# Patient Record
Sex: Male | Born: 1945
Health system: Southern US, Community
[De-identification: ages and names within clinical notes are randomized; demographics above are authoritative.]

## PROBLEM LIST (undated history)

## (undated) DIAGNOSIS — Z973 Presence of spectacles and contact lenses: Secondary | ICD-10-CM

## (undated) DIAGNOSIS — Z77098 Contact with and (suspected) exposure to other hazardous, chiefly nonmedicinal, chemicals: Secondary | ICD-10-CM

## (undated) DIAGNOSIS — F419 Anxiety disorder, unspecified: Secondary | ICD-10-CM

## (undated) DIAGNOSIS — K649 Unspecified hemorrhoids: Secondary | ICD-10-CM

## (undated) DIAGNOSIS — F32A Depression, unspecified: Secondary | ICD-10-CM

## (undated) DIAGNOSIS — H3530 Unspecified macular degeneration: Secondary | ICD-10-CM

## (undated) DIAGNOSIS — K219 Gastro-esophageal reflux disease without esophagitis: Secondary | ICD-10-CM

## (undated) DIAGNOSIS — K861 Other chronic pancreatitis: Secondary | ICD-10-CM

## (undated) DIAGNOSIS — D179 Benign lipomatous neoplasm, unspecified: Secondary | ICD-10-CM

## (undated) DIAGNOSIS — E785 Hyperlipidemia, unspecified: Secondary | ICD-10-CM

## (undated) DIAGNOSIS — E119 Type 2 diabetes mellitus without complications: Secondary | ICD-10-CM

## (undated) DIAGNOSIS — H35039 Hypertensive retinopathy, unspecified eye: Secondary | ICD-10-CM

## (undated) DIAGNOSIS — Z9189 Other specified personal risk factors, not elsewhere classified: Secondary | ICD-10-CM

## (undated) DIAGNOSIS — Z860101 Personal history of adenomatous and serrated colon polyps: Secondary | ICD-10-CM

## (undated) DIAGNOSIS — I10 Essential (primary) hypertension: Secondary | ICD-10-CM

## (undated) DIAGNOSIS — N401 Enlarged prostate with lower urinary tract symptoms: Secondary | ICD-10-CM

## (undated) DIAGNOSIS — C61 Malignant neoplasm of prostate: Secondary | ICD-10-CM

## (undated) DIAGNOSIS — F329 Major depressive disorder, single episode, unspecified: Secondary | ICD-10-CM

## (undated) DIAGNOSIS — Z86010 Personal history of colonic polyps: Secondary | ICD-10-CM

## (undated) DIAGNOSIS — C189 Malignant neoplasm of colon, unspecified: Secondary | ICD-10-CM

## (undated) DIAGNOSIS — Z794 Long term (current) use of insulin: Secondary | ICD-10-CM

## (undated) DIAGNOSIS — G629 Polyneuropathy, unspecified: Secondary | ICD-10-CM

## (undated) HISTORY — PX: COLONOSCOPY: SHX174

## (undated) HISTORY — DX: Contact with and (suspected) exposure to other hazardous, chiefly nonmedicinal, chemicals: Z77.098

## (undated) HISTORY — DX: Hypertensive retinopathy, unspecified eye: H35.039

## (undated) HISTORY — DX: Benign lipomatous neoplasm, unspecified: D17.9

## (undated) HISTORY — DX: Anxiety disorder, unspecified: F41.9

## (undated) HISTORY — DX: Hyperlipidemia, unspecified: E78.5

## (undated) HISTORY — PX: CATARACT EXTRACTION: SUR2

## (undated) HISTORY — PX: EYE SURGERY: SHX253

## (undated) HISTORY — DX: Gastro-esophageal reflux disease without esophagitis: K21.9

## (undated) HISTORY — DX: Polyneuropathy, unspecified: G62.9

## (undated) HISTORY — DX: Essential (primary) hypertension: I10

## (undated) HISTORY — DX: Unspecified macular degeneration: H35.30

## (undated) HISTORY — PX: PROSTATE BIOPSY: SHX241

---

## 1998-07-27 DIAGNOSIS — K861 Other chronic pancreatitis: Secondary | ICD-10-CM

## 1998-07-27 HISTORY — DX: Other chronic pancreatitis: K86.1

## 2004-07-27 HISTORY — PX: NASAL SEPTUM SURGERY: SHX37

## 2011-10-15 ENCOUNTER — Encounter: Payer: Self-pay | Admitting: Dietician

## 2011-10-15 ENCOUNTER — Encounter: Payer: BC Managed Care – PPO | Attending: Endocrinology | Admitting: Dietician

## 2011-10-15 DIAGNOSIS — Z713 Dietary counseling and surveillance: Secondary | ICD-10-CM | POA: Insufficient documentation

## 2011-10-15 DIAGNOSIS — E119 Type 2 diabetes mellitus without complications: Secondary | ICD-10-CM | POA: Insufficient documentation

## 2011-10-15 NOTE — Patient Instructions (Addendum)
   Use your food label when available.  Plan to practice measuring more of the fat servings and the carb servings.  Try to keep a record for 2 week days and one weekend day of your carb intake, insulin use, glucose levels and exercise.  Keep in a notebook or a spread sheet set-up.  This will help you get back to see what is really going on with your food and your insulin needs.  You can mail or e-mail me the counts and I will be happy to give you feedback.  Use the calorieking web site for counting carbs.  Find out if your pump needs to be re-programed, and what parameters that Dr. Talmage Nap feels you need installed.  Ms. Jenita Seashore CDE is our pump trainer and is familiar with your pump and could assist with helping you if needed.  [phone])  Get a dilated eye examination for a new baseline.  When getting the new program put into the pump, explore if the entries in your food data base can be up dated to better fit your current needs.  Look to keep your weight at 190-200 lb.  You need some cushion for illness should you have a problem.  I enjoyed working with you this morning.  Let me know if I can be of further assistance.  I apologize for not getting you this carb counting guide as I promise.  I'm including it in the envelope.  Take care, Anthony Icis Budreau, RN, RD, CDE

## 2011-10-15 NOTE — Progress Notes (Signed)
Medical Nutrition Therapy:  Appt start time: 0800 end time:  0900   Assessment:  Primary concerns today: Wanting to review carb counting and do some refresher work to help with better insulin use and glucose control. History of probable type 1.5 DM since 2000.   Has been on an insulin pump for a number of years.  Using an Elmo pump.  He is not sure of his current pump settings.  He is unaware of his insulin to carb ratio, he depends on the pump to provide the needed amount of insulin at basal rates and for bolus. Notes he currently is using about 40 units of Novolog per day.  He had an incident in the recent past where when he was filling the reservoir, during the priming phase, the pump rated accelerated at a "run away rate".  Animas replace his pump with a new pump, but he notes that his pump was never programed as was the first pump.  He reports that Dr. Talmage Nap related he was not getting enough insulin.  He feels he needs to have his pump programed to fit his current needs.  At [facility], Bev Paddock CDE can assist with getting his settings programed in if need be. It appears that he could benefit with having different programs to meet his changing level of activity. 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  He removes the pump while working or playing golf to prevent this from happening.   He comes to day for a review of carbohydrate counting with his wife. Reports that in the past, his endocrinologist in [place], had helped him to maintain his A1C at 6.5%.   His endocrinologist left the [place] and his new endocrinologist appeared to not care as much and was not as supportive, and he has let his diabetes slide. He is now ready and willing to work with Dr Talmage Nap to get back on track.  His wife is  is new in his life and has not been educated regarding diabetes and how it affects the patient.  Some time was spent catching her up on the diabetes process. Has increased  level of GERD.  Makes one wonder if he has some gastroparesis taking place. He reports peripheral neuropathy.  Has not had a dilated eye exam in the last 2 years.  Encouraged him to obtain a new baseline.  MEDICATIONS: Diabetes med is the Novolog insulin per his Animas Pump  DIETARY INTAKE:  24-hr recall:  B (8-9:00 AM): toast 1-2 slices, (c/o nausea in the morning and will eat 1 to 2 slices just so he can walk) variety of breads, usually plain, sometimes margarine.  Tea or coffee, plain with the sweet and low and non-dairy creamer.  Snk (mid AM) :fiber bar or sandwich of lettuce, tomato, balogona, or leftover meat and mayo.  L (late PM): Usually not unless working with friend.  Restaurant/fast food, [employer] roast beef with chedder cheese, mushrooms, [employer] sauce, curley fries, (medium) and a diet drink  " Uses more salt on his food than he probably should".  Snk (mid PM): 1/2 sandwich of leftover meat(meatloaf, fish, baked or fried) D (5:30-7:00 PM): chicken soup (healthey choice with mushrooms) salad with lettuce tomato,carrots.  Usually fried chicken (dark meat, leg and thigh)  Green beans1.5 cup, corn 1/2 cup, mash potatoes 1/2 cup, bread high fiber 1 slice. Snk (HS PM): diet soda or water Beverages: water, coffee, diet soda, tea.  No sugared beverages.  Recent physical activity: walks daily usually about 90 minutes when the weather is good.  Will cut it short on colder days.  Also active doing projects around the house and in the yard.  Likes to play golf as often as possible.  In the warmer months will play for 3-4 hours straight on 2-3 days per week.  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   Nutritional Diagnosis:  [place]-2.1 Inpaired nutrition utilization  As related to glucose.  As evidenced by diagnosis of type 1 diabetes, use of insulin pump and A1C of 7.7%.    Intervention:  Nutrition Completed a review of carbohydrate counting.  Emphasis placed on regularly going back to measure portions for accuracy.  Use the food label when available.  Use the web site calorieking.com for referencing.  Look to see if the carb counts stored in his pump from the fast food sites can be up dated.    Handouts given during visit include:  Living Well with Diabetes  Yellow card with his carb prescription based on his eating pattern and activity.  Booklet for Carbohydrate Counting.  Monitoring/Evaluation:  Dietary intake, exercise, blood glucose levels, and body weight as needed.Plan to e-mail me his carb counting records.  If he needs help with the programming his pump, we can arrange for an appointment with Ms Jenita Seashore CDE

## 2012-04-21 ENCOUNTER — Other Ambulatory Visit: Payer: Self-pay | Admitting: Internal Medicine

## 2012-04-21 DIAGNOSIS — R945 Abnormal results of liver function studies: Secondary | ICD-10-CM

## 2012-04-29 ENCOUNTER — Ambulatory Visit
Admission: RE | Admit: 2012-04-29 | Discharge: 2012-04-29 | Disposition: A | Discharge status: Home / Self Care | Payer: BC Managed Care – PPO | Source: Ambulatory Visit | Attending: Internal Medicine | Admitting: Internal Medicine

## 2012-04-29 DIAGNOSIS — R945 Abnormal results of liver function studies: Secondary | ICD-10-CM

## 2012-04-29 IMAGING — US US ABDOMEN LIMITED
1 series · 14 of 25 positions shown · non-contrast
Comparison: CT scan [DATE]

CLINICAL DATA: Abnormal LFTs

ABDOMEN ULTRASOUND LIMITED
TECHNIQUE: Right upper quadrant ultrasound

[Series 1: us abdomen limited · 0.20mm/px · 14 of 51 slices shown]
[im 1/51]
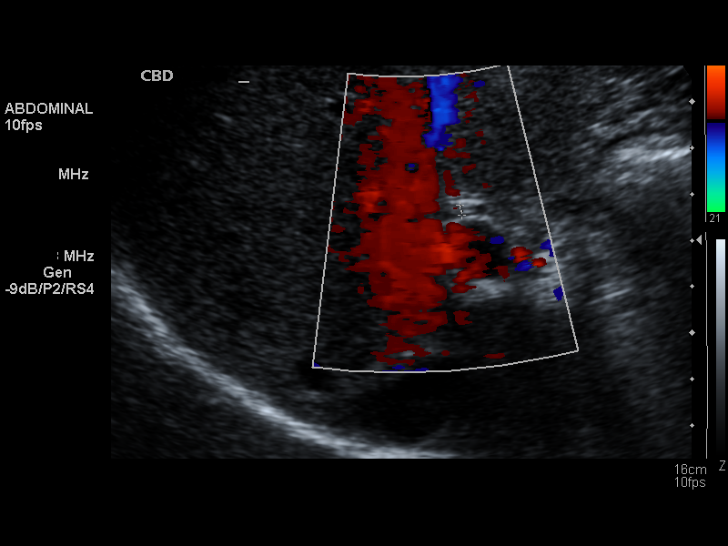
[im 5/51]
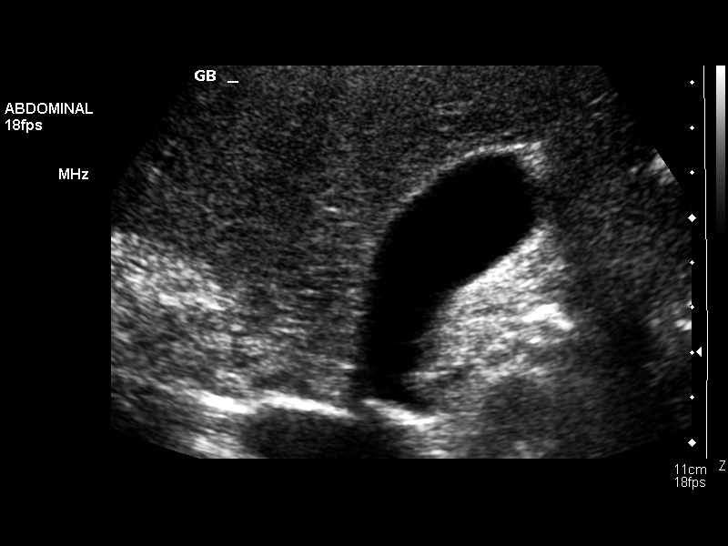
[im 9/51]
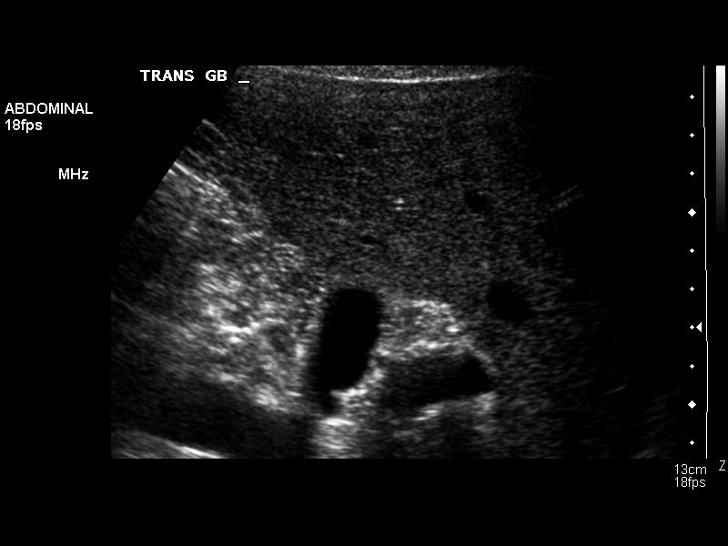
[im 13/51]
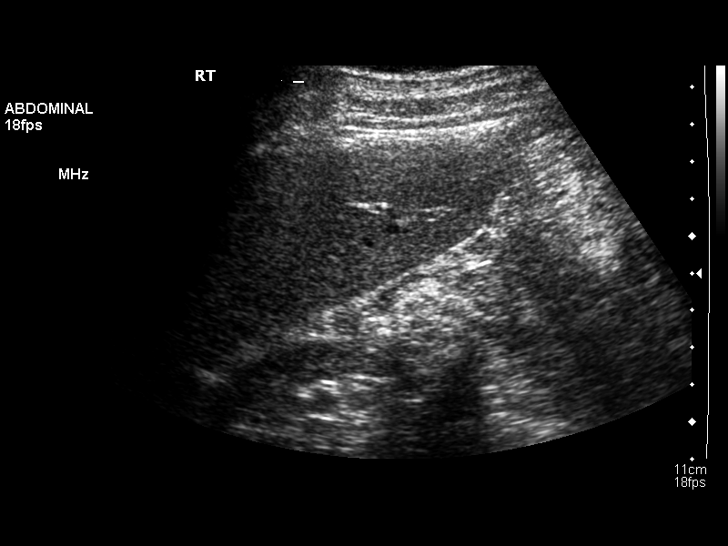
[im 17/51]
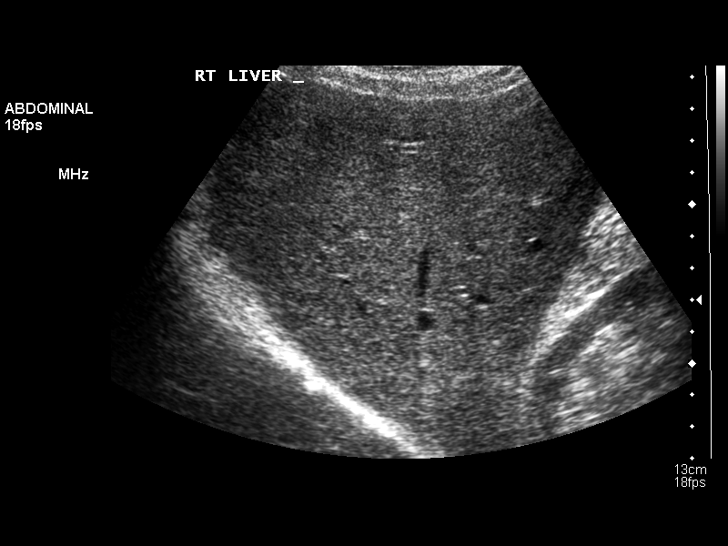
[im 19/51]
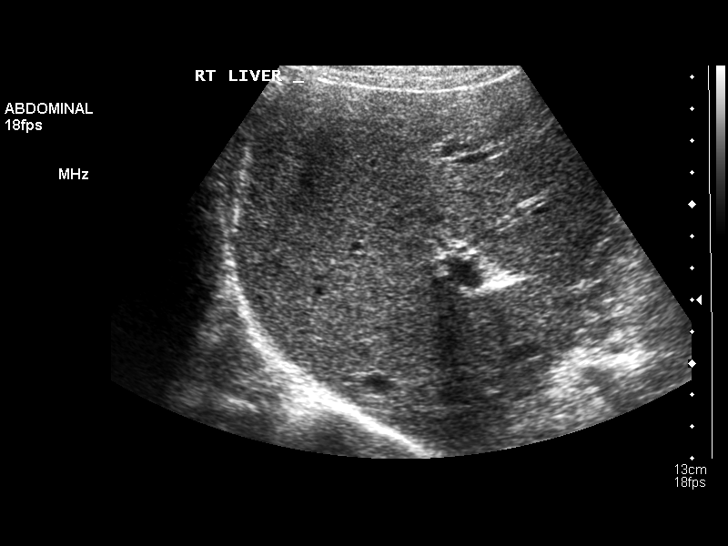
[im 23/51]
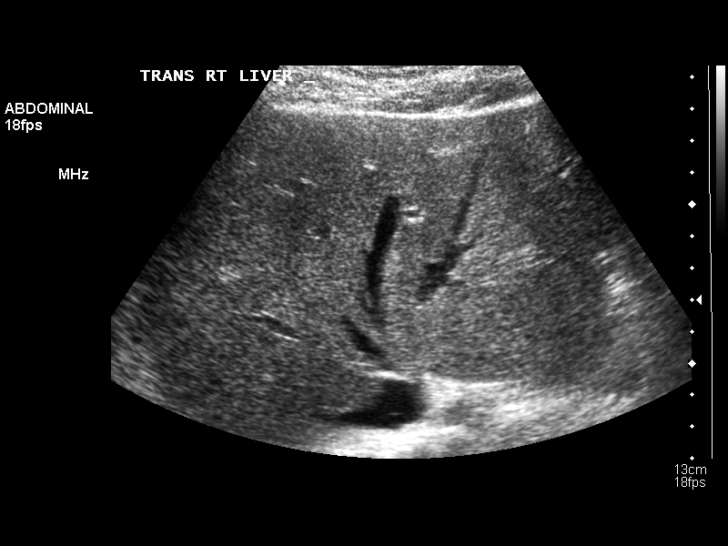
[im 28/51]
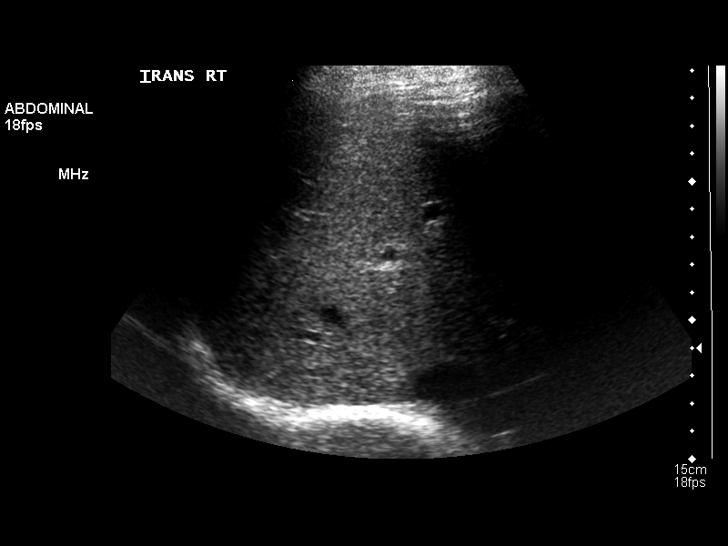
[im 32/51]
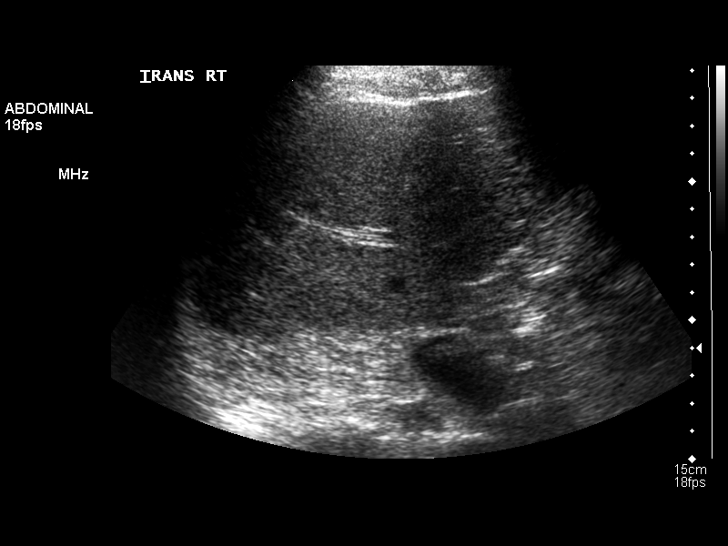
[im 34/51]
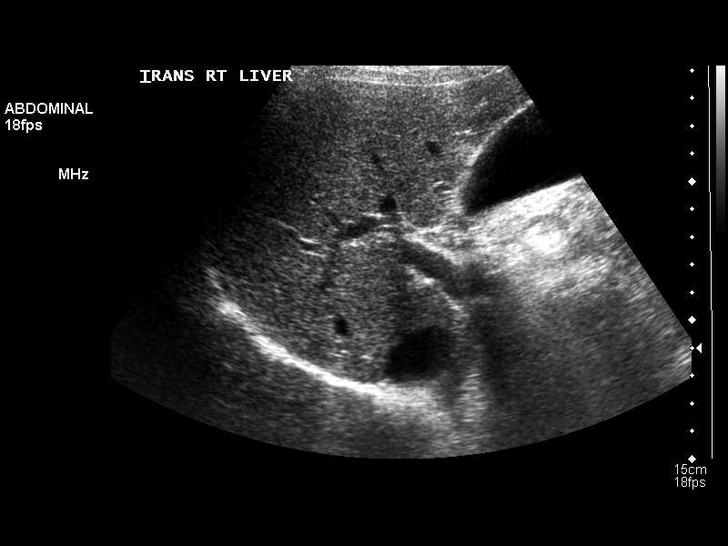
[im 38/51]
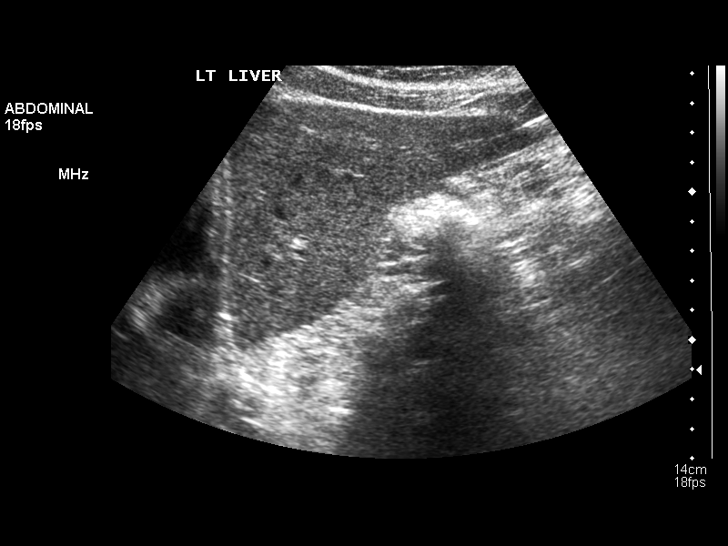
[im 42/51]
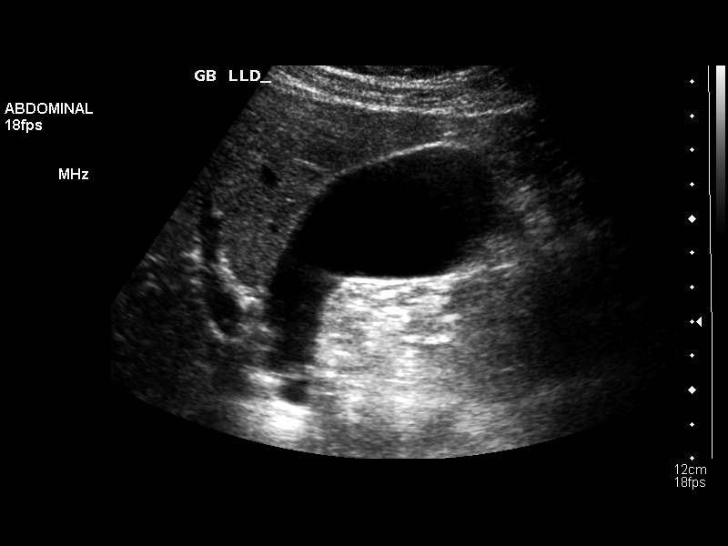
[im 46/51]
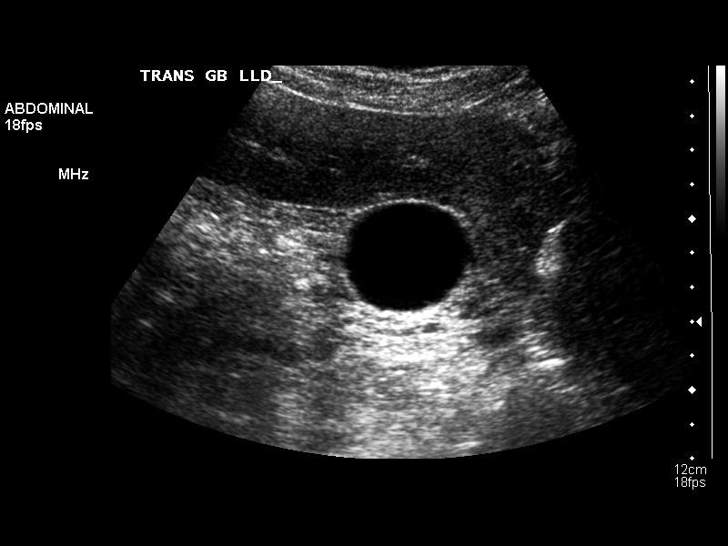
[im 51/51]
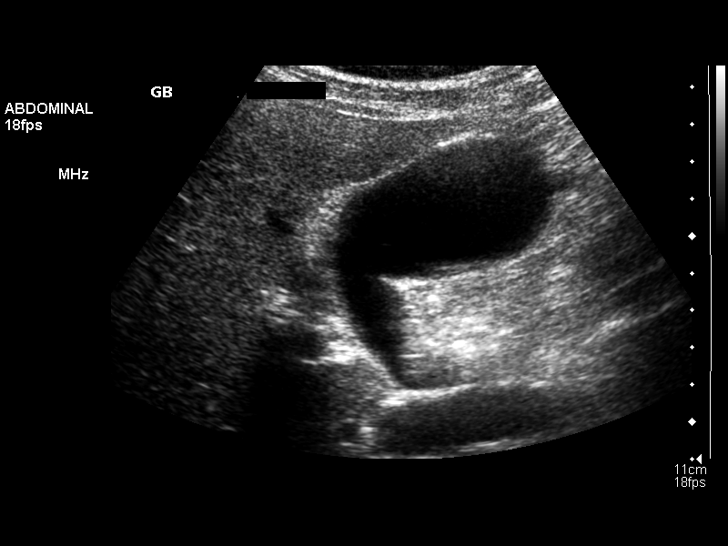

[14 of 25 positions shown; findings below may reference images not displayed]

FINDINGS: No gallstones are noted within gallbladder.  No
thickening of the gallbladder wall.  No sonographic Murphy's sign.

CBD is within normal limits measures 2.2 mm in diameter.

Mild increased echogenicity of the liver suspicious for mild
hepatic fatty infiltration.  No focal hepatic mass.  No
intrahepatic biliary ductal dilatation.
IMPRESSION: 1.  No gallstones are noted within gallbladder.  Normal CBD.  Mild
increased echogenicity of the liver suspicious for mild hepatic
fatty infiltration.

## 2013-06-13 DIAGNOSIS — E782 Mixed hyperlipidemia: Secondary | ICD-10-CM | POA: Diagnosis not present

## 2013-06-13 DIAGNOSIS — I10 Essential (primary) hypertension: Secondary | ICD-10-CM | POA: Diagnosis not present

## 2013-06-13 DIAGNOSIS — IMO0001 Reserved for inherently not codable concepts without codable children: Secondary | ICD-10-CM | POA: Diagnosis not present

## 2013-06-13 DIAGNOSIS — G609 Hereditary and idiopathic neuropathy, unspecified: Secondary | ICD-10-CM | POA: Diagnosis not present

## 2013-06-20 DIAGNOSIS — IMO0001 Reserved for inherently not codable concepts without codable children: Secondary | ICD-10-CM | POA: Diagnosis not present

## 2013-06-20 DIAGNOSIS — E782 Mixed hyperlipidemia: Secondary | ICD-10-CM | POA: Diagnosis not present

## 2013-06-20 DIAGNOSIS — I10 Essential (primary) hypertension: Secondary | ICD-10-CM | POA: Diagnosis not present

## 2013-06-20 DIAGNOSIS — K7689 Other specified diseases of liver: Secondary | ICD-10-CM | POA: Diagnosis not present

## 2013-07-31 DIAGNOSIS — E782 Mixed hyperlipidemia: Secondary | ICD-10-CM | POA: Diagnosis not present

## 2013-07-31 DIAGNOSIS — R252 Cramp and spasm: Secondary | ICD-10-CM | POA: Diagnosis not present

## 2013-07-31 DIAGNOSIS — I10 Essential (primary) hypertension: Secondary | ICD-10-CM | POA: Diagnosis not present

## 2013-07-31 DIAGNOSIS — IMO0001 Reserved for inherently not codable concepts without codable children: Secondary | ICD-10-CM | POA: Diagnosis not present

## 2013-08-29 DIAGNOSIS — E782 Mixed hyperlipidemia: Secondary | ICD-10-CM | POA: Diagnosis not present

## 2013-08-29 DIAGNOSIS — R252 Cramp and spasm: Secondary | ICD-10-CM | POA: Diagnosis not present

## 2013-09-05 ENCOUNTER — Other Ambulatory Visit: Payer: Self-pay | Admitting: Internal Medicine

## 2013-09-05 DIAGNOSIS — R7989 Other specified abnormal findings of blood chemistry: Secondary | ICD-10-CM

## 2013-09-05 DIAGNOSIS — E782 Mixed hyperlipidemia: Secondary | ICD-10-CM | POA: Diagnosis not present

## 2013-09-05 DIAGNOSIS — R945 Abnormal results of liver function studies: Secondary | ICD-10-CM

## 2013-09-05 DIAGNOSIS — K7689 Other specified diseases of liver: Secondary | ICD-10-CM | POA: Diagnosis not present

## 2013-09-05 DIAGNOSIS — R7401 Elevation of levels of liver transaminase levels: Secondary | ICD-10-CM | POA: Diagnosis not present

## 2013-09-05 DIAGNOSIS — R7402 Elevation of levels of lactic acid dehydrogenase (LDH): Secondary | ICD-10-CM | POA: Diagnosis not present

## 2013-09-12 ENCOUNTER — Inpatient Hospital Stay: Admission: RE | Admit: 2013-09-12 | Payer: BC Managed Care – PPO | Source: Ambulatory Visit

## 2013-09-13 DIAGNOSIS — R748 Abnormal levels of other serum enzymes: Secondary | ICD-10-CM | POA: Diagnosis not present

## 2013-09-13 DIAGNOSIS — K7689 Other specified diseases of liver: Secondary | ICD-10-CM | POA: Diagnosis not present

## 2013-09-13 DIAGNOSIS — IMO0001 Reserved for inherently not codable concepts without codable children: Secondary | ICD-10-CM | POA: Diagnosis not present

## 2013-09-14 ENCOUNTER — Ambulatory Visit
Admission: RE | Admit: 2013-09-14 | Discharge: 2013-09-14 | Disposition: A | Discharge status: Home / Self Care | Payer: PRIVATE HEALTH INSURANCE | Source: Ambulatory Visit | Attending: Internal Medicine | Admitting: Internal Medicine

## 2013-09-14 DIAGNOSIS — R748 Abnormal levels of other serum enzymes: Secondary | ICD-10-CM | POA: Diagnosis not present

## 2013-09-14 DIAGNOSIS — R7989 Other specified abnormal findings of blood chemistry: Secondary | ICD-10-CM

## 2013-09-14 DIAGNOSIS — R945 Abnormal results of liver function studies: Secondary | ICD-10-CM

## 2013-09-14 IMAGING — US US ABDOMEN COMPLETE
1 series · 14 of 25 positions shown · non-contrast
Comparison: [DATE]

CLINICAL DATA: Abnormal LFTs

EXAM:
ULTRASOUND ABDOMEN COMPLETE

[Series 1: us abdomen complete · 0.32mm/px · 14 of 72 slices shown]
[im 1/72]
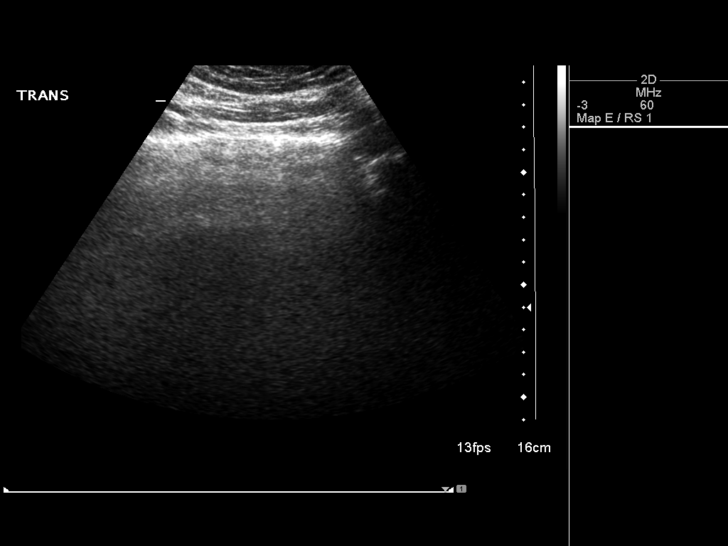
[im 6/72]
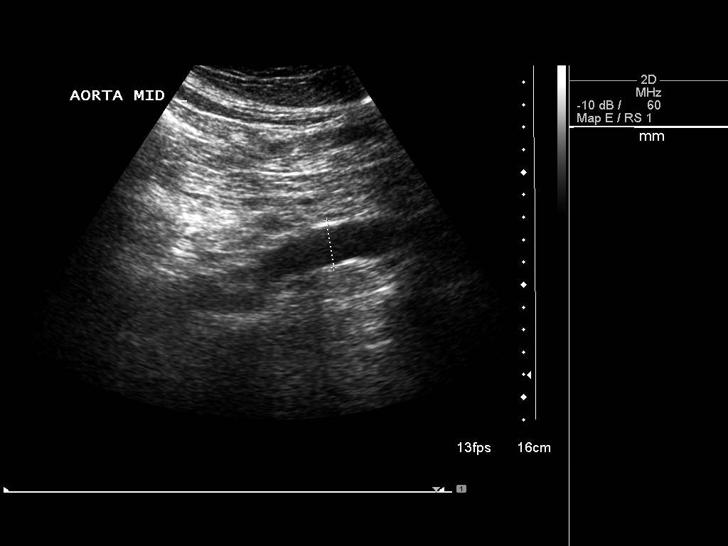
[im 12/72]
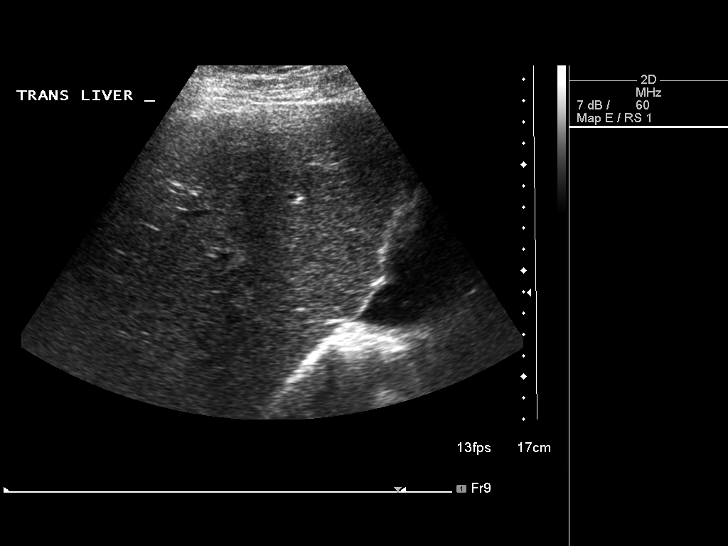
[im 18/72]
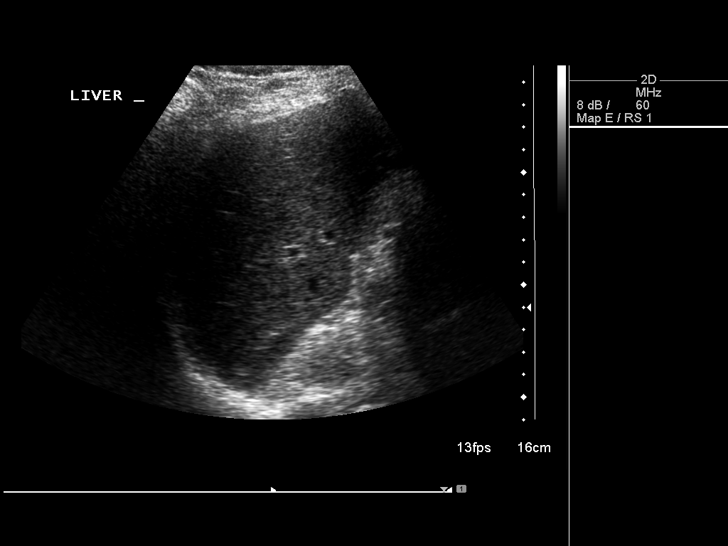
[im 24/72]
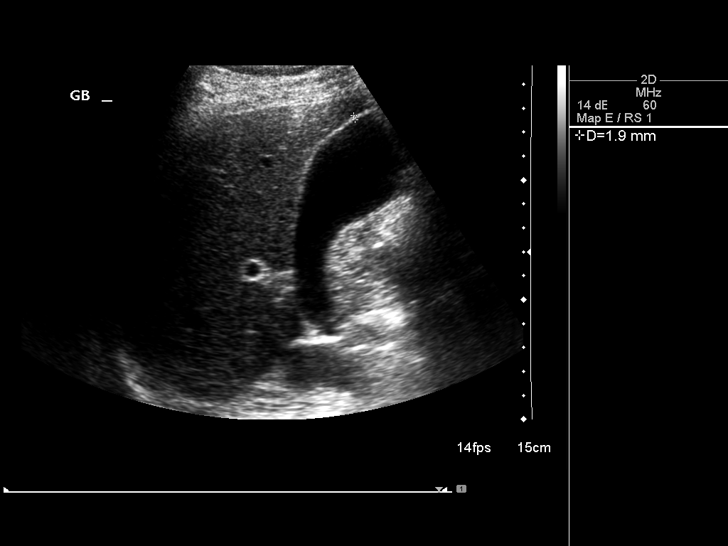
[im 27/72]
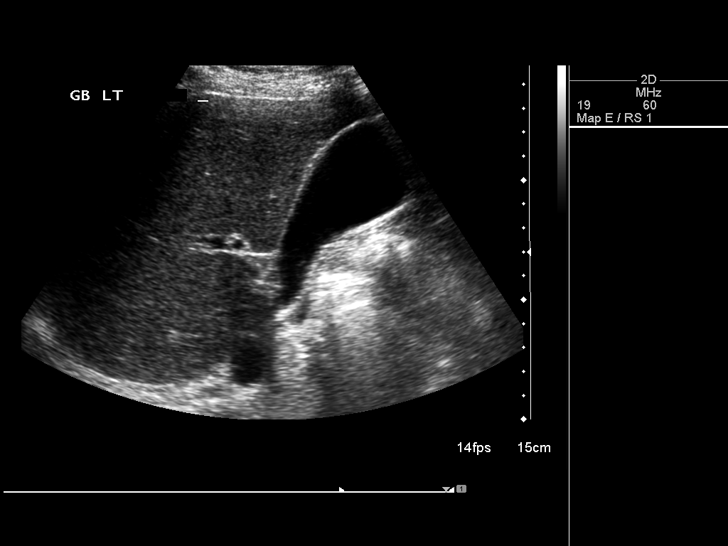
[im 33/72]
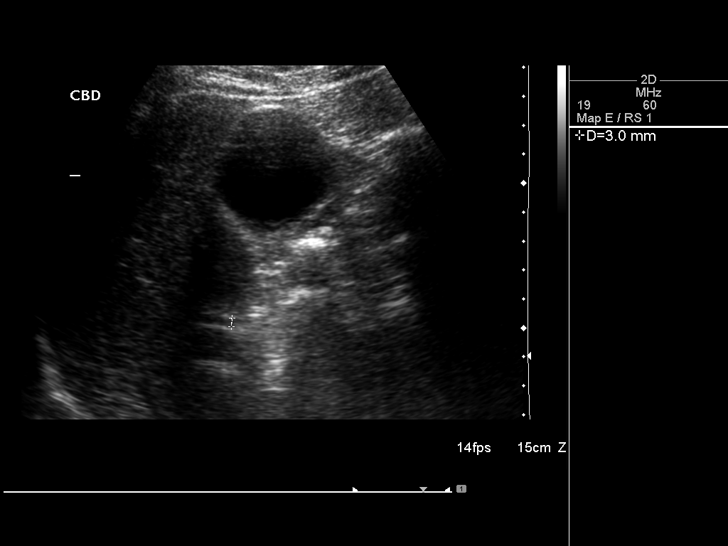
[im 39/72]
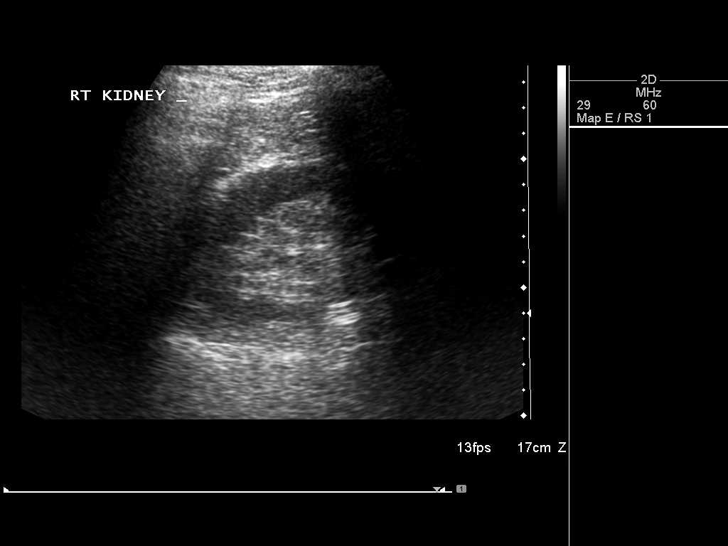
[im 45/72]
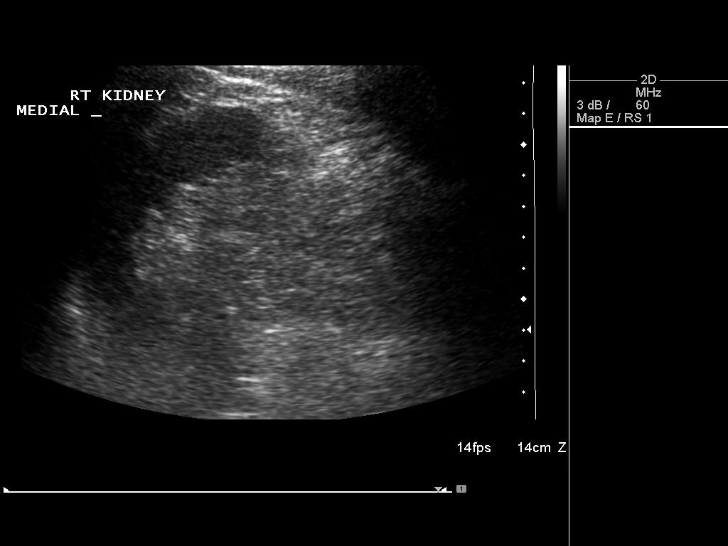
[im 48/72]
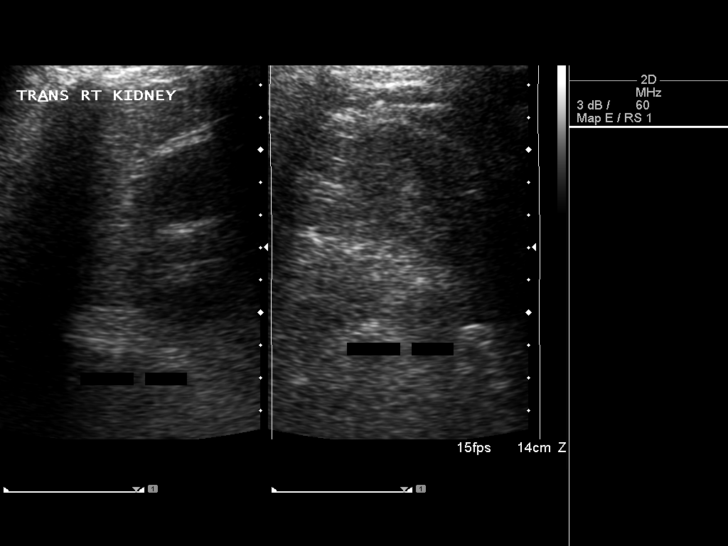
[im 54/72]
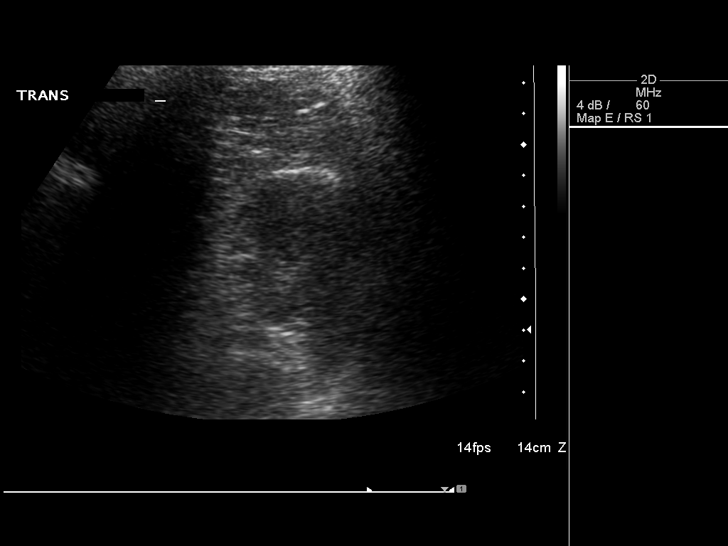
[im 60/72]
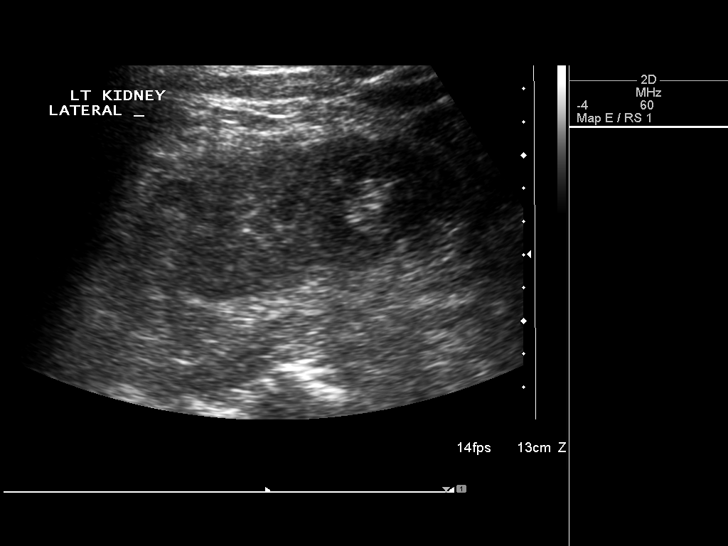
[im 66/72]
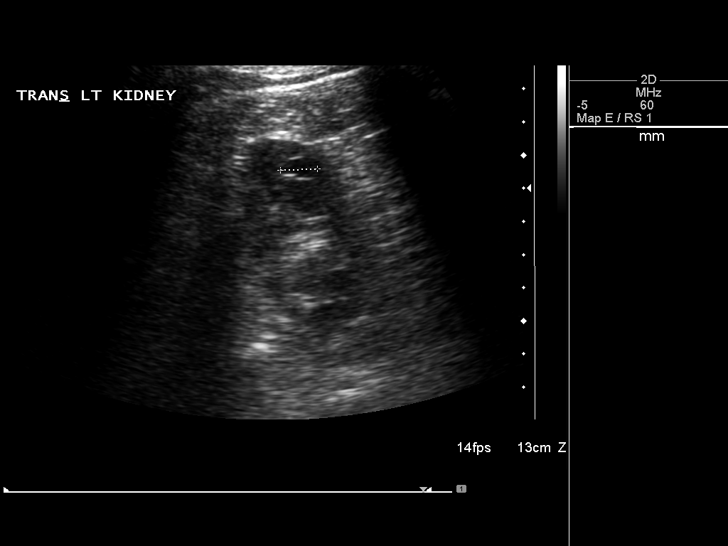
[im 72/72]
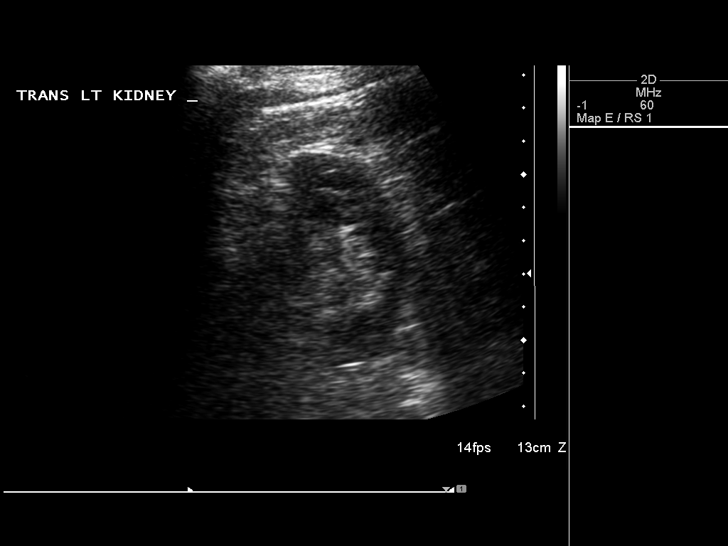

[14 of 25 positions shown; findings below may reference images not displayed]

FINDINGS: Gallbladder:

No gallstones or wall thickening visualized. No sonographic Murphy
sign noted.

Common bile duct:

Diameter: 3 mm

Liver:

Mildly echogenic consistent with fatty infiltration. Similar to that
seen on the prior exam.

IVC:

No acute abnormality is noted. Portions are obscured by overlying
bowel gas per

Pancreas:

Obscured by overlying bowel gas.

Spleen:

Size and appearance within normal limits.

Right Kidney:

Length: 10.4 cm.. Echogenicity within normal limits. No mass or
hydronephrosis visualized.

Left Kidney:

Length: 11.1 cm.. A small 1 cm cyst is again identified in the left
kidney. It is stable from the prior CT examination.

Abdominal aorta:

No aneurysm visualized.

Other findings:

None.
IMPRESSION: No acute abnormality noted.

## 2013-09-15 DIAGNOSIS — M129 Arthropathy, unspecified: Secondary | ICD-10-CM | POA: Diagnosis not present

## 2013-09-15 DIAGNOSIS — R748 Abnormal levels of other serum enzymes: Secondary | ICD-10-CM | POA: Diagnosis not present

## 2013-09-25 DIAGNOSIS — R7989 Other specified abnormal findings of blood chemistry: Secondary | ICD-10-CM | POA: Diagnosis not present

## 2013-09-25 DIAGNOSIS — R748 Abnormal levels of other serum enzymes: Secondary | ICD-10-CM | POA: Diagnosis not present

## 2013-09-25 DIAGNOSIS — K7689 Other specified diseases of liver: Secondary | ICD-10-CM | POA: Diagnosis not present

## 2013-10-30 DIAGNOSIS — K7689 Other specified diseases of liver: Secondary | ICD-10-CM | POA: Diagnosis not present

## 2013-11-06 DIAGNOSIS — R748 Abnormal levels of other serum enzymes: Secondary | ICD-10-CM | POA: Diagnosis not present

## 2013-11-06 DIAGNOSIS — E782 Mixed hyperlipidemia: Secondary | ICD-10-CM | POA: Diagnosis not present

## 2013-11-06 DIAGNOSIS — I10 Essential (primary) hypertension: Secondary | ICD-10-CM | POA: Diagnosis not present

## 2013-11-06 DIAGNOSIS — K7689 Other specified diseases of liver: Secondary | ICD-10-CM | POA: Diagnosis not present

## 2013-11-06 DIAGNOSIS — Z23 Encounter for immunization: Secondary | ICD-10-CM | POA: Diagnosis not present

## 2013-12-15 DIAGNOSIS — H5200 Hypermetropia, unspecified eye: Secondary | ICD-10-CM | POA: Diagnosis not present

## 2013-12-15 DIAGNOSIS — H04129 Dry eye syndrome of unspecified lacrimal gland: Secondary | ICD-10-CM | POA: Diagnosis not present

## 2013-12-15 DIAGNOSIS — H40039 Anatomical narrow angle, unspecified eye: Secondary | ICD-10-CM | POA: Diagnosis not present

## 2014-01-02 DIAGNOSIS — Z23 Encounter for immunization: Secondary | ICD-10-CM | POA: Diagnosis not present

## 2014-02-05 DIAGNOSIS — G609 Hereditary and idiopathic neuropathy, unspecified: Secondary | ICD-10-CM | POA: Diagnosis not present

## 2014-02-05 DIAGNOSIS — IMO0001 Reserved for inherently not codable concepts without codable children: Secondary | ICD-10-CM | POA: Diagnosis not present

## 2014-02-05 DIAGNOSIS — I10 Essential (primary) hypertension: Secondary | ICD-10-CM | POA: Diagnosis not present

## 2014-03-20 DIAGNOSIS — K7689 Other specified diseases of liver: Secondary | ICD-10-CM | POA: Diagnosis not present

## 2014-03-20 DIAGNOSIS — IMO0001 Reserved for inherently not codable concepts without codable children: Secondary | ICD-10-CM | POA: Diagnosis not present

## 2014-03-20 DIAGNOSIS — M25579 Pain in unspecified ankle and joints of unspecified foot: Secondary | ICD-10-CM | POA: Diagnosis not present

## 2014-03-20 DIAGNOSIS — M79609 Pain in unspecified limb: Secondary | ICD-10-CM | POA: Diagnosis not present

## 2014-04-09 DIAGNOSIS — Z0000 Encounter for general adult medical examination without abnormal findings: Secondary | ICD-10-CM | POA: Diagnosis not present

## 2014-04-09 DIAGNOSIS — Z125 Encounter for screening for malignant neoplasm of prostate: Secondary | ICD-10-CM | POA: Diagnosis not present

## 2014-04-09 DIAGNOSIS — IMO0001 Reserved for inherently not codable concepts without codable children: Secondary | ICD-10-CM | POA: Diagnosis not present

## 2014-04-09 DIAGNOSIS — E782 Mixed hyperlipidemia: Secondary | ICD-10-CM | POA: Diagnosis not present

## 2014-04-09 DIAGNOSIS — I10 Essential (primary) hypertension: Secondary | ICD-10-CM | POA: Diagnosis not present

## 2014-04-16 DIAGNOSIS — E782 Mixed hyperlipidemia: Secondary | ICD-10-CM | POA: Diagnosis not present

## 2014-04-16 DIAGNOSIS — IMO0001 Reserved for inherently not codable concepts without codable children: Secondary | ICD-10-CM | POA: Diagnosis not present

## 2014-04-16 DIAGNOSIS — I10 Essential (primary) hypertension: Secondary | ICD-10-CM | POA: Diagnosis not present

## 2014-04-16 DIAGNOSIS — K7689 Other specified diseases of liver: Secondary | ICD-10-CM | POA: Diagnosis not present

## 2014-06-18 DIAGNOSIS — E1165 Type 2 diabetes mellitus with hyperglycemia: Secondary | ICD-10-CM | POA: Diagnosis not present

## 2014-06-18 DIAGNOSIS — R749 Abnormal serum enzyme level, unspecified: Secondary | ICD-10-CM | POA: Diagnosis not present

## 2014-06-18 DIAGNOSIS — I10 Essential (primary) hypertension: Secondary | ICD-10-CM | POA: Diagnosis not present

## 2014-07-02 DIAGNOSIS — K7689 Other specified diseases of liver: Secondary | ICD-10-CM | POA: Diagnosis not present

## 2014-07-09 DIAGNOSIS — I10 Essential (primary) hypertension: Secondary | ICD-10-CM | POA: Diagnosis not present

## 2014-07-09 DIAGNOSIS — E782 Mixed hyperlipidemia: Secondary | ICD-10-CM | POA: Diagnosis not present

## 2014-07-09 DIAGNOSIS — K760 Fatty (change of) liver, not elsewhere classified: Secondary | ICD-10-CM | POA: Diagnosis not present

## 2014-07-09 DIAGNOSIS — Z23 Encounter for immunization: Secondary | ICD-10-CM | POA: Diagnosis not present

## 2014-07-09 DIAGNOSIS — R749 Abnormal serum enzyme level, unspecified: Secondary | ICD-10-CM | POA: Diagnosis not present

## 2014-10-01 DIAGNOSIS — G609 Hereditary and idiopathic neuropathy, unspecified: Secondary | ICD-10-CM | POA: Diagnosis not present

## 2014-10-01 DIAGNOSIS — I10 Essential (primary) hypertension: Secondary | ICD-10-CM | POA: Diagnosis not present

## 2014-10-01 DIAGNOSIS — E780 Pure hypercholesterolemia: Secondary | ICD-10-CM | POA: Diagnosis not present

## 2014-10-01 DIAGNOSIS — E1165 Type 2 diabetes mellitus with hyperglycemia: Secondary | ICD-10-CM | POA: Diagnosis not present

## 2014-10-11 DIAGNOSIS — H3531 Nonexudative age-related macular degeneration: Secondary | ICD-10-CM | POA: Diagnosis not present

## 2014-10-11 DIAGNOSIS — H25019 Cortical age-related cataract, unspecified eye: Secondary | ICD-10-CM | POA: Diagnosis not present

## 2014-10-11 DIAGNOSIS — H04123 Dry eye syndrome of bilateral lacrimal glands: Secondary | ICD-10-CM | POA: Diagnosis not present

## 2014-10-11 DIAGNOSIS — H40033 Anatomical narrow angle, bilateral: Secondary | ICD-10-CM | POA: Diagnosis not present

## 2014-10-16 ENCOUNTER — Encounter (INDEPENDENT_AMBULATORY_CARE_PROVIDER_SITE_OTHER): Payer: Self-pay | Admitting: Ophthalmology

## 2014-10-16 ENCOUNTER — Encounter (INDEPENDENT_AMBULATORY_CARE_PROVIDER_SITE_OTHER): Payer: Medicare Other | Admitting: Ophthalmology

## 2014-10-16 DIAGNOSIS — H31002 Unspecified chorioretinal scars, left eye: Secondary | ICD-10-CM | POA: Diagnosis not present

## 2014-10-16 DIAGNOSIS — H2513 Age-related nuclear cataract, bilateral: Secondary | ICD-10-CM

## 2014-10-16 DIAGNOSIS — H35033 Hypertensive retinopathy, bilateral: Secondary | ICD-10-CM | POA: Diagnosis not present

## 2014-10-16 DIAGNOSIS — H3531 Nonexudative age-related macular degeneration: Secondary | ICD-10-CM

## 2014-10-16 DIAGNOSIS — H43813 Vitreous degeneration, bilateral: Secondary | ICD-10-CM | POA: Diagnosis not present

## 2014-10-16 DIAGNOSIS — I10 Essential (primary) hypertension: Secondary | ICD-10-CM | POA: Diagnosis not present

## 2014-10-29 DIAGNOSIS — Z86010 Personal history of colonic polyps: Secondary | ICD-10-CM | POA: Diagnosis not present

## 2014-10-29 DIAGNOSIS — R740 Nonspecific elevation of levels of transaminase and lactic acid dehydrogenase [LDH]: Secondary | ICD-10-CM | POA: Diagnosis not present

## 2014-12-03 DIAGNOSIS — R740 Nonspecific elevation of levels of transaminase and lactic acid dehydrogenase [LDH]: Secondary | ICD-10-CM | POA: Diagnosis not present

## 2014-12-18 DIAGNOSIS — E782 Mixed hyperlipidemia: Secondary | ICD-10-CM | POA: Diagnosis not present

## 2014-12-25 DIAGNOSIS — E780 Pure hypercholesterolemia: Secondary | ICD-10-CM | POA: Diagnosis not present

## 2014-12-25 DIAGNOSIS — R749 Abnormal serum enzyme level, unspecified: Secondary | ICD-10-CM | POA: Diagnosis not present

## 2014-12-25 DIAGNOSIS — E1165 Type 2 diabetes mellitus with hyperglycemia: Secondary | ICD-10-CM | POA: Diagnosis not present

## 2014-12-25 DIAGNOSIS — I10 Essential (primary) hypertension: Secondary | ICD-10-CM | POA: Diagnosis not present

## 2015-01-10 DIAGNOSIS — G609 Hereditary and idiopathic neuropathy, unspecified: Secondary | ICD-10-CM | POA: Diagnosis not present

## 2015-01-10 DIAGNOSIS — E780 Pure hypercholesterolemia: Secondary | ICD-10-CM | POA: Diagnosis not present

## 2015-01-10 DIAGNOSIS — I10 Essential (primary) hypertension: Secondary | ICD-10-CM | POA: Diagnosis not present

## 2015-01-10 DIAGNOSIS — E1165 Type 2 diabetes mellitus with hyperglycemia: Secondary | ICD-10-CM | POA: Diagnosis not present

## 2015-01-21 DIAGNOSIS — R740 Nonspecific elevation of levels of transaminase and lactic acid dehydrogenase [LDH]: Secondary | ICD-10-CM | POA: Diagnosis not present

## 2015-01-21 DIAGNOSIS — Z86010 Personal history of colonic polyps: Secondary | ICD-10-CM | POA: Diagnosis not present

## 2015-02-28 DIAGNOSIS — C187 Malignant neoplasm of sigmoid colon: Secondary | ICD-10-CM | POA: Diagnosis not present

## 2015-02-28 DIAGNOSIS — K921 Melena: Secondary | ICD-10-CM | POA: Diagnosis not present

## 2015-03-05 DIAGNOSIS — H40033 Anatomical narrow angle, bilateral: Secondary | ICD-10-CM | POA: Diagnosis not present

## 2015-03-07 ENCOUNTER — Other Ambulatory Visit: Payer: Self-pay | Admitting: General Surgery

## 2015-03-07 DIAGNOSIS — C189 Malignant neoplasm of colon, unspecified: Secondary | ICD-10-CM

## 2015-03-07 DIAGNOSIS — C799 Secondary malignant neoplasm of unspecified site: Secondary | ICD-10-CM

## 2015-03-07 DIAGNOSIS — C187 Malignant neoplasm of sigmoid colon: Secondary | ICD-10-CM | POA: Diagnosis not present

## 2015-03-07 NOTE — H&P (Signed)
Anthony [name] 03/07/2015 1:52 PM Location: [facility] Patient #: [id number] DOB: 17-Jul-1946 Married / Language: English / Race: Black or African American Male History of Present Illness Anthony Ruff MD; 02/03/6268 2:16 PM) Patient words: colon ca.  The patient is a 69 year old male who presents with colorectal cancer. 69 year old male who presents to [place] as a referral from Dr. Amedeo Plenty. He underwent a colonoscopy due to rectal bleeding. On exam a 30 mm polyp was found in the proximal sigmoid colon. This was semi-sessile and removed using piecemeal technique with snare cautery. The polyp resection was incomplete. The area was tattooed with [place] ink. Biopsies showed adenocarcinoma. The patient denies any weight loss. He is currently not having any rectal bleeding. He denies any abdominal pain. He has no family history of colon cancer. Patient is also being treated but Dr. Amedeo Plenty for elevated liver enzymes. These are thought to be due to his history of alcohol use. The patient is also a type II diabetic and uses insulin to manage this at home. Other Problems Mammie Lorenzo, LPN; 4/85/4627 0:35 PM) Diabetes Mellitus Gastroesophageal Reflux Disease High blood pressure Hypercholesterolemia Pancreatitis  Past Surgical History Mammie Lorenzo, LPN; 0/03/3817 2:99 PM) Oral Surgery  Allergies Mammie Lorenzo, LPN; 3/71/6967 8:93 PM) Toradol *ANALGESICS - ANTI-INFLAMMATORY*  Medication History Mammie Lorenzo, LPN; 03/05/1750 0:25 PM) Neurontin (400MG  Capsule, Oral) Active. Lotrel (10-20MG  Capsule, Oral) Active. NovoLIN N (100UNIT/ML Suspension, Subcutaneous) Active. Protonix (40MG  Packet, Oral) Active. Medications Reconciled  Social History Mammie Lorenzo, LPN; 8/52/7782 4:23 PM) Alcohol use Moderate alcohol use. Caffeine use Carbonated beverages. No drug use Tobacco use Former smoker.  Family History Mammie Lorenzo, LPN; 5/36/1443 1:54 PM) Arthritis  Mother. Hypertension Father. Prostate Cancer Father.     Review of Systems Claiborne Billings [facility] LPN; 0/02/6760 9:50 PM) General Not Present- Appetite Loss, Chills, Fatigue, Fever, Night Sweats, Weight Gain and Weight Loss. Skin Not Present- Change in Wart/Mole, Dryness, Hives, Jaundice, New Lesions, Non-Healing Wounds, Rash and Ulcer. HEENT Present- Ringing in the Ears and Seasonal Allergies. Not Present- Earache, Hearing Loss, Hoarseness, Nose Bleed, Oral Ulcers, Sinus Pain, Sore Throat, Visual Disturbances, Wears glasses/contact lenses and Yellow Eyes. Respiratory Not Present- Bloody sputum, Chronic Cough, Difficulty Breathing, Snoring and Wheezing. Cardiovascular Not Present- Chest Pain, Difficulty Breathing Lying Down, Leg Cramps, Palpitations, Rapid Heart Rate, Shortness of Breath and Swelling of Extremities. Gastrointestinal Not Present- Abdominal Pain, Bloating, Bloody Stool, Change in Bowel Habits, Chronic diarrhea, Constipation, Difficulty Swallowing, Excessive gas, Gets full quickly at meals, Hemorrhoids, Indigestion, Nausea, Rectal Pain and Vomiting. Male Genitourinary Present- Nocturia. Not Present- Blood in Urine, Change in Urinary Stream, Frequency, Impotence, Painful Urination, Urgency and Urine Leakage.  Vitals Claiborne Billings [facility]ery LPN; 9/32/6712 4:58 PM) 03/07/2015 1:53 PM Weight: 200.2 lb Height: 74in Body Surface Area: 2.18 m Body Mass Index: 25.7 kg/m Temp.: 98.97F(Oral)  Pulse: 85 (Regular)  BP: 130/80 (Sitting, Left Arm, Standard)     Physical Exam Anthony Ruff MD; 0/99/8338 2:16 PM)  General Mental Status-Alert. General Appearance-Consistent with stated age. Hydration-Well hydrated. Voice-Normal.  Head and Neck Head-normocephalic, atraumatic with no lesions or palpable masses. Trachea-midline. Thyroid Gland Characteristics - normal size and consistency.  Eye Eyeball - Bilateral-Extraocular movements intact. Sclera/Conjunctiva -  Bilateral-No scleral icterus.  Chest and Lung Exam Chest and lung exam reveals -quiet, even and easy respiratory effort with no use of accessory muscles and on auscultation, normal breath sounds, no adventitious sounds and normal vocal resonance. Inspection Chest Wall - Normal. Back - normal.  Cardiovascular Cardiovascular  examination reveals -normal heart sounds, regular rate and rhythm with no murmurs and normal pedal pulses bilaterally.  Abdomen Inspection Inspection of the abdomen reveals - No Hernias. Palpation/Percussion Palpation and Percussion of the abdomen reveal - Soft, Non Tender, No Rebound tenderness, No Rigidity (guarding) and No hepatosplenomegaly. Auscultation Auscultation of the abdomen reveals - Bowel sounds normal.  Neurologic Neurologic evaluation reveals -alert and oriented x 3 with no impairment of recent or remote memory. Mental Status-Normal.  Musculoskeletal Global Assessment -Note:no gross deformities.  Normal Exam - Left-Upper Extremity Strength Normal and Lower Extremity Strength Normal. Normal Exam - Right-Upper Extremity Strength Normal and Lower Extremity Strength Normal.    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 Amedeo Plenty for rectal bleeding. This showed a sigmoid mass that was resected piecemeal. The base of the mass was biopsied as well. Both of these show adenocarcinoma. The area was tattooed. I have recommended colonic resection. This can be done in a minimally invasive fashion. We discussed this in detail today. All questions were answered. We will get CT scans of his chest abdomen and pelvis to evaluate for any metastatic disease. I will also have the preoperative nursing unit draw a CEA with his preoperative labs for baseline level. The surgery and anatomy were described to the patient as well as  the risks of surgery and the possible complications. These include: Bleeding, deep abdominal infections and possible wound complications such as hernia and infection, damage to adjacent structures, leak of surgical connections, which can lead to other surgeries and possibly an ostomy, possible need for other procedures, such as abscess drains in radiology, possible prolonged hospital stay, possible diarrhea from removal of part of the colon, possible constipation from narcotics, prolonged fatigue/weakness or appetite loss, possible early recurrence of of disease, possible complications of their medical problems such as heart disease or arrhythmias or lung problems, death (less than 1%). I believe the patient understands and wishes to proceed with the surgery.

## 2015-03-14 ENCOUNTER — Ambulatory Visit
Admission: RE | Admit: 2015-03-14 | Discharge: 2015-03-14 | Disposition: A | Discharge status: Home / Self Care | Payer: PRIVATE HEALTH INSURANCE | Source: Ambulatory Visit | Attending: General Surgery | Admitting: General Surgery

## 2015-03-14 ENCOUNTER — Other Ambulatory Visit: Payer: PRIVATE HEALTH INSURANCE

## 2015-03-14 ENCOUNTER — Other Ambulatory Visit: Payer: Self-pay | Admitting: Pediatrics

## 2015-03-14 DIAGNOSIS — C187 Malignant neoplasm of sigmoid colon: Secondary | ICD-10-CM | POA: Diagnosis not present

## 2015-03-14 DIAGNOSIS — I517 Cardiomegaly: Secondary | ICD-10-CM | POA: Diagnosis not present

## 2015-03-14 DIAGNOSIS — K861 Other chronic pancreatitis: Secondary | ICD-10-CM | POA: Diagnosis not present

## 2015-03-14 DIAGNOSIS — I2510 Atherosclerotic heart disease of native coronary artery without angina pectoris: Secondary | ICD-10-CM | POA: Diagnosis not present

## 2015-03-14 IMAGING — CT CT ABD-PELV W/ CM
3 of 4 series · 9 of 46 positions shown, 14 images · IV contrast (APPLIED)
Comparison: Abdominal ultrasound [DATE]. Abdominal pelvic CT of
[DATE].

CLINICAL DATA: Adenocarcinoma of the sigmoid colon. Blood in stool
for 1 month. Ex-smoker.

EXAM:
CT CHEST, ABDOMEN, AND PELVIS WITH CONTRAST
TECHNIQUE: Multidetector CT imaging of the chest, abdomen and pelvis was
performed following the standard protocol during bolus
administration of intravenous contrast.
CONTRAST:  125mL [QT] IOPAMIDOL ([QT]) INJECTION 61%

[Series 3: cor · coronal · 0.82mm/px · 3 of 86 slices shown]
[im 29/86  soft-tissue]
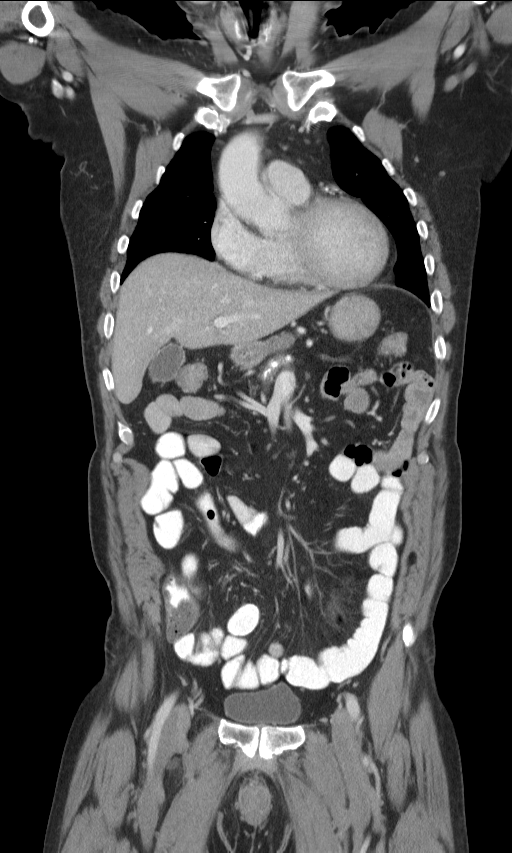
[im 38/86  soft-tissue]
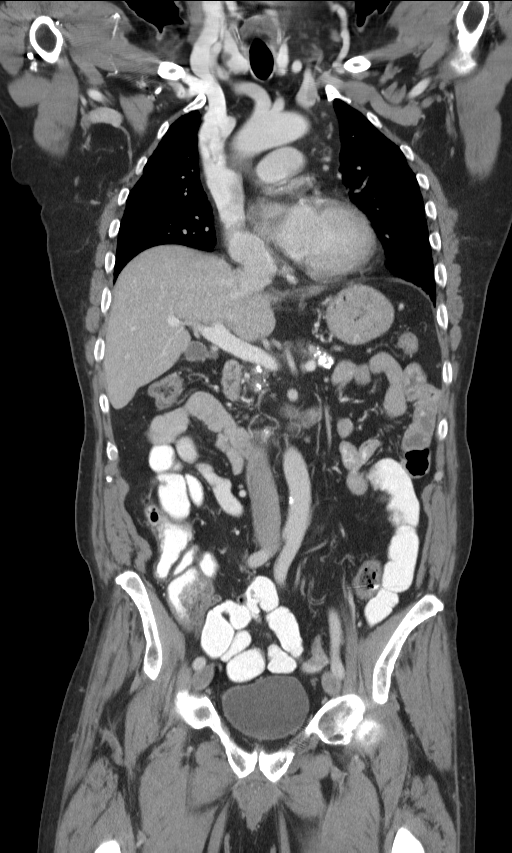
[im 48/86  soft-tissue]
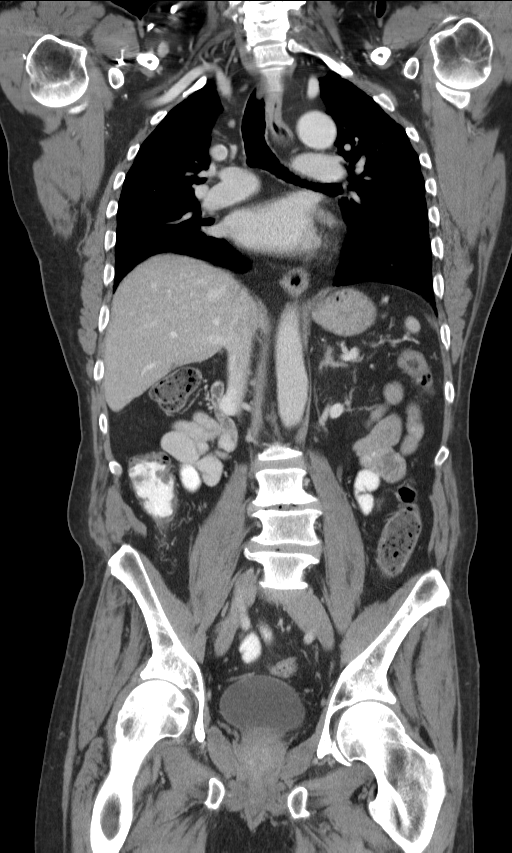

[Series 4: sag · sagittal · 0.74mm/px · 1 of 118 slices shown]
[im 40/118  soft-tissue]
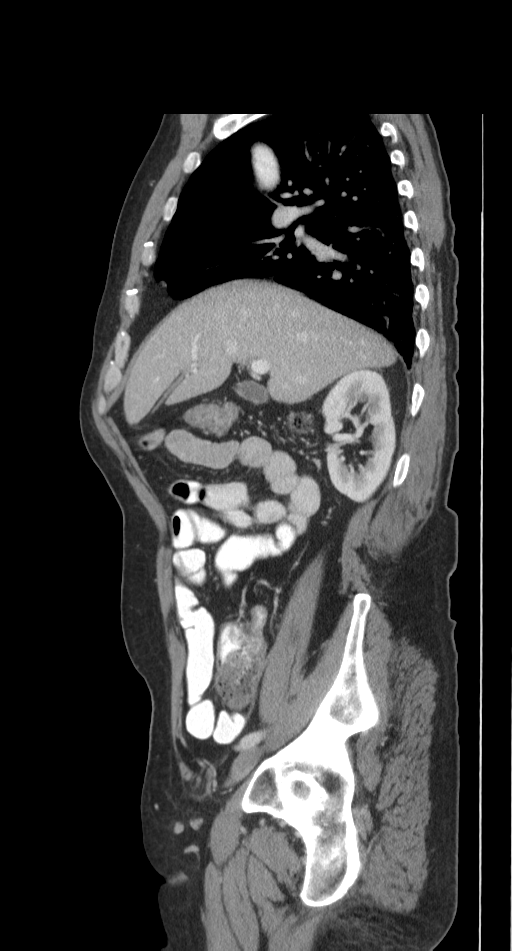

[Series 7: renal delay · axial · delayed · 0.75mm/px · z∈[-440,-314]mm · 5 of 40 slices shown, 10 images]
[im 7/40  soft-tissue]
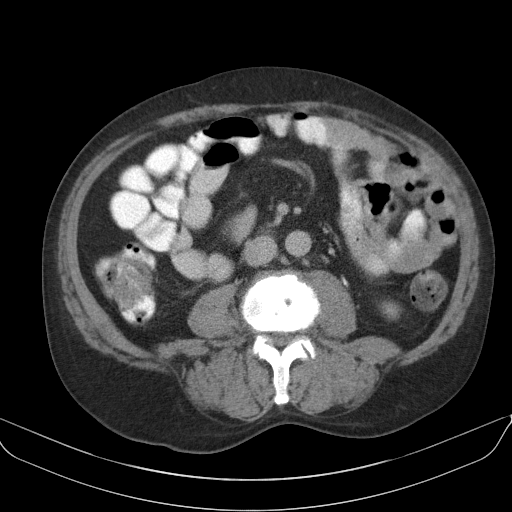
[im 7/40  bone]
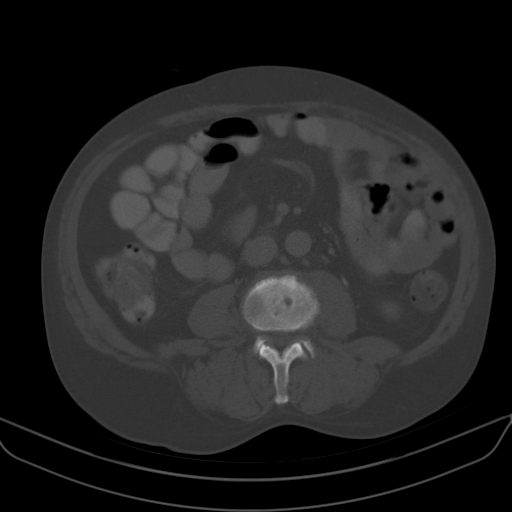
[im 14/40  soft-tissue]
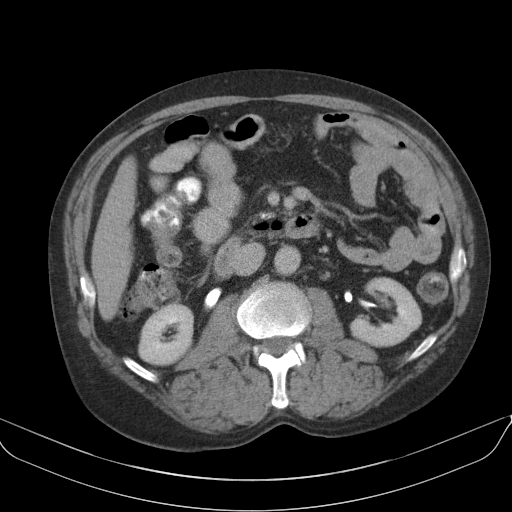
[im 14/40  lung]
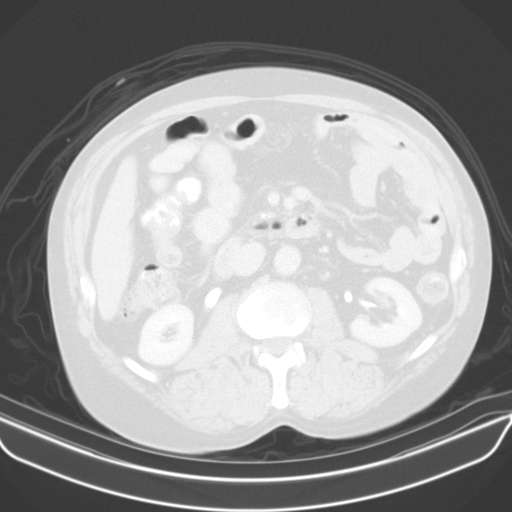
[im 20/40  soft-tissue]
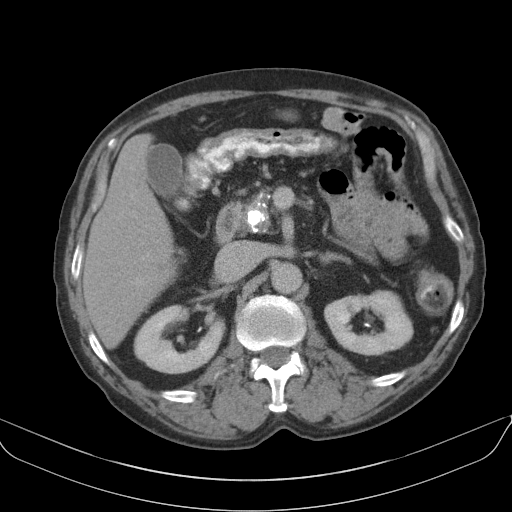
[im 20/40  lung]
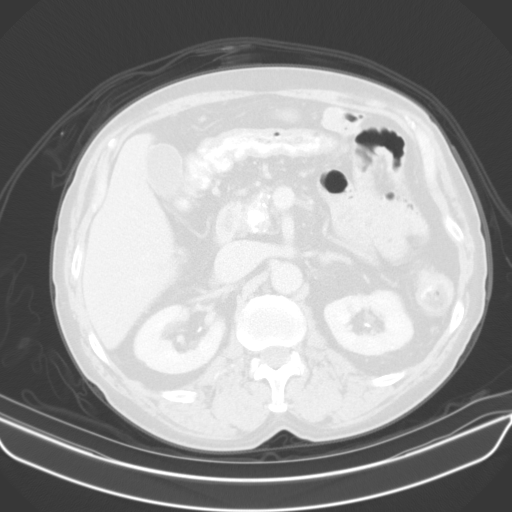
[im 27/40  soft-tissue]
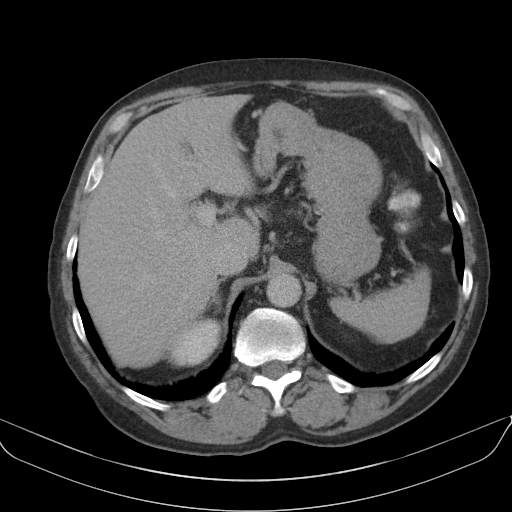
[im 27/40  lung]
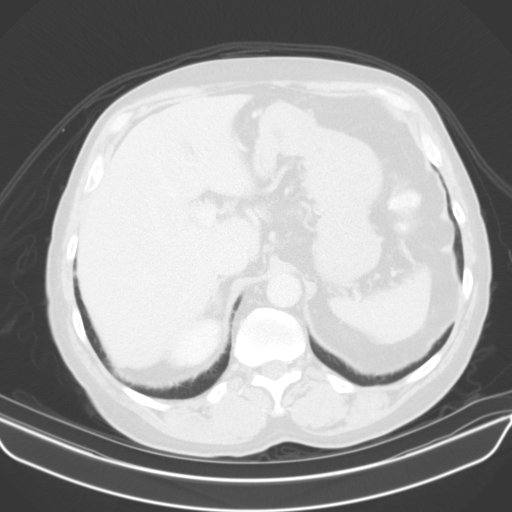
[im 33/40  soft-tissue]
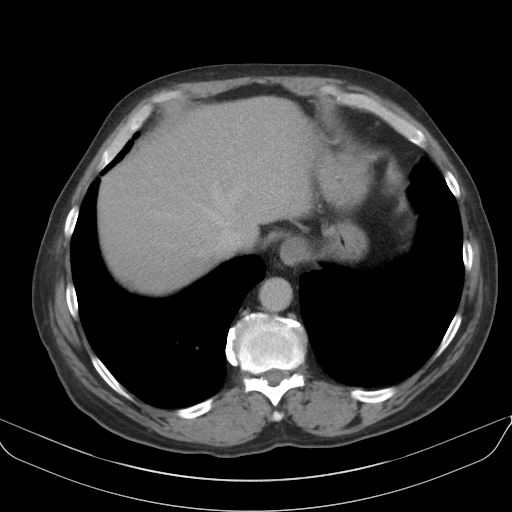
[im 33/40  lung]
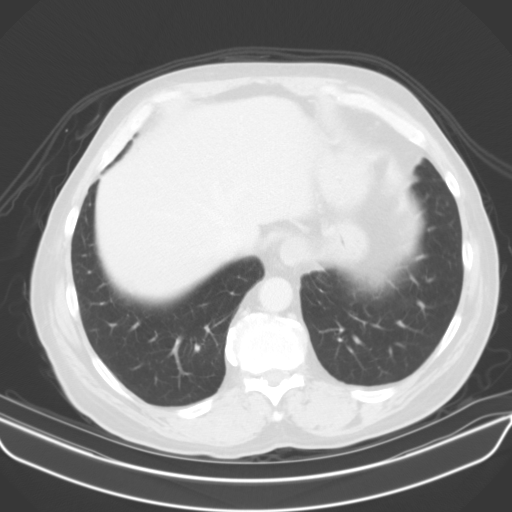

[9 of 46 positions shown; findings below may reference images not displayed]

FINDINGS: CT CHEST FINDINGS

Mediastinum/Nodes: No supraclavicular adenopathy. 8 mm right thyroid
nodule is nonspecific. Tortuous thoracic aorta. Mild cardiomegaly,
without pericardial effusion. Multivessel coronary artery
atherosclerosis. No central pulmonary embolism, on this
non-dedicated study. No mediastinal or hilar adenopathy. Multiple
small prevascular nodes are not pathologic by size criteria and
likely reactive.

Lungs/Pleura: No pleural fluid. Minimal motion degradation. Mild
centrilobular emphysema.

No nodules or airspace opacities.

Musculoskeletal: No acute osseous abnormality.

CT ABDOMEN AND PELVIS FINDINGS

Hepatobiliary: Normal liver. Normal gallbladder, without biliary
ductal dilatation.

Pancreas: Findings of chronic calcific pancreatitis. Atrophy and
parenchymal calcification with main and side branch duct prominence.
No evidence of acute inflammation or dominant pancreatic mass. This
is unchanged.

Spleen: Subcapsular posterior splenic lesion measures along 11 mm
and is nonspecific but not significantly changed.

Adrenals/Urinary Tract: Normal adrenal glands. Too small to
characterize interpolar left renal lesion is likely a cyst. Normal
right kidney, without hydronephrosis or hydroureter. Normal urinary
bladder.

Stomach/Bowel: Underdistended proximal stomach. Apparent greater
curvature wall thickening at 2.4 cm on image 55 is likely at least
partially secondary.

No primary dominant mass identified within the sigmoid. Metallic
object at the proximal sigmoid on image 97 is likely the site of
polyp removal. No pericolonic soft tissue infiltration or
adenopathy. No obstruction. Normal terminal ileum and appendix.
Normal small bowel.

Vascular/Lymphatic: Normal caliber of the aorta and branch vessels.
No abdominopelvic adenopathy.

Reproductive: Normal prostate.

Other: No significant free fluid. No evidence of omental or
peritoneal disease. Fluid density "Lesion" in the right groin
measures 3.0 x 1.9 cm on image 127. No abnormal enhancement. This is
positioned just medial to the right common femoral vasculature.
Enlarged from 1.3 cm in [QT].

Musculoskeletal: Degenerative partial fusion of the left sacroiliac
joint. Degenerative disc disease is advanced at L3-4. S1
transitional anatomy.
IMPRESSION: CT CHEST IMPRESSION

1.  No acute process or evidence of metastatic disease in the chest.
2. Mild cardiomegaly with coronary artery atherosclerosis.

CT ABDOMEN AND PELVIS IMPRESSION

1. The site of polyp removal is likely identified at the proximal
sigmoid. There is no evidence residual primary, pericolonic or
distant metastasis.
2. Chronic calcific pancreatitis, as before.
3. Probable lymphangioma or seroma in the right groin, enlarged
since [DATE]. Underdistended proximal stomach. Apparent gastric wall thickening
could be partially secondary. Cannot exclude gastritis.

## 2015-03-14 MED ORDER — IOPAMIDOL (ISOVUE-300) INJECTION 61%
125.0000 mL | Freq: Once | INTRAVENOUS | Status: AC | PRN
  Administered 2015-03-14: 125 mL via INTRAVENOUS

## 2015-03-15 DIAGNOSIS — C187 Malignant neoplasm of sigmoid colon: Secondary | ICD-10-CM | POA: Diagnosis not present

## 2015-03-15 DIAGNOSIS — R740 Nonspecific elevation of levels of transaminase and lactic acid dehydrogenase [LDH]: Secondary | ICD-10-CM | POA: Diagnosis not present

## 2015-03-19 DIAGNOSIS — H40033 Anatomical narrow angle, bilateral: Secondary | ICD-10-CM | POA: Diagnosis not present

## 2015-03-21 NOTE — Patient Instructions (Addendum)
YOUR PROCEDURE IS SCHEDULED ON :  03/29/15  REPORT TO [facility] MAIN ENTRANCE FOLLOW SIGNS TO EAST ELEVATOR - GO TO 3rd FLOOR CHECK IN AT 3 EAST NURSES STATION (SHORT STAY) AT: 6:30 AM  CALL THIS NUMBER IF YOU HAVE PROBLEMS THE MORNING OF SURGERY [phone]  REMEMBER:ONLY 1 PER PERSON MAY GO TO SHORT STAY WITH YOU TO GET READY THE MORNING OF YOUR SURGERY  DO NOT EAT FOOD OR DRINK LIQUIDS AFTER MIDNIGHT  TAKE THESE MEDICINES THE MORNING OF SURGERY: AMLODIPINE / PANTOPRAZOLE  YOU MAY NOT HAVE ANY METAL ON YOUR BODY INCLUDING HAIR PINS AND PIERCING'S. DO NOT WEAR JEWELRY, MAKEUP, LOTIONS, POWDERS OR PERFUMES. DO NOT WEAR NAIL POLISH. DO NOT SHAVE 48 HRS PRIOR TO SURGERY. MEN MAY SHAVE FACE AND NECK.  DO NOT [place]. [facility] IS NOT RESPONSIBLE FOR VALUABLES.  CONTACTS, DENTURES OR PARTIALS MAY NOT BE WORN TO SURGERY. LEAVE SUITCASE IN CAR. CAN BE BROUGHT TO ROOM AFTER SURGERY.  PATIENTS DISCHARGED THE DAY OF SURGERY WILL NOT BE ALLOWED TO DRIVE HOME.  PLEASE READ OVER THE FOLLOWING INSTRUCTION SHEETS _________________________________________________________________________________                                          [facility] [facility] - PREPARING FOR SURGERY  Before surgery, you can play an important role.  Because skin is not sterile, your skin needs to be as free of germs as possible.  You can reduce the number of germs on your skin by washing with CHG (chlorahexidine gluconate) soap before surgery.  CHG is an antiseptic cleaner which kills germs and bonds with the skin to continue killing germs even after washing. Please DO NOT use if you have an allergy to CHG or antibacterial soaps.  If your skin becomes reddened/irritated stop using the CHG and inform your nurse when you arrive at Short Stay. Do not shave (including legs and underarms) for at least 48 hours prior to the first CHG shower.  You may shave your face. Please follow these  instructions carefully:   1.  Shower with CHG Soap the night before surgery and the  morning of Surgery.   2.  If you choose to wash your hair, wash your hair first as usual with your  normal  Shampoo.   3.  After you shampoo, rinse your hair and body thoroughly to remove the  shampoo.                                         4.  Use CHG as you would any other liquid soap.  You can apply chg directly  to the skin and wash . Gently wash with scrungie or clean wascloth    5.  Apply the CHG Soap to your body ONLY FROM THE NECK DOWN.   Do not use on open                           Wound or open sores. Avoid contact with eyes, ears mouth and genitals (private parts).                        Genitals (private parts) with your normal soap.  6.  Wash thoroughly, paying special attention to the area where your surgery  will be performed.   7.  Thoroughly rinse your body with warm water from the neck down.   8.  DO NOT shower/wash with your normal soap after using and rinsing off  the CHG Soap .                9.  Pat yourself dry with a clean towel.             10.  Wear clean night clothes to bed after shower             11.  Place clean sheets on your bed the night of your first shower and do not  sleep with pets.  Day of Surgery : Do not apply any lotions/deodorants the morning of surgery.  Please wear clean clothes to the hospital/surgery center.  FAILURE TO FOLLOW THESE INSTRUCTIONS MAY RESULT IN THE CANCELLATION OF YOUR SURGERY    PATIENT SIGNATURE_________________________________  ______________________________________________________________________

## 2015-03-22 ENCOUNTER — Ambulatory Visit (HOSPITAL_COMMUNITY)
Admission: RE | Admit: 2015-03-22 | Discharge: 2015-03-22 | Disposition: A | Discharge status: Home / Self Care | Payer: Medicare Other | Source: Ambulatory Visit | Attending: Anesthesiology | Admitting: Anesthesiology

## 2015-03-22 ENCOUNTER — Encounter (HOSPITAL_COMMUNITY)
Admission: RE | Admit: 2015-03-22 | Discharge: 2015-03-22 | Disposition: A | Discharge status: Home / Self Care | Payer: Medicare Other | Source: Ambulatory Visit | Attending: General Surgery | Admitting: General Surgery

## 2015-03-22 ENCOUNTER — Encounter (HOSPITAL_COMMUNITY): Payer: Self-pay

## 2015-03-22 DIAGNOSIS — C189 Malignant neoplasm of colon, unspecified: Secondary | ICD-10-CM | POA: Insufficient documentation

## 2015-03-22 DIAGNOSIS — Z01812 Encounter for preprocedural laboratory examination: Secondary | ICD-10-CM | POA: Insufficient documentation

## 2015-03-22 DIAGNOSIS — R05 Cough: Secondary | ICD-10-CM | POA: Diagnosis not present

## 2015-03-22 DIAGNOSIS — I10 Essential (primary) hypertension: Secondary | ICD-10-CM | POA: Diagnosis not present

## 2015-03-22 DIAGNOSIS — Z0183 Encounter for blood typing: Secondary | ICD-10-CM | POA: Diagnosis not present

## 2015-03-22 DIAGNOSIS — R058 Other specified cough: Secondary | ICD-10-CM

## 2015-03-22 DIAGNOSIS — Z01818 Encounter for other preprocedural examination: Secondary | ICD-10-CM | POA: Insufficient documentation

## 2015-03-22 HISTORY — DX: Malignant neoplasm of colon, unspecified: C18.9

## 2015-03-22 HISTORY — DX: Major depressive disorder, single episode, unspecified: F32.9

## 2015-03-22 HISTORY — DX: Depression, unspecified: F32.A

## 2015-03-22 LAB — BASIC METABOLIC PANEL
Anion gap: 9 (ref 5–15)
BUN: 7 mg/dL (ref 6–20)
CO2: 25 mmol/L (ref 22–32)
Calcium: 9.5 mg/dL (ref 8.9–10.3)
Chloride: 106 mmol/L (ref 101–111)
Creatinine, Ser: 1.08 mg/dL (ref 0.61–1.24)
GFR calc Af Amer: >60 mL/min (ref >60)
GFR calc non Af Amer: >60 mL/min (ref >60)
Glucose, Bld: 145 mg/dL — ABNORMAL HIGH (ref 65–99)
Potassium: 4.2 mmol/L (ref 3.5–5.1)
Sodium: 140 mmol/L (ref 135–145)

## 2015-03-22 LAB — CBC
HCT: 41.2 % (ref 39.0–52.0)
Hemoglobin: 13.6 g/dL (ref 13.0–17.0)
MCH: 30.6 pg (ref 26.0–34.0)
MCHC: 33 g/dL (ref 30.0–36.0)
MCV: 92.6 fL (ref 78.0–100.0)
Platelets: 302 10*3/uL (ref 150–400)
RBC: 4.45 MIL/uL (ref 4.22–5.81)
RDW: 13.3 % (ref 11.5–15.5)
WBC: 9.2 10*3/uL (ref 4.0–10.5)

## 2015-03-22 LAB — ABO/RH: ABO/RH(D): O NEG

## 2015-03-22 IMAGING — CR DG CHEST 2V
2 series · 2 of 2 positions shown · non-contrast
Comparison: CT [DATE] .

CLINICAL DATA: Productive cough.

EXAM:
CHEST  2 VIEW

[w chest pa *]
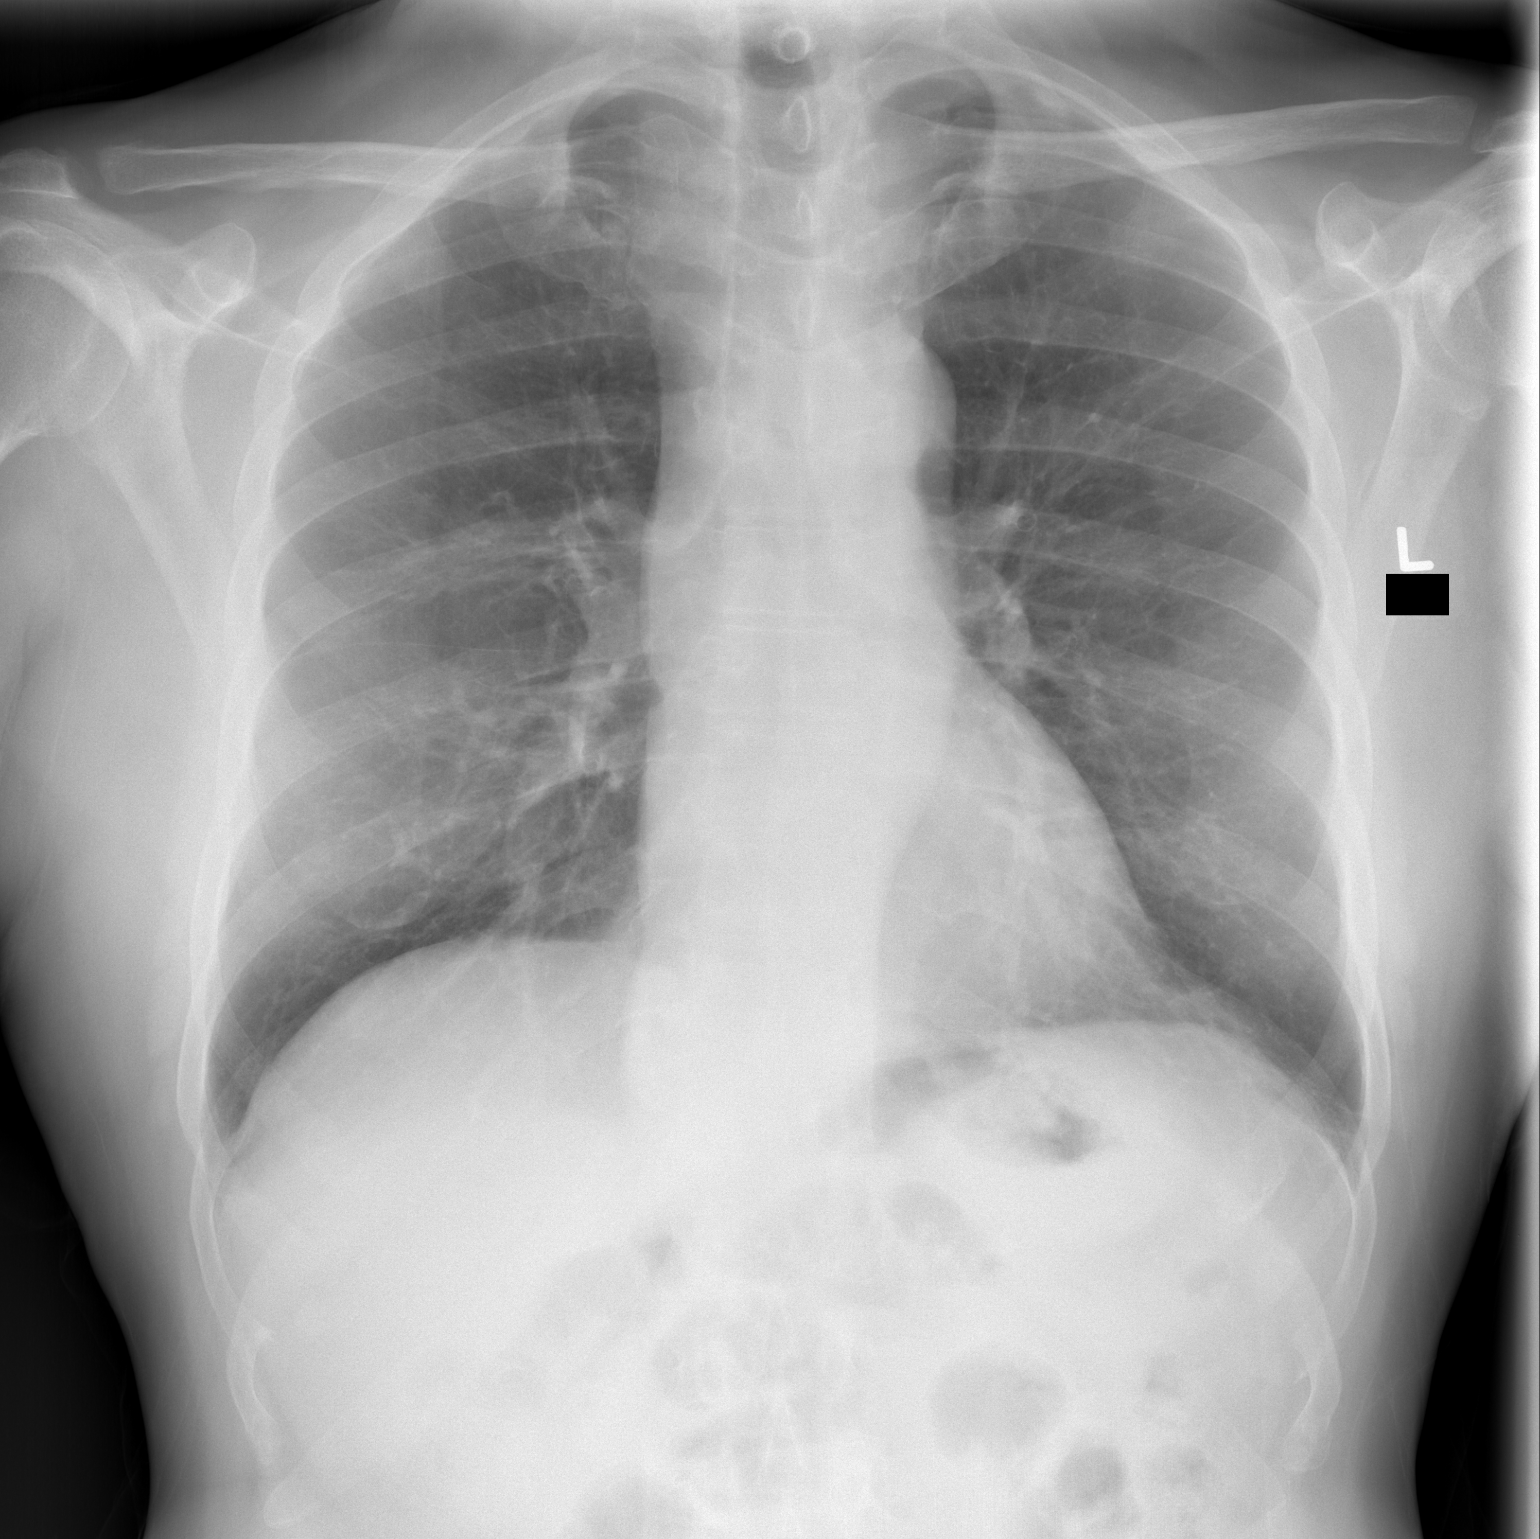

[w chest lat]
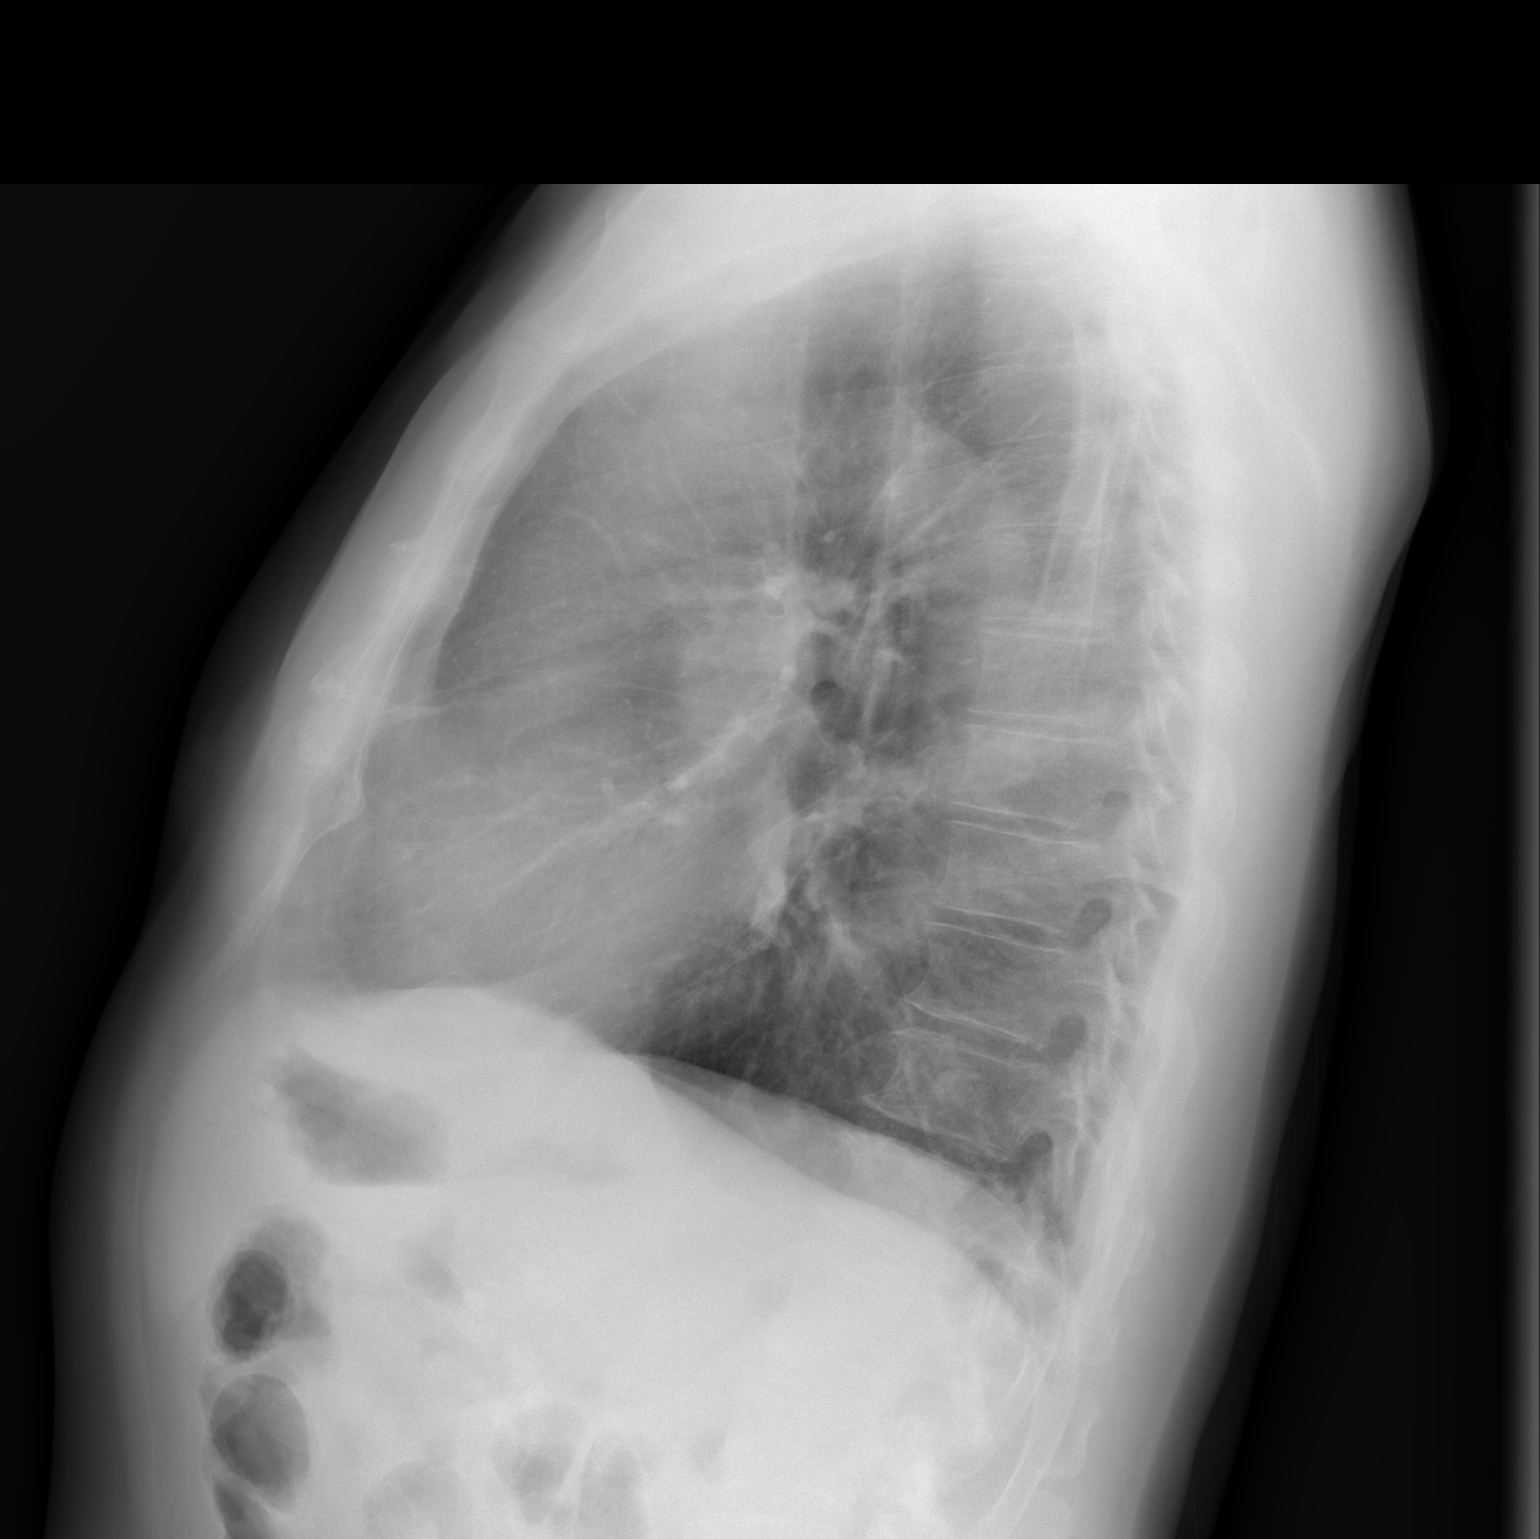

[2 of 2 positions shown; findings below may reference images not displayed]

FINDINGS: Mediastinum hilar structures normal. Lungs are clear. Heart size
normal. No pleural effusion or pneumothorax. No acute bony
abnormality.
IMPRESSION: No acute cardiopulmonary disease.

## 2015-03-22 NOTE — Progress Notes (Signed)
   03/22/15 0852  OBSTRUCTIVE SLEEP APNEA  Have you ever been diagnosed with sleep apnea through a sleep study? No  Do you snore loudly (loud enough to be heard through closed doors)?  1  Do you often feel tired, fatigued, or sleepy during the daytime? 0  Has anyone observed you stop breathing during your sleep? 0  Do you have, or are you being treated for high blood pressure? 1  BMI more than 35 kg/m2? 0  Age over 66 years old? 1  Neck circumference greater than 40 cm/16 inches? 1  Gender: 1

## 2015-03-23 LAB — HEMOGLOBIN A1C
Hgb A1c MFr Bld: 7.6 % — ABNORMAL HIGH (ref 4.8–5.6)
Mean Plasma Glucose: 171 mg/dL

## 2015-03-23 LAB — CEA: CEA: 6.3 ng/mL — ABNORMAL HIGH (ref 0.0–4.7)

## 2015-03-27 NOTE — Progress Notes (Signed)
Anthony [name] - diabetic coordinator notified of pt surgery and that pt has insulin pump - requested pt to wear pump to hospital then it can be disconnected prior to surgery - also requested pt to sign pump contract and bring supplies to hospital. Pt was called and informed. I spoke with Dr. Almetta Lovely nurse Caren Griffins about a note from [facility] concerning pump. Received note from [place] stating pump should be discontinued for surgery and not restarted until pt is back home. To use insulin protocol while in hospital. Caren Griffins said she would call pt to inform him.

## 2015-03-29 ENCOUNTER — Inpatient Hospital Stay (HOSPITAL_COMMUNITY): Payer: Medicare Other | Admitting: Anesthesiology

## 2015-03-29 ENCOUNTER — Inpatient Hospital Stay (HOSPITAL_COMMUNITY)
Admission: RE | Admit: 2015-03-29 | Discharge: 2015-04-02 | DRG: 331 | Disposition: A | Discharge status: Home / Self Care | Payer: Medicare Other | Source: Ambulatory Visit | Attending: General Surgery | Admitting: General Surgery

## 2015-03-29 ENCOUNTER — Encounter (HOSPITAL_COMMUNITY)
Admission: RE | Disposition: A | Discharge status: Home / Self Care | Payer: Self-pay | Source: Ambulatory Visit | Attending: General Surgery

## 2015-03-29 ENCOUNTER — Encounter (HOSPITAL_COMMUNITY): Payer: Self-pay | Admitting: *Deleted

## 2015-03-29 DIAGNOSIS — Z8249 Family history of ischemic heart disease and other diseases of the circulatory system: Secondary | ICD-10-CM

## 2015-03-29 DIAGNOSIS — R748 Abnormal levels of other serum enzymes: Secondary | ICD-10-CM | POA: Diagnosis present

## 2015-03-29 DIAGNOSIS — Z794 Long term (current) use of insulin: Secondary | ICD-10-CM | POA: Diagnosis not present

## 2015-03-29 DIAGNOSIS — C189 Malignant neoplasm of colon, unspecified: Secondary | ICD-10-CM | POA: Diagnosis present

## 2015-03-29 DIAGNOSIS — I10 Essential (primary) hypertension: Secondary | ICD-10-CM | POA: Diagnosis present

## 2015-03-29 DIAGNOSIS — E109 Type 1 diabetes mellitus without complications: Secondary | ICD-10-CM | POA: Diagnosis present

## 2015-03-29 DIAGNOSIS — E780 Pure hypercholesterolemia: Secondary | ICD-10-CM | POA: Diagnosis present

## 2015-03-29 DIAGNOSIS — Z9641 Presence of insulin pump (external) (internal): Secondary | ICD-10-CM | POA: Diagnosis present

## 2015-03-29 DIAGNOSIS — Z8042 Family history of malignant neoplasm of prostate: Secondary | ICD-10-CM

## 2015-03-29 DIAGNOSIS — K219 Gastro-esophageal reflux disease without esophagitis: Secondary | ICD-10-CM | POA: Diagnosis present

## 2015-03-29 DIAGNOSIS — D125 Benign neoplasm of sigmoid colon: Secondary | ICD-10-CM | POA: Diagnosis not present

## 2015-03-29 DIAGNOSIS — Z79899 Other long term (current) drug therapy: Secondary | ICD-10-CM

## 2015-03-29 DIAGNOSIS — C19 Malignant neoplasm of rectosigmoid junction: Secondary | ICD-10-CM | POA: Diagnosis not present

## 2015-03-29 DIAGNOSIS — Z87891 Personal history of nicotine dependence: Secondary | ICD-10-CM

## 2015-03-29 DIAGNOSIS — C187 Malignant neoplasm of sigmoid colon: Secondary | ICD-10-CM | POA: Diagnosis not present

## 2015-03-29 HISTORY — PX: LAPAROSCOPIC PARTIAL COLECTOMY: SHX5907

## 2015-03-29 LAB — GLUCOSE, CAPILLARY
Glucose-Capillary: 144 mg/dL — ABNORMAL HIGH (ref 65–99)
Glucose-Capillary: 121 mg/dL — ABNORMAL HIGH (ref 65–99)
Glucose-Capillary: 134 mg/dL — ABNORMAL HIGH (ref 65–99)
Glucose-Capillary: 143 mg/dL — ABNORMAL HIGH (ref 65–99)
Glucose-Capillary: 166 mg/dL — ABNORMAL HIGH (ref 65–99)
Glucose-Capillary: 193 mg/dL — ABNORMAL HIGH (ref 65–99)
Glucose-Capillary: 176 mg/dL — ABNORMAL HIGH (ref 65–99)
Glucose-Capillary: 220 mg/dL — ABNORMAL HIGH (ref 65–99)
Glucose-Capillary: 273 mg/dL — ABNORMAL HIGH (ref 65–99)
Glucose-Capillary: 359 mg/dL — ABNORMAL HIGH (ref 65–99)

## 2015-03-29 LAB — TYPE AND SCREEN
ABO/RH(D): O NEG
Antibody Screen: NEGATIVE

## 2015-03-29 SURGERY — LAPAROSCOPIC PARTIAL COLECTOMY
Anesthesia: General | Site: Abdomen

## 2015-03-29 MED ORDER — LACTATED RINGERS IR SOLN
Status: DC | PRN
Start: 2015-03-29 — End: 2015-03-29
  Administered 2015-03-29: 1

## 2015-03-29 MED ORDER — DEXAMETHASONE SODIUM PHOSPHATE 10 MG/ML IJ SOLN
INTRAMUSCULAR | Status: AC
  Filled 2015-03-29: qty 1

## 2015-03-29 MED ORDER — ONDANSETRON HCL 4 MG/2ML IJ SOLN
INTRAMUSCULAR | Status: AC
  Filled 2015-03-29: qty 2

## 2015-03-29 MED ORDER — BUPIVACAINE-EPINEPHRINE 0.25% -1:200000 IJ SOLN
INTRAMUSCULAR | Status: DC | PRN
Start: 2015-03-29 — End: 2015-03-29
  Administered 2015-03-29: 30 mL

## 2015-03-29 MED ORDER — AMLODIPINE BESYLATE 10 MG PO TABS
10.0000 mg | ORAL_TABLET | Freq: Every morning | ORAL | Status: DC
  Administered 2015-03-30 – 2015-04-02 (×4): 10 mg via ORAL
  Filled 2015-03-29 (×4): qty 1

## 2015-03-29 MED ORDER — DEXTROSE 5 % IV SOLN
2.0000 g | INTRAVENOUS | Status: AC
  Administered 2015-03-29: 2 g via INTRAVENOUS
  Filled 2015-03-29: qty 2

## 2015-03-29 MED ORDER — MIDAZOLAM HCL 2 MG/2ML IJ SOLN
INTRAMUSCULAR | Status: AC
  Filled 2015-03-29: qty 4

## 2015-03-29 MED ORDER — FOSINOPRIL SODIUM 20 MG PO TABS
40.0000 mg | ORAL_TABLET | Freq: Every morning | ORAL | Status: DC
  Filled 2015-03-29: qty 2

## 2015-03-29 MED ORDER — ENOXAPARIN SODIUM 40 MG/0.4ML ~~LOC~~ SOLN
40.0000 mg | SUBCUTANEOUS | Status: DC
  Administered 2015-03-30 – 2015-04-02 (×4): 40 mg via SUBCUTANEOUS
  Filled 2015-03-29 (×5): qty 0.4

## 2015-03-29 MED ORDER — INSULIN ASPART 100 UNIT/ML ~~LOC~~ SOLN: 0.0000 [IU] | Freq: Three times a day (TID) | SUBCUTANEOUS | Status: DC

## 2015-03-29 MED ORDER — LIDOCAINE HCL (CARDIAC) 20 MG/ML IV SOLN
INTRAVENOUS | Status: DC | PRN
  Administered 2015-03-29: 100 mg via INTRAVENOUS

## 2015-03-29 MED ORDER — ALVIMOPAN 12 MG PO CAPS
12.0000 mg | ORAL_CAPSULE | Freq: Once | ORAL | Status: AC
  Administered 2015-03-29: 12 mg via ORAL
  Filled 2015-03-29: qty 1

## 2015-03-29 MED ORDER — HYDROMORPHONE HCL 1 MG/ML IJ SOLN
0.2500 mg | INTRAMUSCULAR | Status: DC | PRN
  Administered 2015-03-29 (×3): 0.5 mg via INTRAVENOUS

## 2015-03-29 MED ORDER — BUPIVACAINE-EPINEPHRINE (PF) 0.25% -1:200000 IJ SOLN
INTRAMUSCULAR | Status: AC
  Filled 2015-03-29: qty 30

## 2015-03-29 MED ORDER — MORPHINE SULFATE (PF) 2 MG/ML IV SOLN
2.0000 mg | INTRAVENOUS | Status: DC | PRN
  Administered 2015-03-29 – 2015-03-30 (×4): 2 mg via INTRAVENOUS
  Administered 2015-03-30: 4 mg via INTRAVENOUS
  Filled 2015-03-29 (×4): qty 1
  Filled 2015-03-29 (×2): qty 2

## 2015-03-29 MED ORDER — HYDROMORPHONE HCL 1 MG/ML IJ SOLN
INTRAMUSCULAR | Status: AC
  Administered 2015-03-29: 0.5 mg via INTRAVENOUS
  Filled 2015-03-29: qty 1

## 2015-03-29 MED ORDER — PHENYLEPHRINE HCL 10 MG/ML IJ SOLN
INTRAMUSCULAR | Status: DC | PRN
  Administered 2015-03-29 (×5): 80 ug via INTRAVENOUS

## 2015-03-29 MED ORDER — PANTOPRAZOLE SODIUM 40 MG PO TBEC
40.0000 mg | DELAYED_RELEASE_TABLET | Freq: Every day | ORAL | Status: DC
  Administered 2015-03-30 – 2015-04-02 (×4): 40 mg via ORAL
  Filled 2015-03-29 (×4): qty 1

## 2015-03-29 MED ORDER — HYDROCODONE-ACETAMINOPHEN 5-325 MG PO TABS
1.0000 | ORAL_TABLET | ORAL | Status: DC | PRN
  Administered 2015-03-29: 1 via ORAL
  Administered 2015-03-30 – 2015-04-02 (×13): 2 via ORAL
  Filled 2015-03-29: qty 2
  Filled 2015-03-29: qty 1
  Filled 2015-03-29 (×13): qty 2

## 2015-03-29 MED ORDER — INSULIN PUMP
Freq: Three times a day (TID) | SUBCUTANEOUS | Status: DC
  Administered 2015-03-29: 22:00:00 via SUBCUTANEOUS
  Administered 2015-03-29: 2.5 via SUBCUTANEOUS
  Administered 2015-03-30: 3 via SUBCUTANEOUS
  Administered 2015-03-30 – 2015-03-31 (×8): via SUBCUTANEOUS
  Administered 2015-03-31: 3 via SUBCUTANEOUS
  Administered 2015-04-01: 2 via SUBCUTANEOUS
  Administered 2015-04-01: 3.5 via SUBCUTANEOUS
  Administered 2015-04-01: 0.25 via SUBCUTANEOUS
  Administered 2015-04-01: 22:00:00 via SUBCUTANEOUS
  Administered 2015-04-02: 1 via SUBCUTANEOUS
  Administered 2015-04-02: 02:00:00 via SUBCUTANEOUS
  Filled 2015-03-29: qty 1

## 2015-03-29 MED ORDER — 0.9 % SODIUM CHLORIDE (POUR BTL) OPTIME
TOPICAL | Status: DC | PRN
  Administered 2015-03-29: 1000 mL

## 2015-03-29 MED ORDER — KCL IN DEXTROSE-NACL 30-5-0.45 MEQ/L-%-% IV SOLN
INTRAVENOUS | Status: DC
  Administered 2015-03-29 – 2015-03-31 (×4): via INTRAVENOUS
  Filled 2015-03-29 (×8): qty 1000

## 2015-03-29 MED ORDER — CEFOTETAN DISODIUM-DEXTROSE 2-2.08 GM-% IV SOLR
INTRAVENOUS | Status: AC
  Filled 2015-03-29: qty 50

## 2015-03-29 MED ORDER — HYDROCODONE-ACETAMINOPHEN 5-325 MG PO TABS: 1.0000 | ORAL_TABLET | ORAL | Status: DC | PRN

## 2015-03-29 MED ORDER — HEPARIN SODIUM (PORCINE) 5000 UNIT/ML IJ SOLN
5000.0000 [IU] | Freq: Once | INTRAMUSCULAR | Status: AC
  Administered 2015-03-29: 5000 [IU] via SUBCUTANEOUS
  Filled 2015-03-29: qty 1

## 2015-03-29 MED ORDER — HYDROMORPHONE HCL 1 MG/ML IJ SOLN
INTRAMUSCULAR | Status: AC
  Filled 2015-03-29: qty 1

## 2015-03-29 MED ORDER — PHENYLEPHRINE 40 MCG/ML (10ML) SYRINGE FOR IV PUSH (FOR BLOOD PRESSURE SUPPORT)
PREFILLED_SYRINGE | INTRAVENOUS | Status: AC
  Filled 2015-03-29: qty 10

## 2015-03-29 MED ORDER — ONDANSETRON HCL 4 MG/2ML IJ SOLN: 4.0000 mg | Freq: Four times a day (QID) | INTRAMUSCULAR | Status: DC | PRN

## 2015-03-29 MED ORDER — MIDAZOLAM HCL 5 MG/5ML IJ SOLN
INTRAMUSCULAR | Status: DC | PRN
  Administered 2015-03-29: 2 mg via INTRAVENOUS

## 2015-03-29 MED ORDER — PROMETHAZINE HCL 25 MG/ML IJ SOLN: 6.2500 mg | INTRAMUSCULAR | Status: DC | PRN

## 2015-03-29 MED ORDER — NEOSTIGMINE METHYLSULFATE 10 MG/10ML IV SOLN
INTRAVENOUS | Status: AC
  Filled 2015-03-29: qty 1

## 2015-03-29 MED ORDER — DIPHENHYDRAMINE HCL 12.5 MG/5ML PO ELIX: 12.5000 mg | ORAL_SOLUTION | Freq: Four times a day (QID) | ORAL | Status: DC | PRN

## 2015-03-29 MED ORDER — NEOSTIGMINE METHYLSULFATE 10 MG/10ML IV SOLN
INTRAVENOUS | Status: DC | PRN
  Administered 2015-03-29: 5 mg via INTRAVENOUS

## 2015-03-29 MED ORDER — LACTATED RINGERS IV SOLN
INTRAVENOUS | Status: DC
  Administered 2015-03-29: 10:00:00 via INTRAVENOUS
  Administered 2015-03-29: 1000 mL via INTRAVENOUS

## 2015-03-29 MED ORDER — FENTANYL CITRATE (PF) 100 MCG/2ML IJ SOLN
INTRAMUSCULAR | Status: DC | PRN
  Administered 2015-03-29 (×2): 50 ug via INTRAVENOUS

## 2015-03-29 MED ORDER — SUCCINYLCHOLINE CHLORIDE 20 MG/ML IJ SOLN
INTRAMUSCULAR | Status: DC | PRN
  Administered 2015-03-29: 100 mg via INTRAVENOUS

## 2015-03-29 MED ORDER — ROCURONIUM BROMIDE 100 MG/10ML IV SOLN
INTRAVENOUS | Status: DC | PRN
  Administered 2015-03-29: 40 mg via INTRAVENOUS
  Administered 2015-03-29 (×2): 5 mg via INTRAVENOUS

## 2015-03-29 MED ORDER — DIPHENHYDRAMINE HCL 50 MG/ML IJ SOLN: 12.5000 mg | Freq: Four times a day (QID) | INTRAMUSCULAR | Status: DC | PRN

## 2015-03-29 MED ORDER — LIDOCAINE HCL (CARDIAC) 20 MG/ML IV SOLN
INTRAVENOUS | Status: AC
  Filled 2015-03-29: qty 5

## 2015-03-29 MED ORDER — GLYCOPYRROLATE 0.2 MG/ML IJ SOLN
INTRAMUSCULAR | Status: DC | PRN
  Administered 2015-03-29: 0.6 mg via INTRAVENOUS

## 2015-03-29 MED ORDER — INSULIN ASPART 100 UNIT/ML ~~LOC~~ SOLN: 0.0000 [IU] | Freq: Every day | SUBCUTANEOUS | Status: DC

## 2015-03-29 MED ORDER — PROPOFOL 10 MG/ML IV BOLUS
INTRAVENOUS | Status: DC | PRN
  Administered 2015-03-29: 170 mg via INTRAVENOUS

## 2015-03-29 MED ORDER — DEXAMETHASONE SODIUM PHOSPHATE 10 MG/ML IJ SOLN
INTRAMUSCULAR | Status: DC | PRN
  Administered 2015-03-29: 10 mg via INTRAVENOUS

## 2015-03-29 MED ORDER — LACTATED RINGERS IV SOLN
INTRAVENOUS | Status: DC
  Administered 2015-03-29: 1000 mL via INTRAVENOUS

## 2015-03-29 MED ORDER — ALVIMOPAN 12 MG PO CAPS
12.0000 mg | ORAL_CAPSULE | Freq: Two times a day (BID) | ORAL | Status: DC
  Administered 2015-03-30 – 2015-04-01 (×6): 12 mg via ORAL
  Filled 2015-03-29 (×8): qty 1

## 2015-03-29 MED ORDER — ONDANSETRON HCL 4 MG/2ML IJ SOLN
INTRAMUSCULAR | Status: DC | PRN
  Administered 2015-03-29: 4 mg via INTRAVENOUS

## 2015-03-29 MED ORDER — DEXTROSE 5 % IV SOLN
2.0000 g | Freq: Two times a day (BID) | INTRAVENOUS | Status: AC
  Administered 2015-03-29: 2 g via INTRAVENOUS
  Filled 2015-03-29: qty 2

## 2015-03-29 MED ORDER — MEPERIDINE HCL 50 MG/ML IJ SOLN: 6.2500 mg | INTRAMUSCULAR | Status: DC | PRN

## 2015-03-29 MED ORDER — ACETAMINOPHEN 500 MG PO TABS
1000.0000 mg | ORAL_TABLET | Freq: Four times a day (QID) | ORAL | Status: AC
  Administered 2015-03-29 – 2015-03-30 (×3): 1000 mg via ORAL
  Filled 2015-03-29 (×4): qty 2

## 2015-03-29 MED ORDER — GLYCOPYRROLATE 0.2 MG/ML IJ SOLN
INTRAMUSCULAR | Status: AC
  Filled 2015-03-29: qty 3

## 2015-03-29 MED ORDER — PROPOFOL 10 MG/ML IV BOLUS
INTRAVENOUS | Status: AC
  Filled 2015-03-29: qty 20

## 2015-03-29 MED ORDER — ROCURONIUM BROMIDE 100 MG/10ML IV SOLN
INTRAVENOUS | Status: AC
  Filled 2015-03-29: qty 1

## 2015-03-29 MED ORDER — FENTANYL CITRATE (PF) 250 MCG/5ML IJ SOLN
INTRAMUSCULAR | Status: AC
  Filled 2015-03-29: qty 25

## 2015-03-29 MED ORDER — ONDANSETRON HCL 4 MG PO TABS: 4.0000 mg | ORAL_TABLET | Freq: Four times a day (QID) | ORAL | Status: DC | PRN

## 2015-03-29 SURGICAL SUPPLY — 75 items
APPLIER CLIP 5 13 M/L LIGAMAX5 (MISCELLANEOUS)
BLADE EXTENDED COATED 6.5IN (ELECTRODE) ×2 IMPLANT
CABLE HIGH FREQUENCY MONO STRZ (ELECTRODE) IMPLANT
CELLS DAT CNTRL 66122 CELL SVR (MISCELLANEOUS) IMPLANT
CHLORAPREP W/TINT 26ML (MISCELLANEOUS) ×2 IMPLANT
CLIP APPLIE 5 13 M/L LIGAMAX5 (MISCELLANEOUS) IMPLANT
COVER MAYO STAND STRL (DRAPES) ×6 IMPLANT
COVER SURGICAL LIGHT HANDLE (MISCELLANEOUS) ×2 IMPLANT
CUTTER ECHEON FLEX ENDO 45 340 (ENDOMECHANICALS) ×4 IMPLANT
CUTTER FLEX LINEAR 45M (STAPLE) ×4 IMPLANT
DECANTER SPIKE VIAL GLASS SM (MISCELLANEOUS) ×2 IMPLANT
DEVICE TROCAR PUNCTURE CLOSURE (ENDOMECHANICALS) ×2 IMPLANT
DRAIN CHANNEL 19F RND (DRAIN) IMPLANT
DRAPE LAPAROSCOPIC ABDOMINAL (DRAPES) ×2 IMPLANT
DRAPE SURG IRRIG POUCH 19X23 (DRAPES) ×2 IMPLANT
DRSG OPSITE POSTOP 4X10 (GAUZE/BANDAGES/DRESSINGS) IMPLANT
DRSG OPSITE POSTOP 4X6 (GAUZE/BANDAGES/DRESSINGS) ×2 IMPLANT
DRSG OPSITE POSTOP 4X8 (GAUZE/BANDAGES/DRESSINGS) IMPLANT
ELECT PENCIL ROCKER SW 15FT (MISCELLANEOUS) ×4 IMPLANT
ELECT REM PT RETURN 9FT ADLT (ELECTROSURGICAL) ×2
ELECTRODE REM PT RTRN 9FT ADLT (ELECTROSURGICAL) ×1 IMPLANT
EVACUATOR SILICONE 100CC (DRAIN) IMPLANT
GAUZE SPONGE 4X4 12PLY STRL (GAUZE/BANDAGES/DRESSINGS) IMPLANT
GLOVE BIO SURGEON STRL SZ 6.5 (GLOVE) ×4 IMPLANT
GLOVE BIOGEL PI IND STRL 7.0 (GLOVE) ×2 IMPLANT
GLOVE BIOGEL PI INDICATOR 7.0 (GLOVE) ×2
GOWN STRL REUS W/TWL 2XL LVL3 (GOWN DISPOSABLE) ×4 IMPLANT
GOWN STRL REUS W/TWL XL LVL3 (GOWN DISPOSABLE) ×8 IMPLANT
LEGGING LITHOTOMY PAIR STRL (DRAPES) IMPLANT
LIGASURE IMPACT 36 18CM CVD LR (INSTRUMENTS) IMPLANT
LIQUID BAND (GAUZE/BANDAGES/DRESSINGS) ×2 IMPLANT
PACK COLON (CUSTOM PROCEDURE TRAY) ×2 IMPLANT
PAD POSITIONING PINK XL (MISCELLANEOUS) ×2 IMPLANT
PORT LAP GEL ALEXIS MED 5-9CM (MISCELLANEOUS) ×2 IMPLANT
RELOAD BLUE CHELON 45 (STAPLE) ×2 IMPLANT
RELOAD WH ECHELON 45 (STAPLE) ×4 IMPLANT
RTRCTR WOUND ALEXIS 18CM MED (MISCELLANEOUS)
SCISSORS LAP 5X35 DISP (ENDOMECHANICALS) ×2 IMPLANT
SEALER TISSUE G2 STRG ARTC 35C (ENDOMECHANICALS) IMPLANT
SET IRRIG TUBING LAPAROSCOPIC (IRRIGATION / IRRIGATOR) ×2 IMPLANT
SLEEVE XCEL OPT CAN 5 100 (ENDOMECHANICALS) ×2 IMPLANT
SPONGE LAP 18X18 X RAY DECT (DISPOSABLE) IMPLANT
STAPLER CIRC ILS CVD 33MM 37CM (STAPLE) ×2 IMPLANT
STAPLER VISISTAT 35W (STAPLE) ×2 IMPLANT
SUCTION POOLE TIP (SUCTIONS) IMPLANT
SUT ETHILON 2 0 PS N (SUTURE) IMPLANT
SUT NOVA 1 T20/GS 25DT (SUTURE) IMPLANT
SUT NOVA NAB DX-16 0-1 5-0 T12 (SUTURE) IMPLANT
SUT PDS AB 1 CTX 36 (SUTURE) IMPLANT
SUT PDS AB 1 TP1 96 (SUTURE) IMPLANT
SUT PROLENE 2 0 KS (SUTURE) ×2 IMPLANT
SUT SILK 2 0 (SUTURE) ×1
SUT SILK 2 0 SH CR/8 (SUTURE) ×2 IMPLANT
SUT SILK 2-0 18XBRD TIE 12 (SUTURE) ×1 IMPLANT
SUT SILK 3 0 (SUTURE) ×1
SUT SILK 3 0 SH CR/8 (SUTURE) ×2 IMPLANT
SUT SILK 3-0 18XBRD TIE 12 (SUTURE) ×1 IMPLANT
SUT VIC AB 2-0 SH 18 (SUTURE) IMPLANT
SUT VIC AB 4-0 PS2 18 (SUTURE) IMPLANT
SUT VIC AB 4-0 PS2 27 (SUTURE) IMPLANT
SUT VICRYL 0 UR6 27IN ABS (SUTURE) ×2 IMPLANT
SUT VICRYL 2 0 18  UND BR (SUTURE)
SUT VICRYL 2 0 18 UND BR (SUTURE) IMPLANT
SYS LAPSCP GELPORT 120MM (MISCELLANEOUS)
SYSTEM LAPSCP GELPORT 120MM (MISCELLANEOUS) IMPLANT
TOWEL OR 17X26 10 PK STRL BLUE (TOWEL DISPOSABLE) IMPLANT
TOWEL OR NON WOVEN STRL DISP B (DISPOSABLE) IMPLANT
TRAY FOLEY W/METER SILVER 14FR (SET/KITS/TRAYS/PACK) ×2 IMPLANT
TRAY FOLEY W/METER SILVER 16FR (SET/KITS/TRAYS/PACK) ×2 IMPLANT
TROCAR BLADELESS OPT 5 100 (ENDOMECHANICALS) ×2 IMPLANT
TROCAR XCEL 12X100 BLDLESS (ENDOMECHANICALS) IMPLANT
TROCAR XCEL BLUNT TIP 100MML (ENDOMECHANICALS) IMPLANT
TROCAR XCEL NON-BLD 11X100MML (ENDOMECHANICALS) IMPLANT
TUBING FILTER THERMOFLATOR (ELECTROSURGICAL) ×2 IMPLANT
TUBING INSUFFLATION 10FT LAP (TUBING) ×2 IMPLANT

## 2015-03-29 NOTE — Anesthesia Procedure Notes (Signed)
Procedure Name: Intubation Date/Time: 03/29/2015 9:06 AM Performed by: Lind Covert Pre-anesthesia Checklist: Patient identified, Emergency Drugs available, Suction available, Patient being monitored and Timeout performed Patient Re-evaluated:Patient Re-evaluated prior to inductionOxygen Delivery Method: Circle system utilized Preoxygenation: Pre-oxygenation with 100% oxygen Intubation Type: IV induction Laryngoscope Size: Mac and 4 Grade View: Grade I Tube type: Oral Tube size: 7.5 mm Number of attempts: 1 Airway Equipment and Method: Stylet Placement Confirmation: ETT inserted through vocal cords under direct vision,  positive ETCO2 and breath sounds checked- equal and bilateral Secured at: 23 cm Tube secured with: Tape Dental Injury: Teeth and Oropharynx as per pre-operative assessment

## 2015-03-29 NOTE — H&P (View-Only) (Signed)
Anthony [name] 03/07/2015 1:52 PM Location: [facility] Patient #: [id number] DOB: 05/09/1946 Married / Language: English / Race: Black or African American Male History of Present Illness (Anthony Rucinski MD; 03/07/2015 2:16 PM) Patient words: colon ca.  The patient is a 68 year old male who presents with colorectal cancer. 68-year-old male who presents to us as a referral from Dr. Hayes. He underwent a colonoscopy due to rectal bleeding. On exam a 30 mm polyp was found in the proximal sigmoid colon. This was semi-sessile and removed using piecemeal technique with snare cautery. The polyp resection was incomplete. The area was tattooed with [place] ink. Biopsies showed adenocarcinoma. The patient denies any weight loss. He is currently not having any rectal bleeding. He denies any abdominal pain. He has no family history of colon cancer. Patient is also being treated but Dr. Hayes for elevated liver enzymes. These are thought to be due to his history of alcohol use. The patient is also a type II diabetic and uses insulin to manage this at home. Other Problems (Kelly Dockery, LPN; 03/07/2015 1:52 PM) Diabetes Mellitus Gastroesophageal Reflux Disease High blood pressure Hypercholesterolemia Pancreatitis  Past Surgical History (Kelly Dockery, LPN; 03/07/2015 1:52 PM) Oral Surgery  Allergies (Kelly Dockery, LPN; 03/07/2015 1:54 PM) Toradol *ANALGESICS - ANTI-INFLAMMATORY*  Medication History (Kelly Dockery, LPN; 03/07/2015 1:55 PM) Neurontin (400MG Capsule, Oral) Active. Lotrel (10-20MG Capsule, Oral) Active. NovoLIN N (100UNIT/ML Suspension, Subcutaneous) Active. Protonix (40MG Packet, Oral) Active. Medications Reconciled  Social History (Kelly Dockery, LPN; 03/07/2015 1:52 PM) Alcohol use Moderate alcohol use. Caffeine use Carbonated beverages. No drug use Tobacco use Former smoker.  Family History (Kelly Dockery, LPN; 03/07/2015 1:52 PM) Arthritis  Mother. Hypertension Father. Prostate Cancer Father.     Review of Systems (Kelly Dockery LPN; 03/07/2015 1:52 PM) General Not Present- Appetite Loss, Chills, Fatigue, Fever, Night Sweats, Weight Gain and Weight Loss. Skin Not Present- Change in Wart/Mole, Dryness, Hives, Jaundice, New Lesions, Non-Healing Wounds, Rash and Ulcer. HEENT Present- Ringing in the Ears and Seasonal Allergies. Not Present- Earache, Hearing Loss, Hoarseness, Nose Bleed, Oral Ulcers, Sinus Pain, Sore Throat, Visual Disturbances, Wears glasses/contact lenses and Yellow Eyes. Respiratory Not Present- Bloody sputum, Chronic Cough, Difficulty Breathing, Snoring and Wheezing. Cardiovascular Not Present- Chest Pain, Difficulty Breathing Lying Down, Leg Cramps, Palpitations, Rapid Heart Rate, Shortness of Breath and Swelling of Extremities. Gastrointestinal Not Present- Abdominal Pain, Bloating, Bloody Stool, Change in Bowel Habits, Chronic diarrhea, Constipation, Difficulty Swallowing, Excessive gas, Gets full quickly at meals, Hemorrhoids, Indigestion, Nausea, Rectal Pain and Vomiting. Male Genitourinary Present- Nocturia. Not Present- Blood in Urine, Change in Urinary Stream, Frequency, Impotence, Painful Urination, Urgency and Urine Leakage.  Vitals (Kelly Dockery LPN; 03/07/2015 1:54 PM) 03/07/2015 1:53 PM Weight: 200.2 lb Height: 74in Body Surface Area: 2.18 m Body Mass Index: 25.7 kg/m Temp.: 98.4F(Oral)  Pulse: 85 (Regular)  BP: 130/80 (Sitting, Left Arm, Standard)     Physical Exam (Marline Morace MD; 03/07/2015 2:16 PM)  General Mental Status-Alert. General Appearance-Consistent with stated age. Hydration-Well hydrated. Voice-Normal.  Head and Neck Head-normocephalic, atraumatic with no lesions or palpable masses. Trachea-midline. Thyroid Gland Characteristics - normal size and consistency.  Eye Eyeball - Bilateral-Extraocular movements intact. Sclera/Conjunctiva -  Bilateral-No scleral icterus.  Chest and Lung Exam Chest and lung exam reveals -quiet, even and easy respiratory effort with no use of accessory muscles and on auscultation, normal breath sounds, no adventitious sounds and normal vocal resonance. Inspection Chest Wall - Normal. Back - normal.  Cardiovascular Cardiovascular   examination reveals -normal heart sounds, regular rate and rhythm with no murmurs and normal pedal pulses bilaterally.  Abdomen Inspection Inspection of the abdomen reveals - No Hernias. Palpation/Percussion Palpation and Percussion of the abdomen reveal - Soft, Non Tender, No Rebound tenderness, No Rigidity (guarding) and No hepatosplenomegaly. Auscultation Auscultation of the abdomen reveals - Bowel sounds normal.  Neurologic Neurologic evaluation reveals -alert and oriented x 3 with no impairment of recent or remote memory. Mental Status-Normal.  Musculoskeletal Global Assessment -Note:no gross deformities.  Normal Exam - Left-Upper Extremity Strength Normal and Lower Extremity Strength Normal. Normal Exam - Right-Upper Extremity Strength Normal and Lower Extremity Strength Normal.    Assessment & Plan (Myrissa Chipley MD; 03/07/2015 2:17 PM)  MALIGNANT NEOPLASM OF SIGMOID COLON (153.3  C18.7) Impression: 68-year-old male who underwent a colonoscopy by Dr. Hayes for rectal bleeding. This showed a sigmoid mass that was resected piecemeal. The base of the mass was biopsied as well. Both of these show adenocarcinoma. The area was tattooed. I have recommended colonic resection. This can be done in a minimally invasive fashion. We discussed this in detail today. All questions were answered. We will get CT scans of his chest abdomen and pelvis to evaluate for any metastatic disease. I will also have the preoperative nursing unit draw a CEA with his preoperative labs for baseline level. The surgery and anatomy were described to the patient as well as  the risks of surgery and the possible complications. These include: Bleeding, deep abdominal infections and possible wound complications such as hernia and infection, damage to adjacent structures, leak of surgical connections, which can lead to other surgeries and possibly an ostomy, possible need for other procedures, such as abscess drains in radiology, possible prolonged hospital stay, possible diarrhea from removal of part of the colon, possible constipation from narcotics, prolonged fatigue/weakness or appetite loss, possible early recurrence of of disease, possible complications of their medical problems such as heart disease or arrhythmias or lung problems, death (less than 1%). I believe the patient understands and wishes to proceed with the surgery.   

## 2015-03-29 NOTE — Anesthesia Postprocedure Evaluation (Signed)
  Anesthesia Post-op Note  Patient: Anthony [name]  Procedure(s) Performed: Procedure(s): LAPAROSCOPIC SIGMOIDECTOMY (N/A)  Patient Location: PACU  Anesthesia Type:General  Level of Consciousness: awake, alert  and oriented  Airway and Oxygen Therapy: Patient Spontanous Breathing  Post-op Pain: mild  Post-op Assessment: Post-op Vital signs reviewed and Patient's Cardiovascular Status Stable              Post-op Vital Signs: Reviewed and stable  Last Vitals:  Filed Vitals:   03/29/15 1358  BP: 103/64  Pulse: 79  Temp: 36.4 C  Resp: 12    Complications: No apparent anesthesia complications

## 2015-03-29 NOTE — Progress Notes (Signed)
Have patient remove insulin pump now per Dr. Marcell Barlow

## 2015-03-29 NOTE — Progress Notes (Signed)
Inpatient Diabetes Program Recommendations  AACE/ADA: New Consensus Statement on Inpatient Glycemic Control (2013)  Target Ranges:  Prepandial:   less than 140 mg/dL      Peak postprandial:   less than 180 mg/dL (1-2 hours)      Critically ill patients:  140 - 180 mg/dL    Results for SHADMAN, TOZZI (MRN [id number]) as of 03/29/2015 08:34  Ref. Range 03/29/2015 06:27 03/29/2015 07:19 03/29/2015 08:18  Glucose-Capillary Latest Ref Range: 65-99 mg/dL 144 (H) 121 (H) 134 (H)     -Paged by RN regarding this patient in Pre-OP.  Patient has History of Type 1 DM.  Uses insulin pump at home to control CBGs.  -Per Pre-Op RN, patient's insulin pump was removed this AM and given to patient's wife.  Insertion site was also removed b/c it was in the way of the surgical site.  Per RN, patient's wife has extra pump supplies so that patient can restart his insulin pump post-op once awake and able to do so.  -Discussed with RN my concern that Anesthesia note states patient has Type 1 DM and my concern that if patient is off of his insulin pump for too long that this scenario will place patient at higher risk for development of DKA.  RN assured me that the PACU RN will alert [place] once patient is Post-Op so collaboration with MD can be made and decision can be made regarding restart of patient's insulin pump or initiation of SQ insulin regimen.    Will follow closely today Wyn Quaker RN, MSN, CDE Diabetes Coordinator Inpatient Glycemic Control Team Team Pager: [phone] (8a-5p)

## 2015-03-29 NOTE — Progress Notes (Signed)
Insulin pump removed by patient and given to patient's wife

## 2015-03-29 NOTE — Op Note (Signed)
03/29/2015  11:53 AM  PATIENT:  Anthony [name]  69 y.o. male  Patient Care Team: Jacelyn Pi, MD as PCP - General (Endocrinology)  PRE-OPERATIVE DIAGNOSIS:  colon cancer  POST-OPERATIVE DIAGNOSIS:  colon cancer  PROCEDURE:  LAPAROSCOPIC SIGMOIDECTOMY  Surgeon(s): Leighton Ruff, MD Excell Seltzer, MD  ASSISTANT: [place]   ANESTHESIA:   local and general  EBL: 7ml Total I/O In: 1800 [I.V.:1800] Out: 170 [Urine:150; Blood:20]  Delay start of Pharmacological VTE agent (>24hrs) due to surgical blood loss or risk of bleeding:  no  DRAINS: none   SPECIMEN:  Source of Specimen:  Sigmoid colon  DISPOSITION OF SPECIMEN:  PATHOLOGY  COUNTS:  YES  PLAN OF CARE: Admit to inpatient   PATIENT DISPOSITION:  PACU - hemodynamically stable.  INDICATION:    69 y.o. M with cancer found in sigmoid polyp resected piece meal.  Cancer noted at base of resection specimen as well.   I recommended segmental resection:  The anatomy & physiology of the digestive tract was discussed.  The pathophysiology was discussed.  Natural history risks without surgery was discussed.   I worked to give an overview of the disease and the frequent need to have multispecialty involvement.  I feel the risks of no intervention will lead to serious problems that outweigh the operative risks; therefore, I recommended a partial colectomy to remove the pathology.  Laparoscopic & open techniques were discussed.   Risks such as bleeding, infection, abscess, leak, reoperation, possible ostomy, hernia, heart attack, death, and other risks were discussed.  I noted a good likelihood this will help address the problem.   Goals of post-operative recovery were discussed as well.    The patient expressed understanding & wished to proceed with surgery.  OR FINDINGS:   Patient had tattoo located in sigmoid colon.  No obvious metastatic disease on visceral parietal peritoneum or liver.  The anastomosis rests ~15 cm from  the anal verge by rigid proctoscopy.  DESCRIPTION:   Informed consent was confirmed.  The patient underwent general anaesthesia without difficulty.  The patient was positioned appropriately.  VTE prevention in place.  The patient's abdomen was clipped, prepped, & draped in a sterile fashion.  Surgical timeout confirmed our plan.  The patient was positioned in reverse Trendelenburg.  Abdominal entry was gained using open technique.  Entry was clean.  I induced carbon dioxide insufflation.  Camera inspection revealed no injury.  Extra ports were carefully placed under direct laparoscopic visualization.   I reflected the greater omentum and the upper abdomen the small bowel in the upper abdomen. I scored the base of peritoneum of the right side of the mesentery of the left colon from the ligament of Treitz to the peritoneal reflection of the mid rectum.  The patient had a tattoo located in his mid sigmoid colon.  I elevated the sigmoid mesentery and enetered into the retro-mesenteric plane. We were able to identify the left ureter and gonadal vessels. We kept those posterior within the retroperitoneum and elevated the left colon mesentery off that. I did isolated IMA pedicle but did not ligate it yet.  I continued distally and got into the avascular plane posterior to the mesorectum. This allowed me to help mobilize the rectum as well by freeing the mesorectum off the sacrum.  I mobilized the peritoneal coverings towards the peritoneal reflection on both the right and left sides of the rectum.  I could see the right and left ureters and stayed away from them.  I skeletonized the inferior mesenteric artery pedicle.  I went down to its takeoff from the aorta.  After confirming the left ureter was out of the way, I went ahead and ligated the inferior mesenteric artery pedicle with a 43mm blue load Ethicon stapler ~ 2 cm above its takeoff from the aorta.  We ensured hemostasis. I skeletonized the mesorectum at  the junction at the proximal rectum using blunt dissection & bipolar EnSeal.  I mobilized the left colon in a lateral to medial fashion off the line of Toldt up towards the splenic flexure to ensure good mobilization of the left colon to reach into the pelvis.  I skelotonized the mesorectum at the rectosigmoid junction using the Enseal.  I used two 45 mm blue load stapler fires to transect the remaining rectum.  I then enlarged the umbilical incision and placed an Goodman wound protector.  The transected colon was removed.  I placed a purse string device ~4-5 cm proximal to the tattooed area.  The proximal lesion was transected with scissors after a 2-0 Prolene was placed as a purse string.  There was good mucosal bleeding.  A 33 mm EEA anvil was placed and the prolene was tied tightly.  The anvil was placed back into the pelvis and the abdomen was re-insufflated.  An anastomosis was created without tension.  There was no leak under water when insufflated.  The pelvis was irrigated and the omentum was brought down over the intestines.  The 12 mm port in the RLQ was closed with a 0 Vicryl suture and an endoclose device.  We then switched to clean instruments, gloves, gowns and drapes.  The fascia was closed with interrupted #1 Novofil sutures.  The subcutaneous tissue was closed with 2-0 Vicryl sutures.  The skin of all incisions was closed with 4-0 Vicryl subcuticular stitches.  Sterile dressings were applied.  The patient was awakened from anesthesia and sent to the PACU in stable condition.  All counts were correct per OR staff.

## 2015-03-29 NOTE — Transfer of Care (Signed)
Immediate Anesthesia Transfer of Care Note  Patient: Anthony [name]  Procedure(s) Performed: Procedure(s): LAPAROSCOPIC SIGMOIDECTOMY (N/A)  Patient Location: PACU  Anesthesia Type:General  Level of Consciousness: sedated  Airway & Oxygen Therapy: Patient Spontanous Breathing and Patient connected to face mask oxygen  Post-op Assessment: Report given to RN and Post -op Vital signs reviewed and stable  Post vital signs: Reviewed and stable  Last Vitals:  Filed Vitals:   03/29/15 0625  BP: 120/77  Pulse: 98  Temp: 37 C  Resp: 18    Complications: No apparent anesthesia complications

## 2015-03-29 NOTE — Progress Notes (Signed)
Insulin pump discontinued earlier in Short Stay per order of Dr. Barbarann Ehlers.  Patient removed insulin pump needle due to being  in surgical site area.  CBG presently 134 mg/dl.  Diabetes coordinator Jeanine notified of pump discontinuation.  CBGs wiil be followed at least hourly during surgery.  PACU to notify diabetes coordinator to evaluate postoperatively for management.

## 2015-03-29 NOTE — Progress Notes (Signed)
Inpatient Diabetes Program Recommendations  AACE/ADA: New Consensus Statement on Inpatient Glycemic Control (2013)  Target Ranges:  Prepandial:   less than 140 mg/dL      Peak postprandial:   less than 180 mg/dL (1-2 hours)      Critically ill patients:  140 - 180 mg/dL   Reason for Visit: Insulin Pump  Diabetes history: DM1.5 Outpatient Diabetes medications: insulin pump Current orders for Inpatient glycemic control: Insulin pump  Results for Anthony [name], Anthony [name] (MRN [id number]) as of 03/29/2015 16:01  Ref. Range 03/29/2015 10:27 03/29/2015 11:27 03/29/2015 11:59 03/29/2015 13:15 03/29/2015 15:43  Glucose-Capillary Latest Ref Range: 65-99 mg/dL 166 (H) 193 (H) 176 (H) 220 (H) 273 (H)  Results for [name], [name] (MRN [id number]) as of 03/29/2015 16:01  Ref. Range 03/22/2015 09:25  Hemoglobin A1C Latest Ref Range: 4.8-5.6 % 56.83 (H)   69 year old male admitted for colectomy - colon ca. Insulin pump at home. Basal - .65 units/hr Bolus - CF - 50    CHO ratio - 1:15  Has pump and supplies. Ready to restart pump. Received Decadron 10 mg at 0925.  Inpatient Diabetes Program Recommendations Insulin - Basal: Restart insulin pump per home use HgbA1C: 7.6% Diet: CL - Advance to CHO mod med when tolerated  Note: Discussed above with RN. Pt contract on chart.   Thank you. Lorenda Peck, RD, LDN, CDE Inpatient Diabetes Coordinator [phone]

## 2015-03-29 NOTE — Interval H&P Note (Signed)
History and Physical Interval Note:  03/29/2015 8:15 AM  Anthony [name]  has presented today for surgery, with the diagnosis of colon cancer  The various methods of treatment have been discussed with the patient and family. After consideration of risks, benefits and other options for treatment, the patient has consented to  Procedure(s): LAPAROSCOPIC PARTIAL COLECTOMY (N/A) as a surgical intervention .  The patient's history has been reviewed, patient examined, no change in status, stable for surgery.  I have reviewed the patient's chart and labs.  Questions were answered to the patient's satisfaction.     Rosario Adie, MD  [facility]

## 2015-03-29 NOTE — Anesthesia Preprocedure Evaluation (Addendum)
Anesthesia Evaluation  Patient identified by MRN, date of birth, ID band Patient awake    Reviewed: Allergy & Precautions, NPO status , Patient's Chart, lab work & pertinent test results  Airway Mallampati: III  TM Distance: >3 FB Neck ROM: Full    Dental  (+) Teeth Intact   Pulmonary former smoker,  breath sounds clear to auscultation        Cardiovascular hypertension, Pt. on medications Rhythm:Regular Rate:Normal  No active cardiac symptoms.    Neuro/Psych PSYCHIATRIC DISORDERS Depression    GI/Hepatic Neg liver ROS, GERD-  Medicated,  Endo/Other  diabetes, Well Controlled, Type 1, Insulin Dependent  Renal/GU negative Renal ROS  negative genitourinary   Musculoskeletal negative musculoskeletal ROS (+)   Abdominal   Peds negative pediatric ROS (+)  Hematology negative hematology ROS (+)   Anesthesia Other Findings   Reproductive/Obstetrics negative OB ROS                           Lab Results  Component Value Date   WBC 9.2 03/22/2015   HGB 13.6 03/22/2015   HCT 41.2 03/22/2015   MCV 92.6 03/22/2015   PLT 302 03/22/2015   Lab Results  Component Value Date   CREATININE 1.08 03/22/2015   BUN 7 03/22/2015   NA 140 03/22/2015   K 4.2 03/22/2015   CL 106 03/22/2015   CO2 25 03/22/2015   EKG: normal sinus rhythm  Anesthesia Physical Anesthesia Plan  ASA: II  Anesthesia Plan: General   Post-op Pain Management:    Induction: Intravenous  Airway Management Planned: Oral ETT  Additional Equipment:   Intra-op Plan:   Post-operative Plan: Extubation in OR  Informed Consent: I have reviewed the patients History and Physical, chart, labs and discussed the procedure including the risks, benefits and alternatives for the proposed anesthesia with the patient or authorized representative who has indicated his/her understanding and acceptance.   Dental advisory given  Plan  Discussed with: CRNA  Anesthesia Plan Comments:         Anesthesia Quick Evaluation

## 2015-03-30 LAB — CBC
HCT: 39.9 % (ref 39.0–52.0)
Hemoglobin: 14 g/dL (ref 13.0–17.0)
MCH: 32 pg (ref 26.0–34.0)
MCHC: 35.1 g/dL (ref 30.0–36.0)
MCV: 91.1 fL (ref 78.0–100.0)
Platelets: 249 10*3/uL (ref 150–400)
RBC: 4.38 MIL/uL (ref 4.22–5.81)
RDW: 13.1 % (ref 11.5–15.5)
WBC: 18.8 10*3/uL — ABNORMAL HIGH (ref 4.0–10.5)

## 2015-03-30 LAB — BASIC METABOLIC PANEL
Anion gap: 8 (ref 5–15)
BUN: 9 mg/dL (ref 6–20)
CO2: 22 mmol/L (ref 22–32)
Calcium: 8.8 mg/dL — ABNORMAL LOW (ref 8.9–10.3)
Chloride: 101 mmol/L (ref 101–111)
Creatinine, Ser: 1.12 mg/dL (ref 0.61–1.24)
GFR calc Af Amer: >60 mL/min (ref >60)
GFR calc non Af Amer: >60 mL/min (ref >60)
Glucose, Bld: 286 mg/dL — ABNORMAL HIGH (ref 65–99)
Potassium: 4.7 mmol/L (ref 3.5–5.1)
Sodium: 131 mmol/L — ABNORMAL LOW (ref 135–145)

## 2015-03-30 LAB — GLUCOSE, CAPILLARY
Glucose-Capillary: 289 mg/dL — ABNORMAL HIGH (ref 65–99)
Glucose-Capillary: 205 mg/dL — ABNORMAL HIGH (ref 65–99)
Glucose-Capillary: 272 mg/dL — ABNORMAL HIGH (ref 65–99)
Glucose-Capillary: 276 mg/dL — ABNORMAL HIGH (ref 65–99)
Glucose-Capillary: 212 mg/dL — ABNORMAL HIGH (ref 65–99)

## 2015-03-30 MED ORDER — LISINOPRIL 40 MG PO TABS
40.0000 mg | ORAL_TABLET | Freq: Every day | ORAL | Status: DC
  Administered 2015-03-30 – 2015-04-02 (×4): 40 mg via ORAL
  Filled 2015-03-30 (×4): qty 1

## 2015-03-30 NOTE — Progress Notes (Signed)
Patient check his CBG and by his machine it said 327 and by our machine is 289. So the patient and I decided to do a 1 unit of insulin at this time.  Told patient that his machine probably needed to be checked by high and lows to make sure its correct.  Explained to patient and wife that I feel safe with our numbers bc our machines are checked with high and lows once a day.  Patient and wife understood.

## 2015-03-30 NOTE — Clinical Documentation Improvement (Signed)
General Surgery  Abnormal Lab/Test Results:  Na+ 131  Please provide a diagnosis associated with the above clinical indicator and document findings in next progress note and discharge summary if clinically significant.   Other Condition  Cannot Clinically Determine  Supporting Information: S/P bowel resection  Treatment Provided:  IV infusion of: D5 1/2 NS with 73meq's of KCL at 75cc/hr  Please exercise your independent, professional judgment when responding. A specific answer is not anticipated or expected.  Thank You,  Zoila Shutter BSN, [place] [phone]

## 2015-03-30 NOTE — Progress Notes (Signed)
Patient ID: Anthony [name], male   DOB: October 13, 1945, 69 y.o.   MRN: [id number]  [facility]  POD#: 1  Subjective: Patient up in chair.  Tolerating clear liquid diet.  Pain controlled.  Wife at bedside.  Objective: Vital signs in last 24 hours: Temp:  [96.8 F (36 C)-98.9 F (37.2 C)] 98.2 F (36.8 C) (09/03 0620) Pulse Rate:  [62-94] 62 (09/03 0930) Resp:  [9-16] 16 (09/03 0620) BP: (98-124)/(58-81) 108/68 mmHg (09/03 0930) SpO2:  [93 %-99 %] 98 % (09/03 0620) Weight:  [90.266 kg (199 lb)] 90.266 kg (199 lb) (09/02 1400) Last BM Date: 03/29/15  Intake/Output from previous day: 09/02 0701 - 09/03 0700 In: 4096.3 [P.O.:840; I.V.:3256.3] Out: 6834 [Urine:4025; Blood:20] Intake/Output this shift:    Physical Exam: HEENT - sclerae clear, mucous membranes moist Neck - soft Chest - clear bilaterally Cor - RRR Abdomen - soft, mild distension; rare BS present; wounds dry and intact Ext - no edema, non-tender Neuro - alert & oriented, no focal deficits  Lab Results:   Recent Labs  03/30/15 0525  WBC 18.8*  HGB 14.0  HCT 39.9  PLT 249   BMET  Recent Labs  03/30/15 0525  NA 131*  K 4.7  CL 101  CO2 22  GLUCOSE 286*  BUN 9  CREATININE 1.12  CALCIUM 8.8*   PT/INR No results for input(s): LABPROT, INR in the last 72 hours. Comprehensive Metabolic Panel:    Component Value Date/Time   NA 131* 03/30/2015 0525   NA 140 03/22/2015 0925   K 4.7 03/30/2015 0525   K 4.2 03/22/2015 0925   CL 101 03/30/2015 0525   CL 106 03/22/2015 0925   CO2 22 03/30/2015 0525   CO2 25 03/22/2015 0925   BUN 9 03/30/2015 0525   BUN 7 03/22/2015 0925   CREATININE 1.12 03/30/2015 0525   CREATININE 1.08 03/22/2015 0925   GLUCOSE 286* 03/30/2015 0525   GLUCOSE 145* 03/22/2015 0925   CALCIUM 8.8* 03/30/2015 0525   CALCIUM 9.5 03/22/2015 0925    Studies/Results: No results found.  Anti-infectives: Anti-infectives    Start     Dose/Rate Route  Frequency Ordered Stop   03/29/15 2200  cefoTEtan (CEFOTAN) 2 g in dextrose 5 % 50 mL IVPB     2 g 100 mL/hr over 30 Minutes Intravenous Every 12 hours 03/29/15 1404 03/29/15 2158   03/29/15 0627  cefoTEtan (CEFOTAN) 2 g in dextrose 5 % 50 mL IVPB     2 g 100 mL/hr over 30 Minutes Intravenous On call to O.R. 03/29/15 0627 03/29/15 0910      Assessment & Plans: Status post lap sigmoid resection  Continue clear liquid diet today  Glucose control with insulin pump and SSI  Encouraged ambulation, OOB  Foley out in AM 9/4  Earnstine Regal, MD, [facility] Office: [place] 03/30/2015

## 2015-03-31 LAB — BASIC METABOLIC PANEL
Anion gap: 5 (ref 5–15)
BUN: 6 mg/dL (ref 6–20)
CO2: 26 mmol/L (ref 22–32)
Calcium: 9 mg/dL (ref 8.9–10.3)
Chloride: 105 mmol/L (ref 101–111)
Creatinine, Ser: 0.96 mg/dL (ref 0.61–1.24)
GFR calc Af Amer: >60 mL/min (ref >60)
GFR calc non Af Amer: >60 mL/min (ref >60)
Glucose, Bld: 200 mg/dL — ABNORMAL HIGH (ref 65–99)
Potassium: 4.3 mmol/L (ref 3.5–5.1)
Sodium: 136 mmol/L (ref 135–145)

## 2015-03-31 LAB — CBC
HCT: 38.4 % — ABNORMAL LOW (ref 39.0–52.0)
Hemoglobin: 12.8 g/dL — ABNORMAL LOW (ref 13.0–17.0)
MCH: 30.5 pg (ref 26.0–34.0)
MCHC: 33.3 g/dL (ref 30.0–36.0)
MCV: 91.6 fL (ref 78.0–100.0)
Platelets: 246 10*3/uL (ref 150–400)
RBC: 4.19 MIL/uL — ABNORMAL LOW (ref 4.22–5.81)
RDW: 13.3 % (ref 11.5–15.5)
WBC: 14.2 10*3/uL — ABNORMAL HIGH (ref 4.0–10.5)

## 2015-03-31 LAB — GLUCOSE, CAPILLARY
Glucose-Capillary: 156 mg/dL — ABNORMAL HIGH (ref 65–99)
Glucose-Capillary: 189 mg/dL — ABNORMAL HIGH (ref 65–99)
Glucose-Capillary: 235 mg/dL — ABNORMAL HIGH (ref 65–99)
Glucose-Capillary: 241 mg/dL — ABNORMAL HIGH (ref 65–99)
Glucose-Capillary: 265 mg/dL — ABNORMAL HIGH (ref 65–99)
Glucose-Capillary: 242 mg/dL — ABNORMAL HIGH (ref 65–99)

## 2015-03-31 NOTE — Progress Notes (Signed)
Inpatient Diabetes Program Recommendations  AACE/ADA: New Consensus Statement on Inpatient Glycemic Control (2013)  Target Ranges:  Prepandial:   less than 140 mg/dL      Peak postprandial:   less than 180 mg/dL (1-2 hours)      Critically ill patients:  140 - 180 mg/dL   Paged by RN on 03/30/2015 regarding patient's concern for his glucose meter not giving the same cbg result as the hospital meter. Spoke with patient today per phone and asked if his MD managing his insulin pump had based the settings on patient's glucose meter results. Pt stated that this is what his MD has been doing. Recommended that patient use his own meter that he has previously been using to base his bolus doses to be given per his pump. Per RN, patient would like a new cbg meter however the hospital does not house or distribute cbg meters. Instructed RN to have hospital MD write a Rx for a glucose meter per patient's insurance at discharge.  Upon review of glycemic control thus far, glucose overall fairly well controlled.  Thank you Rosita Kea, RN, MSN, CDE  Diabetes Inpatient Program Office: [phone] Pager: [phone] 8:00 am to 5:00 pm

## 2015-03-31 NOTE — Plan of Care (Signed)
Problem: Phase II Progression Outcomes Goal: Return of bowel function (flatus, BM) IF ABDOMINAL SURGERY:  Outcome: Progressing + flatus     

## 2015-03-31 NOTE — Progress Notes (Signed)
Utilization Review Completed.Anthony [name] T9/10/2014  

## 2015-03-31 NOTE — Progress Notes (Signed)
Pt's IV infiltrated.  Pt is a difficulty stick.  Dr. Excell Seltzer notified and said it is ok to leave IV out at this time.

## 2015-03-31 NOTE — Progress Notes (Signed)
Patient ID: Anthony [name], male   DOB: May 03, 1946, 69 y.o.   MRN: [id number] 2 Days Post-Op  Subjective: Reports he is doing well. Had some difficulty last night with abdominal cramping but this is better today.  Has had continued flatus. Tolerating clear liquids without difficulty, no nausea  Objective: Vital signs in last 24 hours: Temp:  [97.8 F (36.6 C)-98.7 F (37.1 C)] 97.9 F (36.6 C) (09/04 0542) Pulse Rate:  [64-90] 64 (09/04 0542) Resp:  [17-18] 17 (09/04 0542) BP: (92-116)/(64-80) 105/64 mmHg (09/04 0542) SpO2:  [93 %-100 %] 93 % (09/04 0542) Last BM Date: 03/29/15  Intake/Output from previous day: 09/03 0701 - 09/04 0700 In: 1800 [I.V.:1800] Out: 4300 [Urine:4300] Intake/Output this shift:    General appearance: alert, cooperative and no distress GI: normal findings: soft, non-tender and nondistended Incision/Wound: clean and dry without evidence of infection  Lab Results:   Recent Labs  03/30/15 0525 03/31/15 0504  WBC 18.8* 14.2*  HGB 14.0 12.8*  HCT 39.9 38.4*  PLT 249 246   BMET  Recent Labs  03/30/15 0525 03/31/15 0504  NA 131* 136  K 4.7 4.3  CL 101 105  CO2 22 26  GLUCOSE 286* 200*  BUN 9 6  CREATININE 1.12 0.96  CALCIUM 8.8* 9.0   CBG (last 3)   Recent Labs  03/30/15 2149 03/31/15 0143 03/31/15 0754  GLUCAP 212* 156* 189*      Studies/Results: No results found.  Anti-infectives: Anti-infectives    Start     Dose/Rate Route Frequency Ordered Stop   03/29/15 2200  cefoTEtan (CEFOTAN) 2 g in dextrose 5 % 50 mL IVPB     2 g 100 mL/hr over 30 Minutes Intravenous Every 12 hours 03/29/15 1404 03/29/15 2158   03/29/15 0627  cefoTEtan (CEFOTAN) 2 g in dextrose 5 % 50 mL IVPB     2 g 100 mL/hr over 30 Minutes Intravenous On call to O.R. 03/29/15 4888 03/29/15 0910      Assessment/Plan: s/p Procedure(s): LAPAROSCOPIC SIGMOIDECTOMY Doing well postoperatively without apparent complication. Advance to full liquid diet.   LOS: 2  days    [name] 03/31/2015

## 2015-04-01 LAB — BASIC METABOLIC PANEL
Anion gap: 5 (ref 5–15)
BUN: <5 mg/dL — ABNORMAL LOW (ref 6–20)
CO2: 28 mmol/L (ref 22–32)
Calcium: 9.4 mg/dL (ref 8.9–10.3)
Chloride: 103 mmol/L (ref 101–111)
Creatinine, Ser: 0.92 mg/dL (ref 0.61–1.24)
GFR calc Af Amer: >60 mL/min (ref >60)
GFR calc non Af Amer: >60 mL/min (ref >60)
Glucose, Bld: 164 mg/dL — ABNORMAL HIGH (ref 65–99)
Potassium: 4.2 mmol/L (ref 3.5–5.1)
Sodium: 136 mmol/L (ref 135–145)

## 2015-04-01 LAB — CBC
HCT: 40.7 % (ref 39.0–52.0)
Hemoglobin: 13.5 g/dL (ref 13.0–17.0)
MCH: 30.6 pg (ref 26.0–34.0)
MCHC: 33.2 g/dL (ref 30.0–36.0)
MCV: 92.3 fL (ref 78.0–100.0)
Platelets: 246 10*3/uL (ref 150–400)
RBC: 4.41 MIL/uL (ref 4.22–5.81)
RDW: 13.2 % (ref 11.5–15.5)
WBC: 11.8 10*3/uL — ABNORMAL HIGH (ref 4.0–10.5)

## 2015-04-01 LAB — GLUCOSE, CAPILLARY
Glucose-Capillary: 162 mg/dL — ABNORMAL HIGH (ref 65–99)
Glucose-Capillary: 156 mg/dL — ABNORMAL HIGH (ref 65–99)
Glucose-Capillary: 256 mg/dL — ABNORMAL HIGH (ref 65–99)
Glucose-Capillary: 263 mg/dL — ABNORMAL HIGH (ref 65–99)
Glucose-Capillary: 228 mg/dL — ABNORMAL HIGH (ref 65–99)

## 2015-04-01 NOTE — Progress Notes (Signed)
Patient ID: Anthony [name], male   DOB: May 06, 1946, 69 y.o.   MRN: [id number] 3 Days Post-Op  Subjective: No complaints. IV infiltrated and he was a hard stick so we left about. He is tolerating full liquids without difficulty. No nausea or vomiting. Having flatus but no bowel movements yet. Denies abdominal pain.  Objective: Vital signs in last 24 hours: Temp:  [97.7 F (36.5 C)-99.1 F (37.3 C)] 99.1 F (37.3 C) (09/05 0630) Pulse Rate:  [64-73] 73 (09/05 0630) Resp:  [18-20] 18 (09/05 0630) BP: (104-122)/(74-78) 114/75 mmHg (09/05 0630) SpO2:  [96 %-98 %] 96 % (09/05 0630) Last BM Date: 03/27/15  Intake/Output from previous day: 09/04 0701 - 09/05 0700 In: 506.3 [I.V.:506.3] Out: 3100 [Urine:3100] Intake/Output this shift: Total I/O In: 360 [P.O.:360] Out: 200 [Urine:200]  General appearance: alert, cooperative and no distress GI: normal findings: soft, non-tender Incision/Wound: clean and dry without evidence of infection  Lab Results:   Recent Labs  03/31/15 0504 04/01/15 0504  WBC 14.2* 11.8*  HGB 12.8* 13.5  HCT 38.4* 40.7  PLT 246 246   BMET  Recent Labs  03/31/15 0504 04/01/15 0504  NA 136 136  K 4.3 4.2  CL 105 103  CO2 26 28  GLUCOSE 200* 164*  BUN 6 <5*  CREATININE 0.96 0.92  CALCIUM 9.0 9.4     Studies/Results: No results found.  Anti-infectives: Anti-infectives    Start     Dose/Rate Route Frequency Ordered Stop   03/29/15 2200  cefoTEtan (CEFOTAN) 2 g in dextrose 5 % 50 mL IVPB     2 g 100 mL/hr over 30 Minutes Intravenous Every 12 hours 03/29/15 1404 03/29/15 2158   03/29/15 0627  cefoTEtan (CEFOTAN) 2 g in dextrose 5 % 50 mL IVPB     2 g 100 mL/hr over 30 Minutes Intravenous On call to O.R. 03/29/15 4098 03/29/15 0910      Assessment/Plan: s/p Procedure(s): LAPAROSCOPIC SIGMOIDECTOMY Doing well without complication. Fluids encouraged as IV is out. Advanced to regular diet.   LOS: 3 days    Johngabriel Verde T 04/01/2015

## 2015-04-02 ENCOUNTER — Encounter (HOSPITAL_COMMUNITY): Payer: Self-pay | Admitting: General Surgery

## 2015-04-02 LAB — GLUCOSE, CAPILLARY
Glucose-Capillary: 197 mg/dL — ABNORMAL HIGH (ref 65–99)
Glucose-Capillary: 165 mg/dL — ABNORMAL HIGH (ref 65–99)

## 2015-04-02 MED ORDER — HYDROCODONE-ACETAMINOPHEN 5-325 MG PO TABS: 1.0000 | ORAL_TABLET | ORAL | Status: DC | PRN

## 2015-04-02 NOTE — Progress Notes (Signed)
Patient's vital signs stable, tolerating regular diet, walking in hall, has had several BM's.  Discharge instructions reviewed with patient and his spouse.  Patient verbalizes understanding.  Prescriptions given to patient.

## 2015-04-02 NOTE — Discharge Summary (Signed)
Physician Discharge Summary  Patient ID: Anthony [name] MRN: [id number] DOB/AGE: [birth year] 69 y.o.  Admit date: 03/29/2015 Discharge date: 04/02/2015  Admission Diagnoses: Colon cancer  Discharge Diagnoses:  Active Problems:   Colon cancer   Discharged Condition: good  Hospital Course: Patient was admitted after surgery.  His diet was advanced slowly.  He began to have bowel function on POD 3.  His pain was controlled by POD 4.  He was in stable condition for discharge home.  Consults: None  Significant Diagnostic Studies: labs: cbc, chemistry  Treatments: IV hydration, analgesia: Vicodin and surgery: lap sigmoidectomy   Discharge Exam: Blood pressure 112/72, pulse 76, temperature 98 F (36.7 C), temperature source Oral, resp. rate 18, height 6\' 2"  (1.88 m), weight 90.266 kg (199 lb), SpO2 95 %. Head: Normocephalic, without obvious abnormality, atraumatic GI: normal findings: umbilicus incision healing well.  abd soft, non-distended   Disposition: Home    Medication List    ASK your doctor about these medications        amLODipine 10 MG tablet  Commonly known as:  NORVASC  Take 10 mg by mouth every morning.     aspirin 325 MG tablet  Take 325 mg by mouth every 4 (four) hours as needed for headache.     fosinopril 40 MG tablet  Commonly known as:  MONOPRIL  Take 40 mg by mouth every morning.     multivitamin with minerals Tabs tablet  Take 1 tablet by mouth daily.     NOVOLIN R 100 units/mL injection  Generic drug:  insulin regular  Inject 0.5-6 Units into the skin 3 (three) times daily before meals.     Omega 3 1000 MG Caps  Take 1 capsule by mouth daily.     pantoprazole 40 MG tablet  Commonly known as:  PROTONIX  Take 40 mg by mouth daily.           Follow-up Information    Follow up with Rosario Adie., MD. Schedule an appointment as soon as possible for a visit in 2 weeks.   Specialty:  General Surgery   Contact information:   [address] [place] [address] [phone]       Signed: Rosario Adie 03/29/5700, 7:79 AM

## 2015-04-02 NOTE — Discharge Instructions (Signed)

## 2015-04-02 NOTE — Progress Notes (Signed)
Pharmacy Brief Note - Alvimopan (Entereg)  The standing order set for alvimopan (Entereg) now includes an automatic order to discontinue the drug after the patient has had a bowel movement. The change was approved by the [place] and the Medical Executive Committee.   This patient has had bowel movements documented by nursing. Therefore, alvimopan has been discontinued. If there are questions, please contact the pharmacy at [phone].   Dia Sitter, PharmD, BCPS 04/02/2015 8:13 AM

## 2015-04-17 DIAGNOSIS — H40032 Anatomical narrow angle, left eye: Secondary | ICD-10-CM | POA: Diagnosis not present

## 2015-05-01 DIAGNOSIS — H40033 Anatomical narrow angle, bilateral: Secondary | ICD-10-CM | POA: Diagnosis not present

## 2015-05-06 DIAGNOSIS — Z23 Encounter for immunization: Secondary | ICD-10-CM | POA: Diagnosis not present

## 2015-05-06 DIAGNOSIS — I10 Essential (primary) hypertension: Secondary | ICD-10-CM | POA: Diagnosis not present

## 2015-05-06 DIAGNOSIS — Z1389 Encounter for screening for other disorder: Secondary | ICD-10-CM | POA: Diagnosis not present

## 2015-05-06 DIAGNOSIS — R749 Abnormal serum enzyme level, unspecified: Secondary | ICD-10-CM | POA: Diagnosis not present

## 2015-05-06 DIAGNOSIS — Z125 Encounter for screening for malignant neoplasm of prostate: Secondary | ICD-10-CM | POA: Diagnosis not present

## 2015-05-06 DIAGNOSIS — Z85038 Personal history of other malignant neoplasm of large intestine: Secondary | ICD-10-CM | POA: Diagnosis not present

## 2015-05-06 DIAGNOSIS — E1165 Type 2 diabetes mellitus with hyperglycemia: Secondary | ICD-10-CM | POA: Diagnosis not present

## 2015-05-06 DIAGNOSIS — E7800 Pure hypercholesterolemia, unspecified: Secondary | ICD-10-CM | POA: Diagnosis not present

## 2015-05-14 DIAGNOSIS — G609 Hereditary and idiopathic neuropathy, unspecified: Secondary | ICD-10-CM | POA: Diagnosis not present

## 2015-05-14 DIAGNOSIS — Z23 Encounter for immunization: Secondary | ICD-10-CM | POA: Diagnosis not present

## 2015-05-14 DIAGNOSIS — I10 Essential (primary) hypertension: Secondary | ICD-10-CM | POA: Diagnosis not present

## 2015-05-14 DIAGNOSIS — E7800 Pure hypercholesterolemia, unspecified: Secondary | ICD-10-CM | POA: Diagnosis not present

## 2015-05-14 DIAGNOSIS — E1165 Type 2 diabetes mellitus with hyperglycemia: Secondary | ICD-10-CM | POA: Diagnosis not present

## 2015-05-16 ENCOUNTER — Other Ambulatory Visit: Payer: Self-pay | Admitting: General Surgery

## 2015-05-16 DIAGNOSIS — K6289 Other specified diseases of anus and rectum: Secondary | ICD-10-CM | POA: Diagnosis not present

## 2015-05-24 ENCOUNTER — Encounter (HOSPITAL_BASED_OUTPATIENT_CLINIC_OR_DEPARTMENT_OTHER): Payer: Self-pay | Admitting: *Deleted

## 2015-05-24 NOTE — Progress Notes (Addendum)
NPO AFTER MN.  ARRIVE AT 1000.  NEEDS ISTAT. CURRENT EKG IN CHART AND EPIC.  WILL TAKE NORVASC AM DOS W/ SIPS OF WATER.  SPOKE W/ WIFE ABOUT TIME CHANGE FOR PROCEDURE.  PT TO ARRIVE AT 0730 INSTEAD  OF 1000.

## 2015-05-28 NOTE — Anesthesia Preprocedure Evaluation (Addendum)
Anesthesia Evaluation  Patient identified by MRN, date of birth, ID band Patient awake    Reviewed: Allergy & Precautions, NPO status , Patient's Chart, lab work & pertinent test results  Airway Mallampati: III  TM Distance: >3 FB Neck ROM: Full    Dental no notable dental hx. (+) Teeth Intact, Dental Advisory Given   Pulmonary former smoker,    Pulmonary exam normal breath sounds clear to auscultation       Cardiovascular hypertension, Pt. on medications Normal cardiovascular exam Rhythm:Regular Rate:Normal  No active cardiac symptoms.    Neuro/Psych PSYCHIATRIC DISORDERS Depression Peripheral neuropathy  Neuromuscular disease    GI/Hepatic Neg liver ROS, GERD  Medicated,Colon cancer   Endo/Other  diabetes, Well Controlled, Type 1, Insulin Dependent  Renal/GU negative Renal ROS  negative genitourinary   Musculoskeletal negative musculoskeletal ROS (+)   Abdominal   Peds negative pediatric ROS (+)  Hematology negative hematology ROS (+)   Anesthesia Other Findings   Reproductive/Obstetrics negative OB ROS                            Anesthesia Physical Anesthesia Plan  ASA: III  Anesthesia Plan: MAC   Post-op Pain Management:    Induction:   Airway Management Planned:   Additional Equipment:   Intra-op Plan:   Post-operative Plan:   Informed Consent:   Plan Discussed with: Surgeon  Anesthesia Plan Comments:         Anesthesia Quick Evaluation

## 2015-05-29 ENCOUNTER — Ambulatory Visit (HOSPITAL_BASED_OUTPATIENT_CLINIC_OR_DEPARTMENT_OTHER): Payer: Medicare Other | Admitting: Anesthesiology

## 2015-05-29 ENCOUNTER — Encounter (HOSPITAL_BASED_OUTPATIENT_CLINIC_OR_DEPARTMENT_OTHER): Payer: Self-pay | Admitting: Anesthesiology

## 2015-05-29 ENCOUNTER — Ambulatory Visit (HOSPITAL_BASED_OUTPATIENT_CLINIC_OR_DEPARTMENT_OTHER)
Admission: RE | Admit: 2015-05-29 | Discharge: 2015-05-29 | Disposition: A | Discharge status: Home / Self Care | Payer: Medicare Other | Source: Ambulatory Visit | Attending: General Surgery | Admitting: General Surgery

## 2015-05-29 ENCOUNTER — Encounter (HOSPITAL_BASED_OUTPATIENT_CLINIC_OR_DEPARTMENT_OTHER)
Admission: RE | Disposition: A | Discharge status: Home / Self Care | Payer: Self-pay | Source: Ambulatory Visit | Attending: General Surgery

## 2015-05-29 DIAGNOSIS — K5669 Other intestinal obstruction: Secondary | ICD-10-CM | POA: Diagnosis not present

## 2015-05-29 DIAGNOSIS — Z87891 Personal history of nicotine dependence: Secondary | ICD-10-CM | POA: Diagnosis not present

## 2015-05-29 DIAGNOSIS — K219 Gastro-esophageal reflux disease without esophagitis: Secondary | ICD-10-CM | POA: Insufficient documentation

## 2015-05-29 DIAGNOSIS — E119 Type 2 diabetes mellitus without complications: Secondary | ICD-10-CM | POA: Diagnosis not present

## 2015-05-29 DIAGNOSIS — E7800 Pure hypercholesterolemia, unspecified: Secondary | ICD-10-CM | POA: Insufficient documentation

## 2015-05-29 DIAGNOSIS — K648 Other hemorrhoids: Secondary | ICD-10-CM | POA: Diagnosis not present

## 2015-05-29 DIAGNOSIS — Z980 Intestinal bypass and anastomosis status: Secondary | ICD-10-CM | POA: Insufficient documentation

## 2015-05-29 DIAGNOSIS — K6289 Other specified diseases of anus and rectum: Secondary | ICD-10-CM | POA: Diagnosis not present

## 2015-05-29 DIAGNOSIS — Z9049 Acquired absence of other specified parts of digestive tract: Secondary | ICD-10-CM | POA: Diagnosis not present

## 2015-05-29 DIAGNOSIS — Z79899 Other long term (current) drug therapy: Secondary | ICD-10-CM | POA: Diagnosis not present

## 2015-05-29 DIAGNOSIS — I10 Essential (primary) hypertension: Secondary | ICD-10-CM | POA: Diagnosis not present

## 2015-05-29 DIAGNOSIS — K5660 Unspecified intestinal obstruction: Secondary | ICD-10-CM | POA: Diagnosis not present

## 2015-05-29 DIAGNOSIS — K641 Second degree hemorrhoids: Secondary | ICD-10-CM | POA: Diagnosis not present

## 2015-05-29 DIAGNOSIS — Z85038 Personal history of other malignant neoplasm of large intestine: Secondary | ICD-10-CM | POA: Insufficient documentation

## 2015-05-29 DIAGNOSIS — Z7984 Long term (current) use of oral hypoglycemic drugs: Secondary | ICD-10-CM | POA: Insufficient documentation

## 2015-05-29 HISTORY — DX: Unspecified hemorrhoids: K64.9

## 2015-05-29 HISTORY — DX: Personal history of colonic polyps: Z86.010

## 2015-05-29 HISTORY — DX: Type 2 diabetes mellitus without complications: E11.9

## 2015-05-29 HISTORY — DX: Personal history of adenomatous and serrated colon polyps: Z86.0101

## 2015-05-29 HISTORY — DX: Type 2 diabetes mellitus without complications: Z79.4

## 2015-05-29 HISTORY — PX: EVALUATION UNDER ANESTHESIA WITH HEMORRHOIDECTOMY: SHX5624

## 2015-05-29 LAB — POCT I-STAT 4, (NA,K, GLUC, HGB,HCT)
Glucose, Bld: 98 mg/dL (ref 65–99)
HCT: 48 % (ref 39.0–52.0)
Hemoglobin: 16.3 g/dL (ref 13.0–17.0)
Potassium: 3.3 mmol/L — ABNORMAL LOW (ref 3.5–5.1)
Sodium: 142 mmol/L (ref 135–145)

## 2015-05-29 LAB — GLUCOSE, CAPILLARY: Glucose-Capillary: 122 mg/dL — ABNORMAL HIGH (ref 65–99)

## 2015-05-29 SURGERY — EXAM UNDER ANESTHESIA WITH HEMORRHOIDECTOMY
Anesthesia: Monitor Anesthesia Care | Site: Rectum

## 2015-05-29 MED ORDER — MIDAZOLAM HCL 5 MG/5ML IJ SOLN
INTRAMUSCULAR | Status: DC | PRN
  Administered 2015-05-29: 2 mg via INTRAVENOUS

## 2015-05-29 MED ORDER — LIDOCAINE 5 % EX OINT
TOPICAL_OINTMENT | CUTANEOUS | Status: DC | PRN
  Administered 2015-05-29: 1

## 2015-05-29 MED ORDER — OXYCODONE HCL 5 MG PO TABS
ORAL_TABLET | ORAL | Status: AC
  Filled 2015-05-29: qty 1

## 2015-05-29 MED ORDER — 0.9 % SODIUM CHLORIDE (POUR BTL) OPTIME
TOPICAL | Status: DC | PRN
  Administered 2015-05-29: 500 mL

## 2015-05-29 MED ORDER — FENTANYL CITRATE (PF) 100 MCG/2ML IJ SOLN
25.0000 ug | INTRAMUSCULAR | Status: DC | PRN
  Filled 2015-05-29: qty 1

## 2015-05-29 MED ORDER — LACTATED RINGERS IV SOLN
INTRAVENOUS | Status: DC
  Administered 2015-05-29: 09:00:00 via INTRAVENOUS
  Filled 2015-05-29: qty 1000

## 2015-05-29 MED ORDER — BUPIVACAINE-EPINEPHRINE 0.5% -1:200000 IJ SOLN
INTRAMUSCULAR | Status: DC | PRN
  Administered 2015-05-29: 30 mL

## 2015-05-29 MED ORDER — ONDANSETRON HCL 4 MG/2ML IJ SOLN
INTRAMUSCULAR | Status: DC | PRN
  Administered 2015-05-29: 4 mg via INTRAVENOUS

## 2015-05-29 MED ORDER — ACETAMINOPHEN 325 MG PO TABS
650.0000 mg | ORAL_TABLET | ORAL | Status: DC | PRN
  Filled 2015-05-29: qty 2

## 2015-05-29 MED ORDER — INSULIN ASPART 100 UNIT/ML ~~LOC~~ SOLN
0.0000 [IU] | SUBCUTANEOUS | Status: DC
  Filled 2015-05-29: qty 0.15

## 2015-05-29 MED ORDER — HYDROCODONE-ACETAMINOPHEN 5-325 MG PO TABS: 1.0000 | ORAL_TABLET | ORAL | Status: DC | PRN

## 2015-05-29 MED ORDER — LACTATED RINGERS IV SOLN
INTRAVENOUS | Status: DC
  Filled 2015-05-29: qty 1000

## 2015-05-29 MED ORDER — PROPOFOL 500 MG/50ML IV EMUL
INTRAVENOUS | Status: DC | PRN
  Administered 2015-05-29: 75 ug/kg/min via INTRAVENOUS

## 2015-05-29 MED ORDER — LIDOCAINE HCL (CARDIAC) 20 MG/ML IV SOLN
INTRAVENOUS | Status: DC | PRN
  Administered 2015-05-29: 60 mg via INTRAVENOUS

## 2015-05-29 MED ORDER — ACETAMINOPHEN 650 MG RE SUPP
650.0000 mg | RECTAL | Status: DC | PRN
  Filled 2015-05-29: qty 1

## 2015-05-29 MED ORDER — MIDAZOLAM HCL 2 MG/2ML IJ SOLN
INTRAMUSCULAR | Status: AC
  Filled 2015-05-29: qty 4

## 2015-05-29 MED ORDER — HYDROCODONE-ACETAMINOPHEN 5-325 MG PO TABS
1.0000 | ORAL_TABLET | ORAL | Status: DC | PRN
  Filled 2015-05-29: qty 1

## 2015-05-29 MED ORDER — SODIUM CHLORIDE 0.9 % IV SOLN
250.0000 mL | INTRAVENOUS | Status: DC | PRN
  Filled 2015-05-29: qty 250

## 2015-05-29 MED ORDER — FENTANYL CITRATE (PF) 100 MCG/2ML IJ SOLN
INTRAMUSCULAR | Status: AC
  Filled 2015-05-29: qty 4

## 2015-05-29 MED ORDER — SODIUM CHLORIDE 0.9 % IJ SOLN
3.0000 mL | INTRAMUSCULAR | Status: DC | PRN
  Filled 2015-05-29: qty 3

## 2015-05-29 MED ORDER — OXYCODONE HCL 5 MG PO TABS
5.0000 mg | ORAL_TABLET | ORAL | Status: DC | PRN
  Administered 2015-05-29: 5 mg via ORAL
  Filled 2015-05-29: qty 2

## 2015-05-29 MED ORDER — FENTANYL CITRATE (PF) 100 MCG/2ML IJ SOLN
INTRAMUSCULAR | Status: DC | PRN
  Administered 2015-05-29 (×2): 50 ug via INTRAVENOUS

## 2015-05-29 MED ORDER — SODIUM CHLORIDE 0.9 % IJ SOLN
3.0000 mL | Freq: Two times a day (BID) | INTRAMUSCULAR | Status: DC
  Filled 2015-05-29: qty 3

## 2015-05-29 SURGICAL SUPPLY — 55 items
BENZOIN TINCTURE PRP APPL 2/3 (GAUZE/BANDAGES/DRESSINGS) ×3 IMPLANT
BLADE HEX COATED 2.75 (ELECTRODE) ×3 IMPLANT
BLADE SURG 10 STRL SS (BLADE) IMPLANT
BLADE SURG 15 STRL LF DISP TIS (BLADE) ×2 IMPLANT
BLADE SURG 15 STRL SS (BLADE) ×1
BRIEF STRETCH FOR OB PAD LRG (UNDERPADS AND DIAPERS) ×6 IMPLANT
CLOTH BEACON ORANGE TIMEOUT ST (SAFETY) IMPLANT
COVER BACK TABLE 60X90IN (DRAPES) ×3 IMPLANT
COVER MAYO STAND STRL (DRAPES) ×3 IMPLANT
DECANTER SPIKE VIAL GLASS SM (MISCELLANEOUS) IMPLANT
DRAIN PENROSE 18X1/2 LTX STRL (DRAIN) IMPLANT
DRAPE LG THREE QUARTER DISP (DRAPES) IMPLANT
DRAPE PED LAPAROTOMY (DRAPES) ×3 IMPLANT
DRAPE UTILITY XL STRL (DRAPES) ×3 IMPLANT
ELECT BLADE 6.5 .24CM SHAFT (ELECTRODE) IMPLANT
ELECT REM PT RETURN 9FT ADLT (ELECTROSURGICAL) ×3
ELECTRODE REM PT RTRN 9FT ADLT (ELECTROSURGICAL) ×2 IMPLANT
GAUZE SPONGE 4X4 16PLY XRAY LF (GAUZE/BANDAGES/DRESSINGS) ×3 IMPLANT
GLOVE BIO SURGEON STRL SZ 6.5 (GLOVE) ×6 IMPLANT
GLOVE INDICATOR 7.0 STRL GRN (GLOVE) ×3 IMPLANT
GOWN STRL REUS W/ TWL XL LVL3 (GOWN DISPOSABLE) ×4 IMPLANT
GOWN STRL REUS W/TWL 2XL LVL3 (GOWN DISPOSABLE) ×6 IMPLANT
GOWN STRL REUS W/TWL XL LVL3 (GOWN DISPOSABLE) ×2
KIT ROOM TURNOVER WOR (KITS) ×3 IMPLANT
LOOP VESSEL MAXI BLUE (MISCELLANEOUS) IMPLANT
NDL SAFETY ECLIPSE 18X1.5 (NEEDLE) IMPLANT
NEEDLE HYPO 18GX1.5 SHARP (NEEDLE)
NEEDLE HYPO 22GX1.5 SAFETY (NEEDLE) ×3 IMPLANT
NEEDLE HYPO 25X1 1.5 SAFETY (NEEDLE) ×3 IMPLANT
NS IRRIG 500ML POUR BTL (IV SOLUTION) ×3 IMPLANT
PACK BASIN DAY SURGERY FS (CUSTOM PROCEDURE TRAY) ×3 IMPLANT
PAD ABD 8X10 STRL (GAUZE/BANDAGES/DRESSINGS) ×3 IMPLANT
PAD ARMBOARD 7.5X6 YLW CONV (MISCELLANEOUS) ×3 IMPLANT
PENCIL BUTTON HOLSTER BLD 10FT (ELECTRODE) ×3 IMPLANT
SPONGE GAUZE 4X4 12PLY (GAUZE/BANDAGES/DRESSINGS) ×3 IMPLANT
SPONGE GAUZE 4X4 12PLY STER LF (GAUZE/BANDAGES/DRESSINGS) ×3 IMPLANT
SPONGE SURGIFOAM ABS GEL 100 (HEMOSTASIS) IMPLANT
SPONGE SURGIFOAM ABS GEL 12-7 (HEMOSTASIS) IMPLANT
STAPLER VISISTAT 35W (STAPLE) IMPLANT
SUT CHROMIC 2 0 SH (SUTURE) IMPLANT
SUT CHROMIC 3 0 SH 27 (SUTURE) IMPLANT
SUT ETHIBOND 0 (SUTURE) IMPLANT
SUT PROLENE 2 0 BLUE (SUTURE) IMPLANT
SUT VIC AB 3-0 SH 27 (SUTURE) ×1
SUT VIC AB 3-0 SH 27X BRD (SUTURE) ×2 IMPLANT
SUT VIC AB 4-0 P-3 18XBRD (SUTURE) IMPLANT
SUT VIC AB 4-0 P3 18 (SUTURE)
SUT VIC AB 4-0 SH 18 (SUTURE) IMPLANT
SYR CONTROL 10ML LL (SYRINGE) ×3 IMPLANT
TOWEL OR 17X24 6PK STRL BLUE (TOWEL DISPOSABLE) ×6 IMPLANT
TRAY DSU PREP LF (CUSTOM PROCEDURE TRAY) ×3 IMPLANT
TUBE CONNECTING 12X1/4 (SUCTIONS) ×3 IMPLANT
UNDERPAD 30X30 INCONTINENT (UNDERPADS AND DIAPERS) ×3 IMPLANT
WATER STERILE IRR 500ML POUR (IV SOLUTION) IMPLANT
YANKAUER SUCT BULB TIP NO VENT (SUCTIONS) ×3 IMPLANT

## 2015-05-29 NOTE — Transfer of Care (Signed)
Immediate Anesthesia Transfer of Care Note  Patient: Anthony [name]  Procedure(s) Performed: Procedure(s): ANAL EXAM UNDER ANESTHESIA WITH  HEMORRHOIDOPEXY; RIGID SIGMOIDOSCOPY (N/A)  Patient Location: PACU  Anesthesia Type:MAC  Level of Consciousness: awake, alert  and oriented  Airway & Oxygen Therapy: Patient Spontanous Breathing and Patient connected to nasal cannula oxygen  Post-op Assessment: Report given to RN  Post vital signs: Reviewed and stable  Last Vitals:  Filed Vitals:   05/29/15 0722  BP: 135/88  Pulse: 81  Temp: 37 C  Resp: 16    Complications: No apparent anesthesia complications

## 2015-05-29 NOTE — H&P (Signed)
The patient is a 69 year old male who presents with colorectal cancer. 69 year old male who presents to [place] as a referral from Dr. Amedeo Plenty. He underwent a colonoscopy due to rectal bleeding. On exam a 30 mm polyp was found in the proximal sigmoid colon. This was semi-sessile and removed using piecemeal technique with snare cautery. The polyp resection was incomplete. He underwent a sigmoidectomy with negative pathology.  Since that time he has had anal pain with BM's.  A fissure was seen in the office.  He has tried medical management with diltiazem ointment, but his symptoms are still present.    Other Problems Mammie Lorenzo, LPN; 01/17/7627 3:15 PM) Diabetes Mellitus Gastroesophageal Reflux Disease High blood pressure Hypercholesterolemia Pancreatitis  Past Surgical History Mammie Lorenzo, LPN; 1/76/1607 3:71 PM) Oral Surgery  Allergies Mammie Lorenzo, LPN; 0/62/6948 5:46 PM) Toradol *ANALGESICS - ANTI-INFLAMMATORY*  Medication History Mammie Lorenzo, LPN; 2/70/3500 9:38 PM) Neurontin (400MG  Capsule, Oral) Active. Lotrel (10-20MG  Capsule, Oral) Active. NovoLIN N (100UNIT/ML Suspension, Subcutaneous) Active. Protonix (40MG  Packet, Oral) Active. Medications Reconciled  Social History Mammie Lorenzo, LPN; 1/82/9937 1:69 PM) Alcohol use Moderate alcohol use. Caffeine use Carbonated beverages. No drug use Tobacco use Former smoker.  Family History Mammie Lorenzo, LPN; 6/78/9381 0:17 PM) Arthritis Mother. Hypertension Father. Prostate Cancer Father.     Review of Systems  General Not Present- Appetite Loss, Chills, Fatigue, Fever, Night Sweats, Weight Gain and Weight Loss. Skin Not Present- Change in Wart/Mole, Dryness, Hives, Jaundice, New Lesions, Non-Healing Wounds, Rash and Ulcer. HEENT Present- Ringing in the Ears and Seasonal Allergies. Not Present- Earache, Hearing Loss, Hoarseness, Nose Bleed, Oral Ulcers, Sinus Pain, Sore Throat, Visual Disturbances,  Wears glasses/contact lenses and Yellow Eyes. Respiratory Not Present- Bloody sputum, Chronic Cough, Difficulty Breathing, Snoring and Wheezing. Cardiovascular Not Present- Chest Pain, Difficulty Breathing Lying Down, Leg Cramps, Palpitations, Rapid Heart Rate, Shortness of Breath and Swelling of Extremities. Gastrointestinal Present- Anal Pain  Not Present: Bloating, Bloody Stool, Change in Bowel Habits, Chronic diarrhea, Constipation, Difficulty Swallowing, Excessive gas, Gets full quickly at meals, Hemorrhoids, Indigestion, Nausea, Rectal Pain and Vomiting. Male Genitourinary Present- Nocturia. Not Present- Blood in Urine, Change in Urinary Stream, Frequency, Impotence, Painful Urination, Urgency and Urine Leakage.  BP 135/88 mmHg  Pulse 81  Temp(Src) 98.6 F (37 C) (Oral)  Resp 16  Ht 6\' 2"  (1.88 m)  Wt 84.369 kg (186 lb)  BMI 23.87 kg/m2  SpO2 100%  Physical Exam   General Mental Status-Alert. General Appearance-Consistent with stated age. Hydration-Well hydrated. Voice-Normal.  Head and Neck Head-normocephalic, atraumatic with no lesions or palpable masses. Trachea-midline. Thyroid Gland Characteristics - normal size and consistency.  Eye Eyeball - Bilateral-Extraocular movements intact. Sclera/Conjunctiva - Bilateral-No scleral icterus.  Chest and Lung Exam Chest and lung exam reveals -quiet, even and easy respiratory effort with no use of accessory muscles and on auscultation, normal breath sounds, no adventitious sounds and normal vocal resonance. Inspection Chest Wall - Normal. Back - normal.  Cardiovascular Cardiovascular examination reveals -normal heart sounds, regular rate and rhythm with no murmurs and normal pedal pulses bilaterally.  Abdomen Inspection Inspection of the abdomen reveals - No Hernias. Palpation/Percussion Palpation and Percussion of the abdomen reveal - Soft, Non Tender, No Rebound tenderness, No Rigidity (guarding) and  No hepatosplenomegaly. Auscultation Auscultation of the abdomen reveals - Bowel sounds normal.  Neurologic Neurologic evaluation reveals -alert and oriented x 3 with no impairment of recent or remote memory. Mental Status-Normal.  Musculoskeletal Global Assessment -Note:no gross deformities.  Normal  Exam - Left-Upper Extremity Strength Normal and Lower Extremity Strength Normal. Normal Exam - Right-Upper Extremity Strength Normal and Lower Extremity Strength Normal.    Assessment & Plan Leighton Ruff MD; 1/95/0932 2:17 PM)  MALIGNANT NEOPLASM OF SIGMOID COLON (153.3  C18.7) Impression: 69 year old male who underwent a colonoscopy by Dr. Amedeo Plenty for rectal bleeding. This showed a sigmoid mass that was resected piecemeal. The base of the mass was biopsied as well. Both of these show adenocarcinoma. The area was tattooed.  He underwent a sigmoidectomy for this and tolerated this well, but has developed anal pain.  A fissure was seen and the office and he tried diltiazem ointment for several weeks with no change in his symptoms.  I have recommended an anal EUA with possible chemical sphincterotomy.  We will also evaluate his hemorrhoids and do a hemorrhoidopexy if any prolapsing hemorrhoids are found.  We have discussed this in detail today. All questions were answered.

## 2015-05-29 NOTE — Discharge Instructions (Addendum)
Beginning the day after surgery:  You may sit in a tub of warm water 2-3 times a day to relieve discomfort.  Eat a regular diet high in fiber.  Avoid foods that give you constipation or diarrhea.  Avoid foods that are difficult to digest, such as seeds, nuts, corn or popcorn.  I recommend that you start a fiber supplement such as Metamucil, Benefiber or Citrucel. I recommend that you decrease the amount of MiraLAX that you are taking as well.  Do not go any longer than 2 days without a bowel movement.  You may take an extra dose of MiraLAX if you become constipated.    Drink 6-8 glasses of water daily.  Walking is encouraged.  Avoid strenuous activity and heavy lifting for one month after surgery.    Call the office if you have any questions or concerns.  Call immediately if you develop:   Excessive rectal bleeding (more than a cup or passing large clots)  Increased discomfort  Fever greater than 100 F Difficulty urinating Post Anesthesia Home Care Instructions  Activity: Get plenty of rest for the remainder of the day. A responsible adult should stay with you for 24 hours following the procedure.  For the next 24 hours, DO NOT: -Drive a car -Paediatric nurse -Drink alcoholic beverages -Take any medication unless instructed by your physician -Make any legal decisions or sign important papers.  Meals: Start with liquid foods such as gelatin or soup. Progress to regular foods as tolerated. Avoid greasy, spicy, heavy foods. If nausea and/or vomiting occur, drink only clear liquids until the nausea and/or vomiting subsides. Call your physician if vomiting continues.  Special Instructions/Symptoms: Your throat may feel dry or sore from the anesthesia or the breathing tube placed in your throat during surgery. If this causes discomfort, gargle with warm salt water. The discomfort should disappear within 24 hours.  If you had a scopolamine patch placed behind your ear for the  management of post- operative nausea and/or vomiting:  1. The medication in the patch is effective for 72 hours, after which it should be removed.  Wrap patch in a tissue and discard in the trash. Wash hands thoroughly with soap and water. 2. You may remove the patch earlier than 72 hours if you experience unpleasant side effects which may include dry mouth, dizziness or visual disturbances. 3. Avoid touching the patch. Wash your hands with soap and water after contact with the patch.

## 2015-05-29 NOTE — Op Note (Signed)
05/29/2015  9:46 AM  PATIENT:  Anthony [name]  69 y.o. male  Patient Care Team: Merrilee Seashore, MD as PCP - General (Internal Medicine)  PRE-OPERATIVE DIAGNOSIS:  anal pain  POST-OPERATIVE DIAGNOSIS:  PROLAPSED HEMORRHOID; MILD SIGMOID STRICTURE  PROCEDURE:   ANAL EXAM UNDER ANESTHESIA WITH  HEMORRHOIDOPEXY; RIGID SIGMOIDOSCOPY   Surgeon(s): Leighton Ruff, MD  ASSISTANT: none   ANESTHESIA:   local and MAC  SPECIMEN:  No Specimen  DISPOSITION OF SPECIMEN:  N/A  COUNTS:  YES  PLAN OF CARE: Discharge to home after PACU  PATIENT DISPOSITION:  PACU - hemodynamically stable.  INDICATION: 69 y.o. M s/p sigmoidectomy with post op anal pain.  Here for EUA and possible treatment   OR FINDINGS: anastomosis patent but unable to pass rigid proctoscope through this.  Grade 2 L lateral internal hemorrhoid with inflammation  DESCRIPTION: the patient was identified in the preoperative holding area and taken to the OR where they were laid on the operating room table.  MAC anesthesia was induced without difficulty. The patient was then positioned in prone jackknife position with buttocks gently taped apart.  The patient was then prepped and draped in usual sterile fashion.  SCDs were noted to be in place prior to the initiation of anesthesia. A surgical timeout was performed indicating the correct patient, procedure, positioning and need for preoperative antibiotics.  A rectal block was performed using Marcaine with epinephrine.    I began with a digital rectal exam.  There were no palpable masses. Resting tone was relatively normal.  I then placed a Hill-Ferguson anoscope into the anal canal and evaluated this completely.  There was a slightly prolapsing grade 2 internal hemorrhoid noted in the left lateral position. There were grade 1 internal hemorrhoids in the right anterior and right posterior positions.  There was no fissure. There was no stigmata of previous fissure noted. There was no  sphincter hypertension noted. I decided not to do a chemical sphincterotomy. I did perform hemorrhoidopexy of the left lateral internal hemorrhoid using a 3-0 Vicryl suture. At this point I still cannot explain all of the patient's symptoms with my findings. I decided to perform an on table enema using a rigid sigmoidoscope to evaluate the patient's anastomosis, as his symptoms were concerning for possible stricture. I was able to pass the sigmoidoscope to the level of the anastomosis. The scope would not pass through the anastomosis at this time. I did not attempt to dilate it. The anastomosis was at least 1 cm in diameter and just slightly smaller than the diameter of the sigmoidoscope. Stool easily passed through this. I think this could explain some of his symptoms and hopefully these will resolve as he gets further out from surgery. At this point we concluded the operation. Lidocaine ointment and a dressing were applied. The patient was taken to the postanesthesia care unit in stable condition. All counts were correct per operating room staff.

## 2015-05-29 NOTE — Anesthesia Postprocedure Evaluation (Signed)
  Anesthesia Post-op Note  Patient: Anthony [name]  Procedure(s) Performed: Procedure(s) (LRB): ANAL EXAM UNDER ANESTHESIA WITH  HEMORRHOIDOPEXY; RIGID SIGMOIDOSCOPY (N/A)  Patient Location: PACU  Anesthesia Type: MAC  Level of Consciousness: awake and alert   Airway and Oxygen Therapy: Patient Spontanous Breathing  Post-op Pain: mild  Post-op Assessment: Post-op Vital signs reviewed, Patient's Cardiovascular Status Stable, Respiratory Function Stable, Patent Airway and No signs of Nausea or vomiting  Last Vitals:  Filed Vitals:   05/29/15 1136  BP: 145/89  Pulse: 77  Temp: 36.4 C  Resp: 16    Post-op Vital Signs: stable   Complications: No apparent anesthesia complications

## 2015-05-29 NOTE — Anesthesia Procedure Notes (Signed)
Procedure Name: MAC Date/Time: 05/29/2015 9:05 AM Performed by: Bethena Roys T Oxygen Delivery Method: Nasal cannula Placement Confirmation: positive ETCO2

## 2015-05-30 ENCOUNTER — Encounter (HOSPITAL_BASED_OUTPATIENT_CLINIC_OR_DEPARTMENT_OTHER): Payer: Self-pay | Admitting: General Surgery

## 2015-07-15 DIAGNOSIS — I10 Essential (primary) hypertension: Secondary | ICD-10-CM | POA: Diagnosis not present

## 2015-07-15 DIAGNOSIS — R609 Edema, unspecified: Secondary | ICD-10-CM | POA: Diagnosis not present

## 2015-07-15 DIAGNOSIS — E1165 Type 2 diabetes mellitus with hyperglycemia: Secondary | ICD-10-CM | POA: Diagnosis not present

## 2015-07-15 DIAGNOSIS — E7800 Pure hypercholesterolemia, unspecified: Secondary | ICD-10-CM | POA: Diagnosis not present

## 2015-07-15 DIAGNOSIS — Z9641 Presence of insulin pump (external) (internal): Secondary | ICD-10-CM | POA: Diagnosis not present

## 2015-07-15 DIAGNOSIS — D649 Anemia, unspecified: Secondary | ICD-10-CM | POA: Diagnosis not present

## 2015-08-17 ENCOUNTER — Encounter (HOSPITAL_COMMUNITY): Payer: Self-pay | Admitting: Emergency Medicine

## 2015-08-17 ENCOUNTER — Emergency Department (HOSPITAL_COMMUNITY)
Admission: EM | Admit: 2015-08-17 | Discharge: 2015-08-17 | Disposition: A | Discharge status: Home / Self Care | Payer: Medicare Other | Attending: Emergency Medicine | Admitting: Emergency Medicine

## 2015-08-17 ENCOUNTER — Emergency Department (HOSPITAL_COMMUNITY): Payer: Medicare Other

## 2015-08-17 DIAGNOSIS — Z87891 Personal history of nicotine dependence: Secondary | ICD-10-CM | POA: Diagnosis not present

## 2015-08-17 DIAGNOSIS — J189 Pneumonia, unspecified organism: Secondary | ICD-10-CM

## 2015-08-17 DIAGNOSIS — I10 Essential (primary) hypertension: Secondary | ICD-10-CM | POA: Insufficient documentation

## 2015-08-17 DIAGNOSIS — Z86010 Personal history of colonic polyps: Secondary | ICD-10-CM | POA: Insufficient documentation

## 2015-08-17 DIAGNOSIS — Z8669 Personal history of other diseases of the nervous system and sense organs: Secondary | ICD-10-CM | POA: Insufficient documentation

## 2015-08-17 DIAGNOSIS — E785 Hyperlipidemia, unspecified: Secondary | ICD-10-CM | POA: Insufficient documentation

## 2015-08-17 DIAGNOSIS — Z8659 Personal history of other mental and behavioral disorders: Secondary | ICD-10-CM | POA: Diagnosis not present

## 2015-08-17 DIAGNOSIS — R509 Fever, unspecified: Secondary | ICD-10-CM | POA: Diagnosis not present

## 2015-08-17 DIAGNOSIS — E860 Dehydration: Secondary | ICD-10-CM | POA: Insufficient documentation

## 2015-08-17 DIAGNOSIS — K219 Gastro-esophageal reflux disease without esophagitis: Secondary | ICD-10-CM | POA: Insufficient documentation

## 2015-08-17 DIAGNOSIS — Z79899 Other long term (current) drug therapy: Secondary | ICD-10-CM | POA: Insufficient documentation

## 2015-08-17 DIAGNOSIS — J159 Unspecified bacterial pneumonia: Secondary | ICD-10-CM | POA: Insufficient documentation

## 2015-08-17 DIAGNOSIS — Z85038 Personal history of other malignant neoplasm of large intestine: Secondary | ICD-10-CM | POA: Insufficient documentation

## 2015-08-17 DIAGNOSIS — Z7982 Long term (current) use of aspirin: Secondary | ICD-10-CM | POA: Insufficient documentation

## 2015-08-17 DIAGNOSIS — Z794 Long term (current) use of insulin: Secondary | ICD-10-CM | POA: Diagnosis not present

## 2015-08-17 DIAGNOSIS — R05 Cough: Secondary | ICD-10-CM | POA: Diagnosis present

## 2015-08-17 LAB — CBC
HCT: 43.5 % (ref 39.0–52.0)
Hemoglobin: 15.1 g/dL (ref 13.0–17.0)
MCH: 30 pg (ref 26.0–34.0)
MCHC: 34.7 g/dL (ref 30.0–36.0)
MCV: 86.3 fL (ref 78.0–100.0)
Platelets: 231 10*3/uL (ref 150–400)
RBC: 5.04 MIL/uL (ref 4.22–5.81)
RDW: 13.9 % (ref 11.5–15.5)
WBC: 24.3 10*3/uL — ABNORMAL HIGH (ref 4.0–10.5)

## 2015-08-17 LAB — BASIC METABOLIC PANEL
Anion gap: 15 (ref 5–15)
BUN: 12 mg/dL (ref 6–20)
CO2: 20 mmol/L — ABNORMAL LOW (ref 22–32)
Calcium: 8.8 mg/dL — ABNORMAL LOW (ref 8.9–10.3)
Chloride: 102 mmol/L (ref 101–111)
Creatinine, Ser: 1.26 mg/dL — ABNORMAL HIGH (ref 0.61–1.24)
GFR calc Af Amer: >60 mL/min (ref >60)
GFR calc non Af Amer: 57 mL/min — ABNORMAL LOW (ref >60)
Glucose, Bld: 219 mg/dL — ABNORMAL HIGH (ref 65–99)
Potassium: 3.1 mmol/L — ABNORMAL LOW (ref 3.5–5.1)
Sodium: 137 mmol/L (ref 135–145)

## 2015-08-17 IMAGING — DX DG CHEST 2V
2 series · 2 of 2 positions shown · non-contrast
Comparison: [DATE]

CLINICAL DATA: Cough for 1 day.  Fever.

EXAM:
CHEST  2 VIEW

[chest pa]
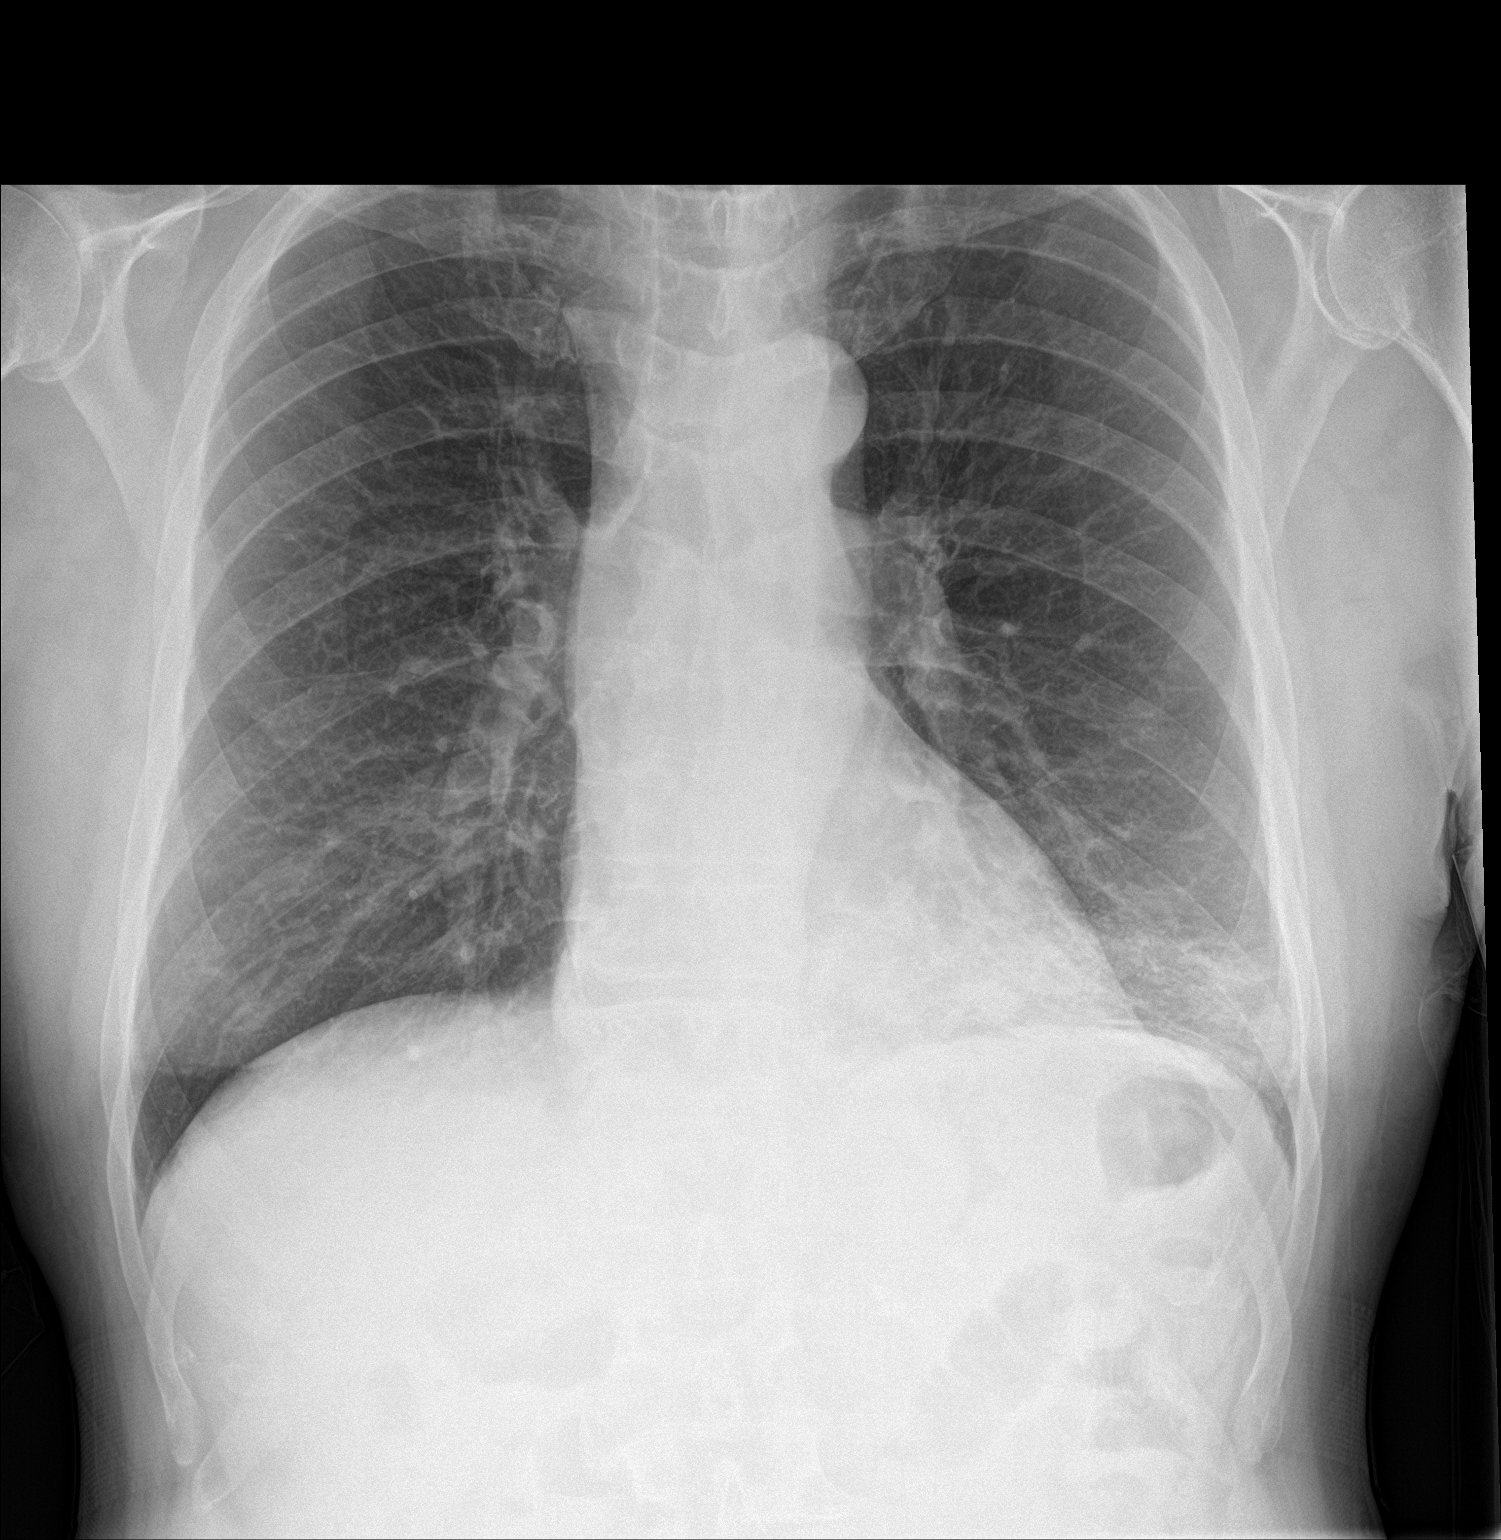

[chest lat]
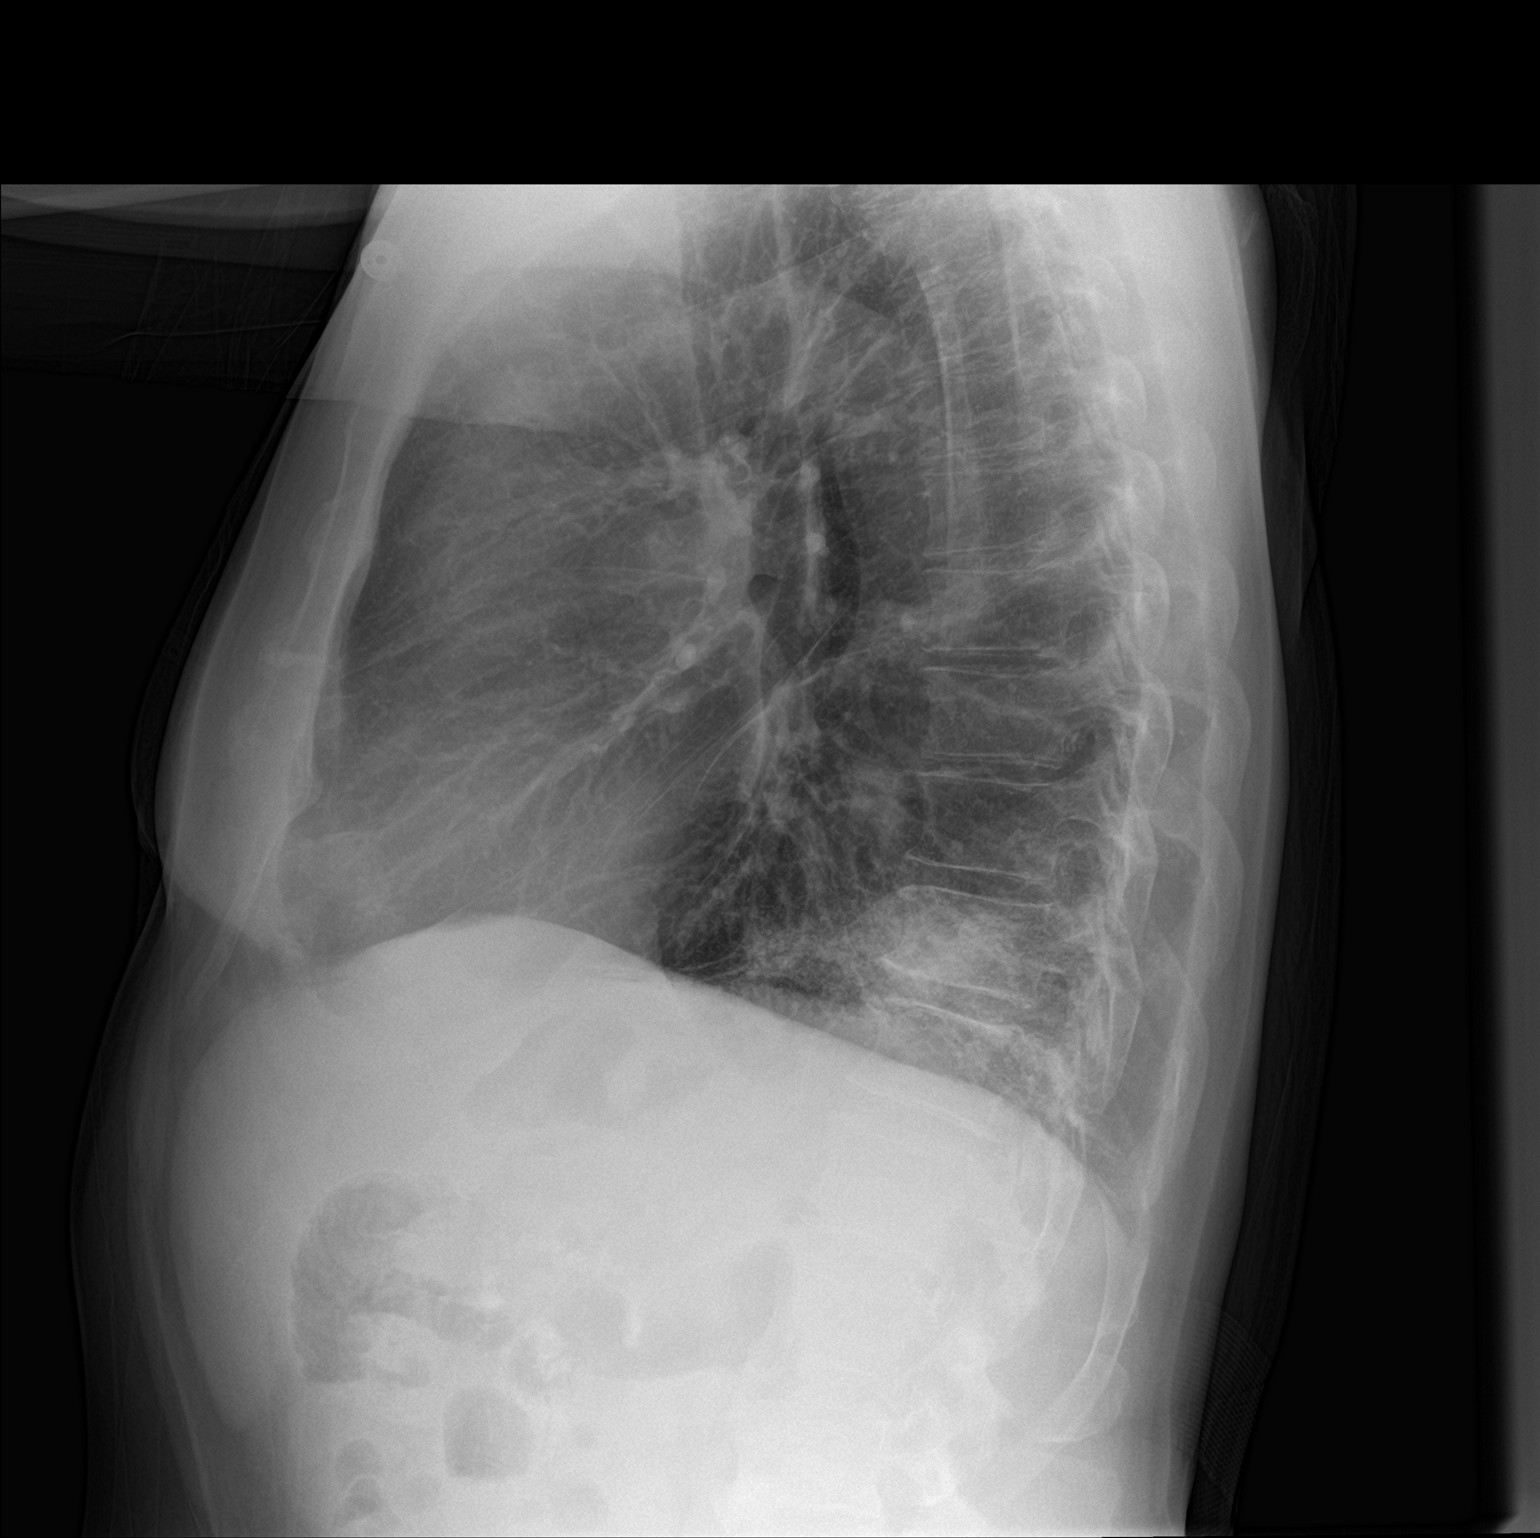

[2 of 2 positions shown; findings below may reference images not displayed]

FINDINGS: Cardiomediastinal silhouette is normal. Mediastinal contours appear
intact.

There is no evidence of pleural effusion or pneumothorax. There is a
patchy airspace consolidation in the left lower lobe.

Osseous structures are without acute abnormality. Soft tissues are
grossly normal.
IMPRESSION: Patchy airspace consolidation in the left lower lobe, likely
representing lobar pneumonia.

## 2015-08-17 MED ORDER — AZITHROMYCIN 250 MG PO TABS: ORAL_TABLET | ORAL | Status: DC

## 2015-08-17 MED ORDER — SODIUM CHLORIDE 0.9 % IV BOLUS (SEPSIS)
1000.0000 mL | Freq: Once | INTRAVENOUS | Status: AC
Start: 2015-08-17 — End: 2015-08-17
  Administered 2015-08-17: 1000 mL via INTRAVENOUS

## 2015-08-17 MED ORDER — DEXTROSE 5 % IV SOLN
1.0000 g | Freq: Once | INTRAVENOUS | Status: AC
  Administered 2015-08-17: 1 g via INTRAVENOUS
  Filled 2015-08-17: qty 10

## 2015-08-17 MED ORDER — OXYCODONE-ACETAMINOPHEN 5-325 MG PO TABS: 1.0000 | ORAL_TABLET | ORAL | Status: DC | PRN

## 2015-08-17 MED ORDER — ACETAMINOPHEN 325 MG PO TABS
ORAL_TABLET | ORAL | Status: AC
  Filled 2015-08-17: qty 2

## 2015-08-17 MED ORDER — ACETAMINOPHEN 325 MG PO TABS
650.0000 mg | ORAL_TABLET | Freq: Once | ORAL | Status: AC | PRN
  Administered 2015-08-17: 650 mg via ORAL

## 2015-08-17 MED ORDER — DEXTROSE 5 % IV SOLN
500.0000 mg | INTRAVENOUS | Status: DC
  Administered 2015-08-17: 500 mg via INTRAVENOUS
  Filled 2015-08-17: qty 500

## 2015-08-17 NOTE — ED Notes (Signed)
PT c/o cough, fever, chills and body aches x1 day. PT states he took DM tussin today for cough but nothing for fever.

## 2015-08-17 NOTE — ED Notes (Signed)
Patient given discharge instruction, verbalized understand. IV removed, band aid applied. Patient ambulatory out of the department.  

## 2015-08-17 NOTE — Discharge Instructions (Signed)
Community-Acquired Pneumonia, Adult Pneumonia is an infection of the lungs. One type of pneumonia can happen while a person is in a hospital. A different type can happen when a person is not in a hospital (community-acquired pneumonia). It is easy for this kind to spread from person to person. It can spread to you if you breathe near an infected person who coughs or sneezes. Some symptoms include:  A dry cough.  A wet (productive) cough.  Fever.  Sweating.  Chest pain. HOME CARE  Take over-the-counter and prescription medicines only as told by your doctor.  Only take cough medicine if you are losing sleep.  If you were prescribed an antibiotic medicine, take it as told by your doctor. Do not stop taking the antibiotic even if you start to feel better.  Sleep with your head and neck raised (elevated). You can do this by putting a few pillows under your head, or you can sleep in a recliner.  Do not use tobacco products. These include cigarettes, chewing tobacco, and e-cigarettes. If you need help quitting, ask your doctor.  Drink enough water to keep your pee (urine) clear or pale yellow. A shot (vaccine) can help prevent pneumonia. Shots are often suggested for:  People older than 68 years of age.  People older than 68 years of age:  Who are having cancer treatment.  Who have long-term (chronic) lung disease.  Who have problems with their body's defense system (immune system). You may also prevent pneumonia if you take these actions:  Get the flu (influenza) shot every year.  Go to the dentist as often as told.  Wash your hands often. If soap and water are not available, use hand sanitizer. GET HELP IF:  You have a fever.  You lose sleep because your cough medicine does not help. GET HELP RIGHT AWAY IF:  You are short of breath and it gets worse.  You have more chest pain.  Your sickness gets worse. This is very serious if:  You are an older adult.  Your  body's defense system is weak.  You cough up blood.   This information is not intended to replace advice given to you by your health care provider. Make sure you discuss any questions you have with your health care provider.   Document Released: 12/30/2007 Document Revised: 04/03/2015 Document Reviewed: 11/07/2014 Elsevier Interactive Patient Education 2016 [employer].   Start antibiotic tomorrow evening.  Prescription for pain medicine. Increase fluids. Check your sugars regularly

## 2015-08-17 NOTE — ED Provider Notes (Signed)
CSN: [id number]     Arrival date & time 08/17/15  1752 History  By signing my name below, I, Anthony [name], attest that this documentation has been prepared under the direction and in the presence of Nat Christen, MD. Electronically Signed: Rayna [name], ED Scribe. 08/17/2015. 7:02 PM.    Chief Complaint  Patient presents with  . Cough   The history is provided by the patient. No language interpreter was used.    HPI Comments: Anthony [name] is a 70 y.o. male who presents to the Emergency Department complaining of a worsening, moderate, cough with onset last night. Pt notes associated fever (102.9 F in triage), chills, body aches, mild, left sided, CP, nausea and 1x vomiting today. Pt notes a SHx for colon cancer on 03/29/2015 at [facility]. He confirms his hx of IDDM since 2005 noting he has an insulin pump in place. Pt confirms his listed PCP. He notes recently having high liver enzymes which he says were most likely due to ETOH use though he denies drinking regularly or excessively. He denies leg swelling or any other associated symptoms at this time.     Past Medical History  Diagnosis Date  . GERD (gastroesophageal reflux disease)   . Hyperlipidemia   . Hypertension   . Peripheral neuropathy ([place])   . Depression   . History of adenomatous polyp of colon   . Type 2 diabetes mellitus with insulin therapy ([place])     Pt reports he is Type 1 Diabetic-diagnosed at age 64  . Hemorrhoids   . Colon cancer [facility])     sigmoid cancer  s/p  left hemicolectomy (negative nodes and margins)   Past Surgical History  Procedure Laterality Date  . Nasal septum surgery  2006  . Laparoscopic partial colectomy N/A 03/29/2015    Procedure: LAPAROSCOPIC SIGMOIDECTOMY;  Surgeon: Leighton Ruff, MD;  Location: [facility];  Service: General;  Laterality: N/A;  . Colonoscopy  last one 03-14-2015  . Evaluation under anesthesia with hemorrhoidectomy N/A 05/29/2015    Procedure: ANAL EXAM UNDER ANESTHESIA WITH   HEMORRHOIDOPEXY; RIGID SIGMOIDOSCOPY;  Surgeon: Leighton Ruff, MD;  Location: [facility];  Service: General;  Laterality: N/A;   Family History  Problem Relation Age of Onset  . Hyperlipidemia Father   . Stroke Father   . Diabetes Maternal Uncle    Social History  Substance Use Topics  . Smoking status: Former Smoker -- 30 years    Quit date: 05/16/2009  . Smokeless tobacco: Never Used  . Alcohol Use: No    Review of Systems A complete 10 system review of systems was obtained and all systems are negative except as noted in the HPI and PMH.    Allergies  Other; Shellfish allergy; and Toradol  Home Medications   Prior to Admission medications   Medication Sig Start Date End Date Taking? Authorizing Provider  amLODipine (NORVASC) 10 MG tablet Take 10 mg by mouth every morning.   Yes Historical Provider, MD  aspirin 325 MG tablet Take 325-650 mg by mouth daily as needed for headache.    Yes Historical Provider, MD  carboxymethylcellulose (REFRESH PLUS) 0.5 % SOLN Apply 1 drop to eye daily as needed (FOR EYE IRRITATION).   Yes Historical Provider, MD  insulin aspart (NOVOLOG) 100 UNIT/ML injection Inject 0.25-4 Units into the skin 3 (three) times daily before meals. Plus after snacks--  Sliding Scale   Yes Historical Provider, MD  lisinopril (PRINIVIL,ZESTRIL) 40 MG tablet Take 40 mg by  mouth daily.   Yes Historical Provider, MD  Omega 3 1000 MG CAPS Take 1 capsule by mouth daily.   Yes Historical Provider, MD  pantoprazole (PROTONIX) 40 MG tablet Take 40 mg by mouth every morning.    Yes Historical Provider, MD  azithromycin (ZITHROMAX Z-PAK) 250 MG tablet Take as directed 08/17/15   Nat Christen, MD  HYDROcodone-acetaminophen (NORCO/VICODIN) 5-325 MG tablet Take 1 tablet by mouth every 4 (four) hours as needed. Patient not taking: Reported on [id number]   Leighton Ruff, MD  oxyCODONE-acetaminophen (PERCOCET) 5-325 MG tablet Take 1-2 tablets by mouth every 4  (four) hours as needed. 08/17/15   Nat Christen, MD   BP 121/74 mmHg  Pulse 94  Temp(Src) 100.6 F (38.1 C) (Oral)  Resp 18  Ht 6\' 2"  (1.88 m)  Wt 191 lb (86.637 kg)  BMI 24.51 kg/m2  SpO2 95%    Physical Exam  Constitutional: He is oriented to person, place, and time. He appears well-developed and well-nourished.  Pale and slightly dehydrated   HENT:  Head: Normocephalic and atraumatic.  Eyes: Conjunctivae and EOM are normal. Pupils are equal, round, and reactive to light.  Neck: Normal range of motion. Neck supple.  Cardiovascular: Normal rate and regular rhythm.   Pulmonary/Chest: Effort normal and breath sounds normal. No respiratory distress. He has no wheezes. He has no rales.  Abdominal: Soft. Bowel sounds are normal.  Musculoskeletal: Normal range of motion.  Neurological: He is alert and oriented to person, place, and time.  Skin: Skin is warm and dry.  Psychiatric: He has a normal mood and affect. His behavior is normal.  Nursing note and vitals reviewed.   ED Course  Procedures  DIAGNOSTIC STUDIES: Oxygen Saturation is 97% on RA, normal by my interpretation.    COORDINATION OF CARE: 6:57 PM Discussed treatment plan with pt at bedside including IVF, IV abx and reevaluation. Pt agreed to plan.  Labs Review Labs Reviewed  CBC - Abnormal; Notable for the following:    WBC 24.3 (*)    All other components within normal limits  BASIC METABOLIC PANEL - Abnormal; Notable for the following:    Potassium 3.1 (*)    CO2 20 (*)    Glucose, Bld 219 (*)    Creatinine, Ser 1.26 (*)    Calcium 8.8 (*)    GFR calc non Af Amer 57 (*)    All other components within normal limits    Imaging Review Dg Chest 2 View  08/17/2015  CLINICAL DATA:  Cough for 1 day.  Fever. EXAM: CHEST  2 VIEW COMPARISON:  03/22/2015 FINDINGS: Cardiomediastinal silhouette is normal. Mediastinal contours appear intact. There is no evidence of pleural effusion or pneumothorax. There is a patchy  airspace consolidation in the left lower lobe. Osseous structures are without acute abnormality. Soft tissues are grossly normal. IMPRESSION: Patchy airspace consolidation in the left lower lobe, likely representing lobar pneumonia. Electronically Signed   By: Fidela Salisbury M.D.   On: 08/17/2015 18:45   I have personally reviewed and evaluated these images and lab results as part of my medical decision-making.   EKG Interpretation None      MDM   Final diagnoses:  CAP (community acquired pneumonia)    Patient is hemodynamically stable. Blood pressure and pulse are normal. He is oxygenating well. Chest x-ray reveals a left lower lobe pneumonia.  IV fluids, IV Rocephin, IV Zithromax. Patient feels much better after treatment. Discharge medications Zithromax and percocet.  Test  results discussed with patient and his family.  I personally performed the services described in this documentation, which was scribed in my presence. The recorded information has been reviewed and is accurate.     Nat Christen, MD 08/17/15 2111

## 2015-09-11 DIAGNOSIS — R740 Nonspecific elevation of levels of transaminase and lactic acid dehydrogenase [LDH]: Secondary | ICD-10-CM | POA: Diagnosis not present

## 2015-10-01 DIAGNOSIS — E1165 Type 2 diabetes mellitus with hyperglycemia: Secondary | ICD-10-CM | POA: Diagnosis not present

## 2015-10-01 DIAGNOSIS — I10 Essential (primary) hypertension: Secondary | ICD-10-CM | POA: Diagnosis not present

## 2015-10-01 DIAGNOSIS — E7800 Pure hypercholesterolemia, unspecified: Secondary | ICD-10-CM | POA: Diagnosis not present

## 2015-10-08 DIAGNOSIS — E7800 Pure hypercholesterolemia, unspecified: Secondary | ICD-10-CM | POA: Diagnosis not present

## 2015-10-08 DIAGNOSIS — G609 Hereditary and idiopathic neuropathy, unspecified: Secondary | ICD-10-CM | POA: Diagnosis not present

## 2015-10-08 DIAGNOSIS — E1165 Type 2 diabetes mellitus with hyperglycemia: Secondary | ICD-10-CM | POA: Diagnosis not present

## 2015-10-08 DIAGNOSIS — I10 Essential (primary) hypertension: Secondary | ICD-10-CM | POA: Diagnosis not present

## 2015-10-31 DIAGNOSIS — E119 Type 2 diabetes mellitus without complications: Secondary | ICD-10-CM | POA: Diagnosis not present

## 2015-10-31 DIAGNOSIS — H25013 Cortical age-related cataract, bilateral: Secondary | ICD-10-CM | POA: Diagnosis not present

## 2015-10-31 DIAGNOSIS — H40033 Anatomical narrow angle, bilateral: Secondary | ICD-10-CM | POA: Diagnosis not present

## 2015-10-31 DIAGNOSIS — H40023 Open angle with borderline findings, high risk, bilateral: Secondary | ICD-10-CM | POA: Diagnosis not present

## 2015-10-31 DIAGNOSIS — H353130 Nonexudative age-related macular degeneration, bilateral, stage unspecified: Secondary | ICD-10-CM | POA: Diagnosis not present

## 2016-01-21 DIAGNOSIS — G609 Hereditary and idiopathic neuropathy, unspecified: Secondary | ICD-10-CM | POA: Diagnosis not present

## 2016-01-21 DIAGNOSIS — E7800 Pure hypercholesterolemia, unspecified: Secondary | ICD-10-CM | POA: Diagnosis not present

## 2016-01-21 DIAGNOSIS — I10 Essential (primary) hypertension: Secondary | ICD-10-CM | POA: Diagnosis not present

## 2016-01-21 DIAGNOSIS — E1165 Type 2 diabetes mellitus with hyperglycemia: Secondary | ICD-10-CM | POA: Diagnosis not present

## 2016-01-29 DIAGNOSIS — E1165 Type 2 diabetes mellitus with hyperglycemia: Secondary | ICD-10-CM | POA: Diagnosis not present

## 2016-03-09 DIAGNOSIS — R740 Nonspecific elevation of levels of transaminase and lactic acid dehydrogenase [LDH]: Secondary | ICD-10-CM | POA: Diagnosis not present

## 2016-04-10 DIAGNOSIS — C187 Malignant neoplasm of sigmoid colon: Secondary | ICD-10-CM | POA: Diagnosis not present

## 2016-04-27 DIAGNOSIS — I10 Essential (primary) hypertension: Secondary | ICD-10-CM | POA: Diagnosis not present

## 2016-04-27 DIAGNOSIS — E7800 Pure hypercholesterolemia, unspecified: Secondary | ICD-10-CM | POA: Diagnosis not present

## 2016-04-27 DIAGNOSIS — E1165 Type 2 diabetes mellitus with hyperglycemia: Secondary | ICD-10-CM | POA: Diagnosis not present

## 2016-04-27 DIAGNOSIS — Z23 Encounter for immunization: Secondary | ICD-10-CM | POA: Diagnosis not present

## 2016-04-27 DIAGNOSIS — G609 Hereditary and idiopathic neuropathy, unspecified: Secondary | ICD-10-CM | POA: Diagnosis not present

## 2016-04-30 DIAGNOSIS — Z85038 Personal history of other malignant neoplasm of large intestine: Secondary | ICD-10-CM | POA: Diagnosis not present

## 2016-04-30 DIAGNOSIS — K621 Rectal polyp: Secondary | ICD-10-CM | POA: Diagnosis not present

## 2016-05-12 DIAGNOSIS — H40023 Open angle with borderline findings, high risk, bilateral: Secondary | ICD-10-CM | POA: Diagnosis not present

## 2016-05-12 DIAGNOSIS — H40033 Anatomical narrow angle, bilateral: Secondary | ICD-10-CM | POA: Diagnosis not present

## 2016-05-12 DIAGNOSIS — H04123 Dry eye syndrome of bilateral lacrimal glands: Secondary | ICD-10-CM | POA: Diagnosis not present

## 2016-05-20 DIAGNOSIS — Z0000 Encounter for general adult medical examination without abnormal findings: Secondary | ICD-10-CM | POA: Diagnosis not present

## 2016-05-20 DIAGNOSIS — E1165 Type 2 diabetes mellitus with hyperglycemia: Secondary | ICD-10-CM | POA: Diagnosis not present

## 2016-05-20 DIAGNOSIS — E7800 Pure hypercholesterolemia, unspecified: Secondary | ICD-10-CM | POA: Diagnosis not present

## 2016-05-20 DIAGNOSIS — I10 Essential (primary) hypertension: Secondary | ICD-10-CM | POA: Diagnosis not present

## 2016-05-20 DIAGNOSIS — G609 Hereditary and idiopathic neuropathy, unspecified: Secondary | ICD-10-CM | POA: Diagnosis not present

## 2016-05-27 DIAGNOSIS — E7800 Pure hypercholesterolemia, unspecified: Secondary | ICD-10-CM | POA: Diagnosis not present

## 2016-05-27 DIAGNOSIS — G609 Hereditary and idiopathic neuropathy, unspecified: Secondary | ICD-10-CM | POA: Diagnosis not present

## 2016-05-27 DIAGNOSIS — E1165 Type 2 diabetes mellitus with hyperglycemia: Secondary | ICD-10-CM | POA: Diagnosis not present

## 2016-05-27 DIAGNOSIS — K760 Fatty (change of) liver, not elsewhere classified: Secondary | ICD-10-CM | POA: Diagnosis not present

## 2016-08-03 DIAGNOSIS — E1165 Type 2 diabetes mellitus with hyperglycemia: Secondary | ICD-10-CM | POA: Diagnosis not present

## 2016-08-03 DIAGNOSIS — G609 Hereditary and idiopathic neuropathy, unspecified: Secondary | ICD-10-CM | POA: Diagnosis not present

## 2016-08-03 DIAGNOSIS — I10 Essential (primary) hypertension: Secondary | ICD-10-CM | POA: Diagnosis not present

## 2016-08-03 DIAGNOSIS — E7800 Pure hypercholesterolemia, unspecified: Secondary | ICD-10-CM | POA: Diagnosis not present

## 2016-11-02 DIAGNOSIS — E119 Type 2 diabetes mellitus without complications: Secondary | ICD-10-CM | POA: Diagnosis not present

## 2016-11-02 DIAGNOSIS — H40023 Open angle with borderline findings, high risk, bilateral: Secondary | ICD-10-CM | POA: Diagnosis not present

## 2016-11-02 DIAGNOSIS — H353130 Nonexudative age-related macular degeneration, bilateral, stage unspecified: Secondary | ICD-10-CM | POA: Diagnosis not present

## 2016-11-02 DIAGNOSIS — H40033 Anatomical narrow angle, bilateral: Secondary | ICD-10-CM | POA: Diagnosis not present

## 2016-12-01 DIAGNOSIS — G609 Hereditary and idiopathic neuropathy, unspecified: Secondary | ICD-10-CM | POA: Diagnosis not present

## 2016-12-01 DIAGNOSIS — E7800 Pure hypercholesterolemia, unspecified: Secondary | ICD-10-CM | POA: Diagnosis not present

## 2016-12-08 DIAGNOSIS — G609 Hereditary and idiopathic neuropathy, unspecified: Secondary | ICD-10-CM | POA: Diagnosis not present

## 2016-12-08 DIAGNOSIS — E1165 Type 2 diabetes mellitus with hyperglycemia: Secondary | ICD-10-CM | POA: Diagnosis not present

## 2016-12-08 DIAGNOSIS — E782 Mixed hyperlipidemia: Secondary | ICD-10-CM | POA: Diagnosis not present

## 2017-01-19 DIAGNOSIS — G609 Hereditary and idiopathic neuropathy, unspecified: Secondary | ICD-10-CM | POA: Diagnosis not present

## 2017-01-19 DIAGNOSIS — I10 Essential (primary) hypertension: Secondary | ICD-10-CM | POA: Diagnosis not present

## 2017-01-19 DIAGNOSIS — E1165 Type 2 diabetes mellitus with hyperglycemia: Secondary | ICD-10-CM | POA: Diagnosis not present

## 2017-01-19 DIAGNOSIS — E7800 Pure hypercholesterolemia, unspecified: Secondary | ICD-10-CM | POA: Diagnosis not present

## 2017-05-03 DIAGNOSIS — H40023 Open angle with borderline findings, high risk, bilateral: Secondary | ICD-10-CM | POA: Diagnosis not present

## 2017-05-03 DIAGNOSIS — H40033 Anatomical narrow angle, bilateral: Secondary | ICD-10-CM | POA: Diagnosis not present

## 2017-06-15 DIAGNOSIS — E1165 Type 2 diabetes mellitus with hyperglycemia: Secondary | ICD-10-CM | POA: Diagnosis not present

## 2017-06-15 DIAGNOSIS — Z0000 Encounter for general adult medical examination without abnormal findings: Secondary | ICD-10-CM | POA: Diagnosis not present

## 2017-06-15 DIAGNOSIS — I10 Essential (primary) hypertension: Secondary | ICD-10-CM | POA: Diagnosis not present

## 2017-06-15 DIAGNOSIS — Z23 Encounter for immunization: Secondary | ICD-10-CM | POA: Diagnosis not present

## 2017-06-15 DIAGNOSIS — E782 Mixed hyperlipidemia: Secondary | ICD-10-CM | POA: Diagnosis not present

## 2017-06-22 DIAGNOSIS — E7800 Pure hypercholesterolemia, unspecified: Secondary | ICD-10-CM | POA: Diagnosis not present

## 2017-06-22 DIAGNOSIS — R972 Elevated prostate specific antigen [PSA]: Secondary | ICD-10-CM | POA: Diagnosis not present

## 2017-06-22 DIAGNOSIS — R197 Diarrhea, unspecified: Secondary | ICD-10-CM | POA: Diagnosis not present

## 2017-06-22 DIAGNOSIS — G609 Hereditary and idiopathic neuropathy, unspecified: Secondary | ICD-10-CM | POA: Diagnosis not present

## 2017-06-22 DIAGNOSIS — E1165 Type 2 diabetes mellitus with hyperglycemia: Secondary | ICD-10-CM | POA: Diagnosis not present

## 2017-06-22 DIAGNOSIS — Z85038 Personal history of other malignant neoplasm of large intestine: Secondary | ICD-10-CM | POA: Diagnosis not present

## 2017-06-22 DIAGNOSIS — I10 Essential (primary) hypertension: Secondary | ICD-10-CM | POA: Diagnosis not present

## 2017-06-22 DIAGNOSIS — K760 Fatty (change of) liver, not elsewhere classified: Secondary | ICD-10-CM | POA: Diagnosis not present

## 2017-06-24 DIAGNOSIS — R197 Diarrhea, unspecified: Secondary | ICD-10-CM | POA: Diagnosis not present

## 2017-07-01 DIAGNOSIS — R972 Elevated prostate specific antigen [PSA]: Secondary | ICD-10-CM | POA: Diagnosis not present

## 2017-07-01 DIAGNOSIS — R3915 Urgency of urination: Secondary | ICD-10-CM | POA: Diagnosis not present

## 2017-07-28 DIAGNOSIS — Z9641 Presence of insulin pump (external) (internal): Secondary | ICD-10-CM | POA: Diagnosis not present

## 2017-07-28 DIAGNOSIS — E7800 Pure hypercholesterolemia, unspecified: Secondary | ICD-10-CM | POA: Diagnosis not present

## 2017-07-28 DIAGNOSIS — G609 Hereditary and idiopathic neuropathy, unspecified: Secondary | ICD-10-CM | POA: Diagnosis not present

## 2017-07-28 DIAGNOSIS — E1165 Type 2 diabetes mellitus with hyperglycemia: Secondary | ICD-10-CM | POA: Diagnosis not present

## 2017-07-28 DIAGNOSIS — I10 Essential (primary) hypertension: Secondary | ICD-10-CM | POA: Diagnosis not present

## 2017-08-02 DIAGNOSIS — R972 Elevated prostate specific antigen [PSA]: Secondary | ICD-10-CM | POA: Diagnosis not present

## 2017-08-02 DIAGNOSIS — C61 Malignant neoplasm of prostate: Secondary | ICD-10-CM | POA: Diagnosis not present

## 2017-08-10 ENCOUNTER — Encounter: Payer: Self-pay | Admitting: Radiation Oncology

## 2017-08-10 DIAGNOSIS — R3915 Urgency of urination: Secondary | ICD-10-CM | POA: Diagnosis not present

## 2017-08-10 DIAGNOSIS — C61 Malignant neoplasm of prostate: Secondary | ICD-10-CM | POA: Diagnosis not present

## 2017-08-24 ENCOUNTER — Encounter: Payer: Self-pay | Admitting: Radiation Oncology

## 2017-08-24 NOTE — Progress Notes (Signed)
GU Location of Tumor / Histology: prostatic adenocarcinoma  If Prostate Cancer, Gleason Score is (3 + 4) and PSA is (5.07). Prostate volume: 38cc  Anthony [name] has a history of colon cancer. Patient's father with non lethal prostate cancer. Patient's rising PSA noted by PCP, Dr. Merrilee Seashore November 2018 thus referral was made to Dr. Tresa Moore.   2015   PSA  1.3 2017  PSA  2.07  Biopsies of prostate (if applicable) revealed:    Past/Anticipated interventions by urology, if any: prostate biopsy, discussion about surgery vs brachytherapy, referral to Dr. Tammi Klippel to discuss brachytherapy.    Past/Anticipated interventions by medical oncology, if any: no  Weight changes, if any: no  Bowel/Bladder complaints, if any: IPSS 28 with urinary frequency, nocturia, leakage,urgency, hesitancy and ED. Denies dysuria, leakage or incontinence. Reports dark red blood in semen. Denies hematuria.  Nausea/Vomiting, if any: no  Pain issues, if any: intermittent throbbing rectal pain after colon surgery  SAFETY ISSUES:  Prior radiation? no  Pacemaker/ICD? no  Possible current pregnancy? no  Is the patient on methotrexate? no  Current Complaints / other details:  72 year old male. Married with two daughters. Preferred name: [name]. Believes in was exposed to agent orange in the [place] war. Resides in [place].

## 2017-08-24 NOTE — Progress Notes (Signed)
Radiation Oncology         [phone]) [phone] ________________________________  Initial Outpatient Consultation  Name: Anthony [name] MRN: [id number]  Date: 08/25/2017  DOB: Sep 11, 1945  WN:[id number], Mauro Kaufmann, MD  Alexis Frock, MD   REFERRING PHYSICIAN: Alexis Frock, MD  DIAGNOSIS: 72 y.o. gentleman with Stage T1c adenocarcinoma of the prostate with Gleason Score of 3+4, and PSA of 5.07    ICD-10-CM   1. Malignant neoplasm of prostate ([place]) [place] Ambulatory referral to Social Work  2. Increased bowel frequency R19.4 Ambulatory referral to Gastroenterology    HISTORY OF PRESENT ILLNESS: Anthony [name] is a 72 y.o. male with a diagnosis of prostate cancer. He was noted to have an elevated PSA of 5.07 in November 2018 by his primary care physician, Dr. Ashby Dawes, that had been slowly rising over the past 4 years.  Accordingly, he was referred for evaluation in urology to Dr. Tresa Moore on 07/01/2017, where a digital rectal examination was performed at that time revealing a smooth, symmetrical prostate with no discrete nodularity.  The patient proceeded to transrectal ultrasound with 12 biopsies of the prostate on 08/02/2017.  The prostate volume measured 38 cc.  Out of 12 core biopsies, 1 was positive.  The maximum Gleason score was 3+4, and this was seen in the right mid lateral.  Biopsies of prostate revealed:   The patient reviewed the biopsy results with his urologist and he has kindly been referred today for discussion of potential radiation treatment options. He is accompanied by his wife.  Of note, the patient has a history of colon cancer s/p sigmoidectomy in September 2016 with negative margins and lymph nodes. He denies having had any chemotherapy or radiation for his colon cancer.  Since his surgery, he has persistent bowel urgency up to 25x/day with only small volume stools.  He has occasional throbbing pain in the rectum with BMs.   PREVIOUS RADIATION THERAPY: No  PAST MEDICAL HISTORY:    Past Medical History:  Diagnosis Date  . Colon cancer [facility])    sigmoid cancer  s/p  left hemicolectomy (negative nodes and margins)  . Depression   . GERD (gastroesophageal reflux disease)   . Hemorrhoids   . History of adenomatous polyp of colon   . Hyperlipidemia   . Hypertension   . Peripheral neuropathy   . Prostate cancer ([place])   . Type 2 diabetes mellitus with insulin therapy ([place])    Pt reports he is Type 1 Diabetic-diagnosed at age 45      PAST SURGICAL HISTORY: Past Surgical History:  Procedure Laterality Date  . COLONOSCOPY  last one 03-14-2015  . EVALUATION UNDER ANESTHESIA WITH HEMORRHOIDECTOMY N/A 05/29/2015   Procedure: ANAL EXAM UNDER ANESTHESIA WITH  HEMORRHOIDOPEXY; RIGID SIGMOIDOSCOPY;  Surgeon: Leighton Ruff, MD;  Location: [place];  Service: General;  Laterality: N/A;  . LAPAROSCOPIC PARTIAL COLECTOMY N/A 03/29/2015   Procedure: LAPAROSCOPIC SIGMOIDECTOMY;  Surgeon: Leighton Ruff, MD;  Location: [facility];  Service: General;  Laterality: N/A;  . NASAL SEPTUM SURGERY  2006  . PROSTATE BIOPSY      FAMILY HISTORY:  Family History  Problem Relation Age of Onset  . Hyperlipidemia Father   . Stroke Father   . Prostate cancer Father   . Diabetes Maternal Uncle   . Cancer Brother        unknown blood cancer    SOCIAL HISTORY:  Social History   Socioeconomic History  . Marital status: Married    Spouse name: Not on file  .  Number of children: 2  . Years of education: Not on file  . Highest education level: Not on file  Social Needs  . Financial resource strain: Not on file  . Food insecurity - worry: Not on file  . Food insecurity - inability: Not on file  . Transportation needs - medical: Not on file  . Transportation needs - non-medical: Not on file  Occupational History  . Occupation: Retired  Tobacco Use  . Smoking status: Former Smoker    Years: 30.00    Types: Cigarettes    Last attempt to quit: 06/26/2006    Years since  quitting: 11.1  . Smokeless tobacco: Never Used  Substance and Sexual Activity  . Alcohol use: No  . Drug use: No  . Sexual activity: Not Currently  Other Topics Concern  . Not on file  Social History Narrative   Resides in [place], [place]. Married with two daughters. Believes he was exposed to agent orange in the [place] War.     ALLERGIES: Other; Shellfish allergy; and Toradol [ketorolac tromethamine]  MEDICATIONS:  Current Outpatient Medications  Medication Sig Dispense Refill  . amLODipine (NORVASC) 10 MG tablet Take 10 mg by mouth every morning.    Marland Kitchen aspirin 325 MG tablet Take 325-650 mg by mouth daily as needed for headache.     . hydrochlorothiazide (MICROZIDE) 12.5 MG capsule Take 12.5 mg by mouth daily.    . insulin aspart (NOVOLOG) 100 UNIT/ML injection Inject 0.25-4 Units into the skin 3 (three) times daily before meals. Plus after snacks--  Sliding Scale    . lisinopril (PRINIVIL,ZESTRIL) 40 MG tablet Take 40 mg by mouth daily.    . pantoprazole (PROTONIX) 40 MG tablet Take 40 mg by mouth every morning.     . carboxymethylcellulose (REFRESH PLUS) 0.5 % SOLN Apply 1 drop to eye daily as needed (FOR EYE IRRITATION).    Marland Kitchen HYDROcodone-acetaminophen (NORCO/VICODIN) 5-325 MG tablet Take 1 tablet by mouth every 4 (four) hours as needed. (Patient not taking: Reported on 08/17/2015) 30 tablet 0  . Omega 3 1000 MG CAPS Take 1 capsule by mouth daily.    Marland Kitchen oxyCODONE-acetaminophen (PERCOCET) 5-325 MG tablet Take 1-2 tablets by mouth every 4 (four) hours as needed. (Patient not taking: Reported on 08/25/2017) 15 tablet 0  . tamsulosin (FLOMAX) 0.4 MG CAPS capsule Take 1 capsule (0.4 mg total) by mouth daily after supper. 30 capsule 5   No current facility-administered medications for this encounter.     REVIEW OF SYSTEMS:  On review of systems, the patient reports that he is doing well overall. He denies any chest pain, shortness of breath, cough, fevers, chills, night sweats, or unintended  weight changes. He reports frequent bowel movements with several (up to 25) small soft stools per day. He denies abdominal pain, nausea or vomiting. He reports intermittent throbbing rectal pain over the past 2 years after his colon surgery. His IPSS was 28, indicating severe urinary symptoms of intermittency, nocturia x5, incomplete emptying, urgency, and weak stream. He denies dysuria or hematuria. He has moderate erectile dysfunction and reports hematospermia with dark red blood. A complete review of systems is obtained and is otherwise negative.    PHYSICAL EXAM:  Wt Readings from Last 3 Encounters:  08/25/17 211 lb 12.8 oz (96.1 kg)  08/17/15 191 lb (86.6 kg)  05/29/15 186 lb (84.4 kg)   Temp Readings from Last 3 Encounters:  08/17/15 100.6 F (38.1 C) (Oral)  05/29/15 97.6 F (36.4 C) (Axillary)  04/02/15 98 F (36.7 C) (Oral)   BP Readings from Last 3 Encounters:  08/25/17 (!) 155/98  08/17/15 121/74  05/29/15 (!) 145/89   Pulse Readings from Last 3 Encounters:  08/25/17 78  08/17/15 94  05/29/15 77   Pain Assessment Pain Score: 0-No pain/10  In general this is a well appearing African-American man in no acute distress. He is alert and oriented x4 and appropriate throughout the examination. HEENT reveals that the patient is normocephalic, atraumatic. Skin is intact without any evidence of gross lesions. Cardiovascular exam reveals a regular rate and rhythm, no clicks rubs or murmurs are auscultated. Chest is clear to auscultation bilaterally. Lymphatic assessment is performed and does not reveal any adenopathy in the cervical, supraclavicular, axillary, or inguinal chains. Abdomen has active bowel sounds in all quadrants and is intact. The abdomen is soft, non tender, non distended. Lower extremities are negative for pretibial pitting edema, deep calf tenderness, cyanosis or clubbing.   KPS = 90  100 - Normal; no complaints; no evidence of disease. 90   - Able to carry on  normal activity; minor signs or symptoms of disease. 80   - Normal activity with effort; some signs or symptoms of disease. 70   - Cares for self; unable to carry on normal activity or to do active work. 60   - Requires occasional assistance, but is able to care for most of his personal needs. 50   - Requires considerable assistance and frequent medical care. 72   - Disabled; requires special care and assistance. 2   - Severely disabled; hospital admission is indicated although death not imminent. 11   - Very sick; hospital admission necessary; active supportive treatment necessary. 10   - Moribund; fatal processes progressing rapidly. 0     - Dead  Karnofsky DA, Abelmann Ramos, Craver LS and Burchenal [facility] [phone]) The use of the nitrogen mustards in the palliative treatment of carcinoma: with particular reference to bronchogenic carcinoma Cancer 1 [phone]  LABORATORY DATA:  Lab Results  Component Value Date   WBC 24.3 (H) 08/17/2015   HGB 15.1 08/17/2015   HCT 43.5 08/17/2015   MCV 86.3 08/17/2015   PLT 231 08/17/2015   Lab Results  Component Value Date   NA 137 08/17/2015   K 3.1 (L) 08/17/2015   CL 102 08/17/2015   CO2 20 (L) 08/17/2015   No results found for: ALT, AST, GGT, ALKPHOS, BILITOT   RADIOGRAPHY: No results found.    IMPRESSION/PLAN: 1. 72 y.o. gentleman with Stage T1c adenocarcinoma of the prostate with Gleason Score of 3+4, and PSA of 5.07.  We discussed the patient's workup and outlined the nature of prostate cancer in this setting. The patient's T stage, Gleason's score, and PSA put him into the favorable intermediate risk group. Accordingly, he is eligible for a variety of potential treatment options including brachytherapy, 8 weeks of external radiation, or prostatectomy. We discussed the available radiation techniques, and focused on the details and logistics and delivery. We discussed and outlined the risks, benefits, short and long-term effects associated with  radiotherapy and compared and contrasted these with prostatectomy. He is adamantly not interested in prostatectomy.  We also discussed the role of SpaceOAR in reducing the rectal toxicity associated with radiotherapy.   At the end of the conversation, the patient is interested in prostate brachytherapy. We discussed that he would not be an ideal candidate for brachytherapy at this time due to the severity of his LUTS. However, we  will start him on a trial of Flomax 0.4mg  daily to see if we can get his LUTS under better control prior to this procedure. We will follow up with the patient by phone in the next 2-3 weeks to evaluate his symptom management on Flomax.  If he has significant improvement in his LUTS, we will move forward with coordinating brachytherapy with SpaceOAR.  If he has not gotten significant relief with the Flomax, we will refer Bud Face for prostate IMRT closer to his home in [place].  2. Referral back to Dr. Amedeo Plenty in GI for further evaluation of bowel urgency/frequency. This is significantly affecting his QOL and impairing his ability to remain active.  We spent more than 50% of today's time face to face with the patient in counseling and/or coordination of care.    Nicholos Johns, PA-C    Tyler Pita, MD  [facility] Direct Dial: [phone]  Fax: [phone] [email or web address]  Skype  [email or web address]  This document serves as a record of services personally performed by Tyler Pita, MD and Freeman Caldron, PA-C. It was created on their behalf by Rae Lips, a trained medical scribe. The creation of this record is based on the scribe's personal observations and the providers' statements to them. This document has been checked and approved by the attending providers.

## 2017-08-25 ENCOUNTER — Encounter: Payer: Self-pay | Admitting: Radiation Oncology

## 2017-08-25 ENCOUNTER — Ambulatory Visit
Admission: RE | Admit: 2017-08-25 | Discharge: 2017-08-25 | Disposition: A | Discharge status: Home / Self Care | Payer: Medicare Other | Source: Ambulatory Visit | Attending: Radiation Oncology | Admitting: Radiation Oncology

## 2017-08-25 ENCOUNTER — Other Ambulatory Visit: Payer: Self-pay

## 2017-08-25 VITALS — BP 155/98 | HR 78 | Resp 18 | Ht 74.0 in | Wt 211.8 lb

## 2017-08-25 DIAGNOSIS — Z7982 Long term (current) use of aspirin: Secondary | ICD-10-CM | POA: Insufficient documentation

## 2017-08-25 DIAGNOSIS — C61 Malignant neoplasm of prostate: Secondary | ICD-10-CM | POA: Insufficient documentation

## 2017-08-25 DIAGNOSIS — E785 Hyperlipidemia, unspecified: Secondary | ICD-10-CM | POA: Insufficient documentation

## 2017-08-25 DIAGNOSIS — Z87891 Personal history of nicotine dependence: Secondary | ICD-10-CM | POA: Insufficient documentation

## 2017-08-25 DIAGNOSIS — Z79899 Other long term (current) drug therapy: Secondary | ICD-10-CM | POA: Insufficient documentation

## 2017-08-25 DIAGNOSIS — K219 Gastro-esophageal reflux disease without esophagitis: Secondary | ICD-10-CM | POA: Insufficient documentation

## 2017-08-25 DIAGNOSIS — F329 Major depressive disorder, single episode, unspecified: Secondary | ICD-10-CM | POA: Insufficient documentation

## 2017-08-25 DIAGNOSIS — Z794 Long term (current) use of insulin: Secondary | ICD-10-CM | POA: Insufficient documentation

## 2017-08-25 DIAGNOSIS — N401 Enlarged prostate with lower urinary tract symptoms: Secondary | ICD-10-CM | POA: Diagnosis not present

## 2017-08-25 DIAGNOSIS — I10 Essential (primary) hypertension: Secondary | ICD-10-CM | POA: Diagnosis not present

## 2017-08-25 DIAGNOSIS — R972 Elevated prostate specific antigen [PSA]: Secondary | ICD-10-CM | POA: Diagnosis not present

## 2017-08-25 DIAGNOSIS — Z85038 Personal history of other malignant neoplasm of large intestine: Secondary | ICD-10-CM | POA: Diagnosis not present

## 2017-08-25 DIAGNOSIS — E1140 Type 2 diabetes mellitus with diabetic neuropathy, unspecified: Secondary | ICD-10-CM | POA: Insufficient documentation

## 2017-08-25 DIAGNOSIS — R194 Change in bowel habit: Secondary | ICD-10-CM | POA: Insufficient documentation

## 2017-08-25 DIAGNOSIS — Z8042 Family history of malignant neoplasm of prostate: Secondary | ICD-10-CM | POA: Diagnosis not present

## 2017-08-25 DIAGNOSIS — R152 Fecal urgency: Secondary | ICD-10-CM | POA: Diagnosis not present

## 2017-08-25 HISTORY — DX: Malignant neoplasm of prostate: C61

## 2017-08-25 MED ORDER — TAMSULOSIN HCL 0.4 MG PO CAPS: 0.4000 mg | ORAL_CAPSULE | Freq: Every day | ORAL | 5 refills | Status: DC

## 2017-08-25 NOTE — Progress Notes (Signed)
See progress note under physician encounter. 

## 2017-08-26 ENCOUNTER — Telehealth: Payer: Self-pay | Admitting: *Deleted

## 2017-08-26 ENCOUNTER — Telehealth: Payer: Self-pay | Admitting: Medical Oncology

## 2017-08-26 ENCOUNTER — Telehealth: Payer: Self-pay | Admitting: General Practice

## 2017-08-26 NOTE — Telephone Encounter (Signed)
Called patient to inform of appt. with Dr. Alessandra Bevels on 08-31-17- arrival time - 10:15 am, address- [address], [place], ph. [phone], spoke with patient and he is aware of this appt.

## 2017-08-26 NOTE — Telephone Encounter (Signed)
xxxxx 

## 2017-08-26 NOTE — Telephone Encounter (Signed)
[place] Psychosocial Distress Screening Clinical Social Work  Clinical Social Work was referred by distress screening protocol.  The patient scored a 5 on the Psychosocial Distress Thermometer which indicates moderate distress. Clinical Social Worker Edwyna Shell to assess for distress and other psychosocial needs. CSW and patient discussed common feeling and emotions when being diagnosed with cancer, and the importance of support during treatment. CSW informed patient of the support team and support services at [facility]. CSW provided contact information and encouraged patient to call with any questions or concerns.  Per patient, he has no transportation issues and lives w wife, children and grandchildren who are very supportive.  No concerns at this time.    ONCBCN DISTRESS SCREENING 08/25/2017  Screening Type Initial Screening  Distress experienced in past week (1-10) 5  Emotional problem type Depression;Nervousness/Anxiety;Adjusting to illness  Information Concerns Type Lack of info about treatment  Physical Problem type Sleep/insomnia;Constipation/diarrhea;Changes in urination;Tingling hands/feet;Sexual problems;Skin dry/itchy  Physician notified of physical symptoms Yes  Referral to clinical psychology No  Referral to clinical social work Yes  Referral to dietition No  Referral to financial advocate No  Referral to support programs No  Referral to palliative care No    Clinical Social Worker follow up needed: No.  If yes, follow up plan:  Edwyna Shell, LCSW Clinical Social Worker Phone:  [phone]

## 2017-08-26 NOTE — Telephone Encounter (Signed)
Called Anthony [name] to introduce myself and discuss my role as the prostate nurse navigator. I was unable to meet him yesterday when he consulted with Dr. Tammi Klippel. He states the consult went well and hopes to do brachytherapy. He was prescribed Flomax to help improve his urinary symptoms. Ashlyn will follow up with him in a couple of weeks.  I asked him to call me with questions or concerns. He voiced understanding.

## 2017-08-31 DIAGNOSIS — K861 Other chronic pancreatitis: Secondary | ICD-10-CM | POA: Diagnosis not present

## 2017-08-31 DIAGNOSIS — R740 Nonspecific elevation of levels of transaminase and lactic acid dehydrogenase [LDH]: Secondary | ICD-10-CM | POA: Diagnosis not present

## 2017-08-31 DIAGNOSIS — Z9049 Acquired absence of other specified parts of digestive tract: Secondary | ICD-10-CM | POA: Diagnosis not present

## 2017-08-31 DIAGNOSIS — K529 Noninfective gastroenteritis and colitis, unspecified: Secondary | ICD-10-CM | POA: Diagnosis not present

## 2017-08-31 DIAGNOSIS — R197 Diarrhea, unspecified: Secondary | ICD-10-CM | POA: Diagnosis not present

## 2017-08-31 DIAGNOSIS — C187 Malignant neoplasm of sigmoid colon: Secondary | ICD-10-CM | POA: Diagnosis not present

## 2017-09-01 DIAGNOSIS — K529 Noninfective gastroenteritis and colitis, unspecified: Secondary | ICD-10-CM | POA: Diagnosis not present

## 2017-09-01 DIAGNOSIS — R197 Diarrhea, unspecified: Secondary | ICD-10-CM | POA: Diagnosis not present

## 2017-09-14 ENCOUNTER — Telehealth: Payer: Self-pay | Admitting: Urology

## 2017-09-14 DIAGNOSIS — R3915 Urgency of urination: Secondary | ICD-10-CM | POA: Diagnosis not present

## 2017-09-14 DIAGNOSIS — C61 Malignant neoplasm of prostate: Secondary | ICD-10-CM | POA: Diagnosis not present

## 2017-09-14 NOTE — Telephone Encounter (Signed)
LMOVM requesting a return call to assess response to recently prescribed Flomax.  If significant improvement in LUTS on Flomax, will proceed with scheduling brachytherapy.  If no significant improvement, will refer for prostate IMRT in [facility] with Dr. Raliegh Ip since this is much closer to his home.   Nicholos Johns, PA-C

## 2017-09-15 ENCOUNTER — Telehealth: Payer: Self-pay | Admitting: Urology

## 2017-09-15 NOTE — Telephone Encounter (Signed)
I spoke with the patient over the phone regarding symptom relief since starting Flomax.  He reports that he has had significant improvement in his flow of stream and ability to empty his bladder on voiding.  He also reports decreased frequency and urgency and no longer feeling need to strain with voiding.  He met with Dr. Tresa Moore on 09/14/2017 and Dr. Tresa Moore agreed that he would be a good candidate for brachytherapy with the addition of Flomax.  We will move forward with scheduling brachytherapy for the near future.   Nicholos Johns, PA-C

## 2017-09-20 ENCOUNTER — Telehealth: Payer: Self-pay | Admitting: *Deleted

## 2017-09-20 NOTE — Telephone Encounter (Signed)
CALLED PATIENT TO ASK QUESTION, SPOKE WITH PATIENT 

## 2017-09-21 ENCOUNTER — Other Ambulatory Visit: Payer: Self-pay | Admitting: Urology

## 2017-09-21 ENCOUNTER — Telehealth: Payer: Self-pay | Admitting: *Deleted

## 2017-09-21 NOTE — Telephone Encounter (Signed)
CALLED PATIENT TO INFORM OF PRE-SEED PLANNING CT , LAB AND IMPLANT, SPOKE WITH PATIENT AND HE IS AWARE OF THESE APPTS.

## 2017-09-29 DIAGNOSIS — K529 Noninfective gastroenteritis and colitis, unspecified: Secondary | ICD-10-CM | POA: Diagnosis not present

## 2017-09-29 DIAGNOSIS — Z9049 Acquired absence of other specified parts of digestive tract: Secondary | ICD-10-CM | POA: Diagnosis not present

## 2017-09-29 DIAGNOSIS — K861 Other chronic pancreatitis: Secondary | ICD-10-CM | POA: Diagnosis not present

## 2017-09-29 DIAGNOSIS — C187 Malignant neoplasm of sigmoid colon: Secondary | ICD-10-CM | POA: Diagnosis not present

## 2017-10-05 ENCOUNTER — Telehealth: Payer: Self-pay | Admitting: *Deleted

## 2017-10-05 DIAGNOSIS — R197 Diarrhea, unspecified: Secondary | ICD-10-CM | POA: Diagnosis not present

## 2017-10-05 DIAGNOSIS — E7800 Pure hypercholesterolemia, unspecified: Secondary | ICD-10-CM | POA: Diagnosis not present

## 2017-10-05 NOTE — Telephone Encounter (Signed)
CALLED PATIENT TO REMIND OF PRE-SEED APPTS. FOR 10-06-17, SPOKE WITH PATIENT AND HE IS AWARE OF THESE APPTS

## 2017-10-06 ENCOUNTER — Ambulatory Visit
Admission: RE | Admit: 2017-10-06 | Discharge: 2017-10-06 | Disposition: A | Discharge status: Home / Self Care | Payer: Medicare Other | Source: Ambulatory Visit | Attending: Radiation Oncology | Admitting: Radiation Oncology

## 2017-10-06 ENCOUNTER — Ambulatory Visit (HOSPITAL_COMMUNITY)
Admission: RE | Admit: 2017-10-06 | Discharge: 2017-10-06 | Disposition: A | Discharge status: Home / Self Care | Payer: Medicare Other | Source: Ambulatory Visit | Attending: Urology | Admitting: Urology

## 2017-10-06 ENCOUNTER — Encounter (HOSPITAL_COMMUNITY)
Admission: RE | Admit: 2017-10-06 | Discharge: 2017-10-06 | Disposition: A | Discharge status: Home / Self Care | Payer: Medicare Other | Source: Ambulatory Visit | Attending: Urology | Admitting: Urology

## 2017-10-06 DIAGNOSIS — R9431 Abnormal electrocardiogram [ECG] [EKG]: Secondary | ICD-10-CM | POA: Diagnosis not present

## 2017-10-06 DIAGNOSIS — C61 Malignant neoplasm of prostate: Secondary | ICD-10-CM | POA: Diagnosis not present

## 2017-10-06 DIAGNOSIS — Z01818 Encounter for other preprocedural examination: Secondary | ICD-10-CM | POA: Insufficient documentation

## 2017-10-06 DIAGNOSIS — Z01810 Encounter for preprocedural cardiovascular examination: Secondary | ICD-10-CM | POA: Diagnosis not present

## 2017-10-06 IMAGING — DX DG CHEST 2V
2 series · 2 of 2 positions shown · non-contrast
Comparison: [DATE]

CLINICAL DATA: Patient having brachytherapy treatment for prostate
cancer in 1.5 months. Preoperative testing.

EXAM:
CHEST - 2 VIEW

[chest pa]
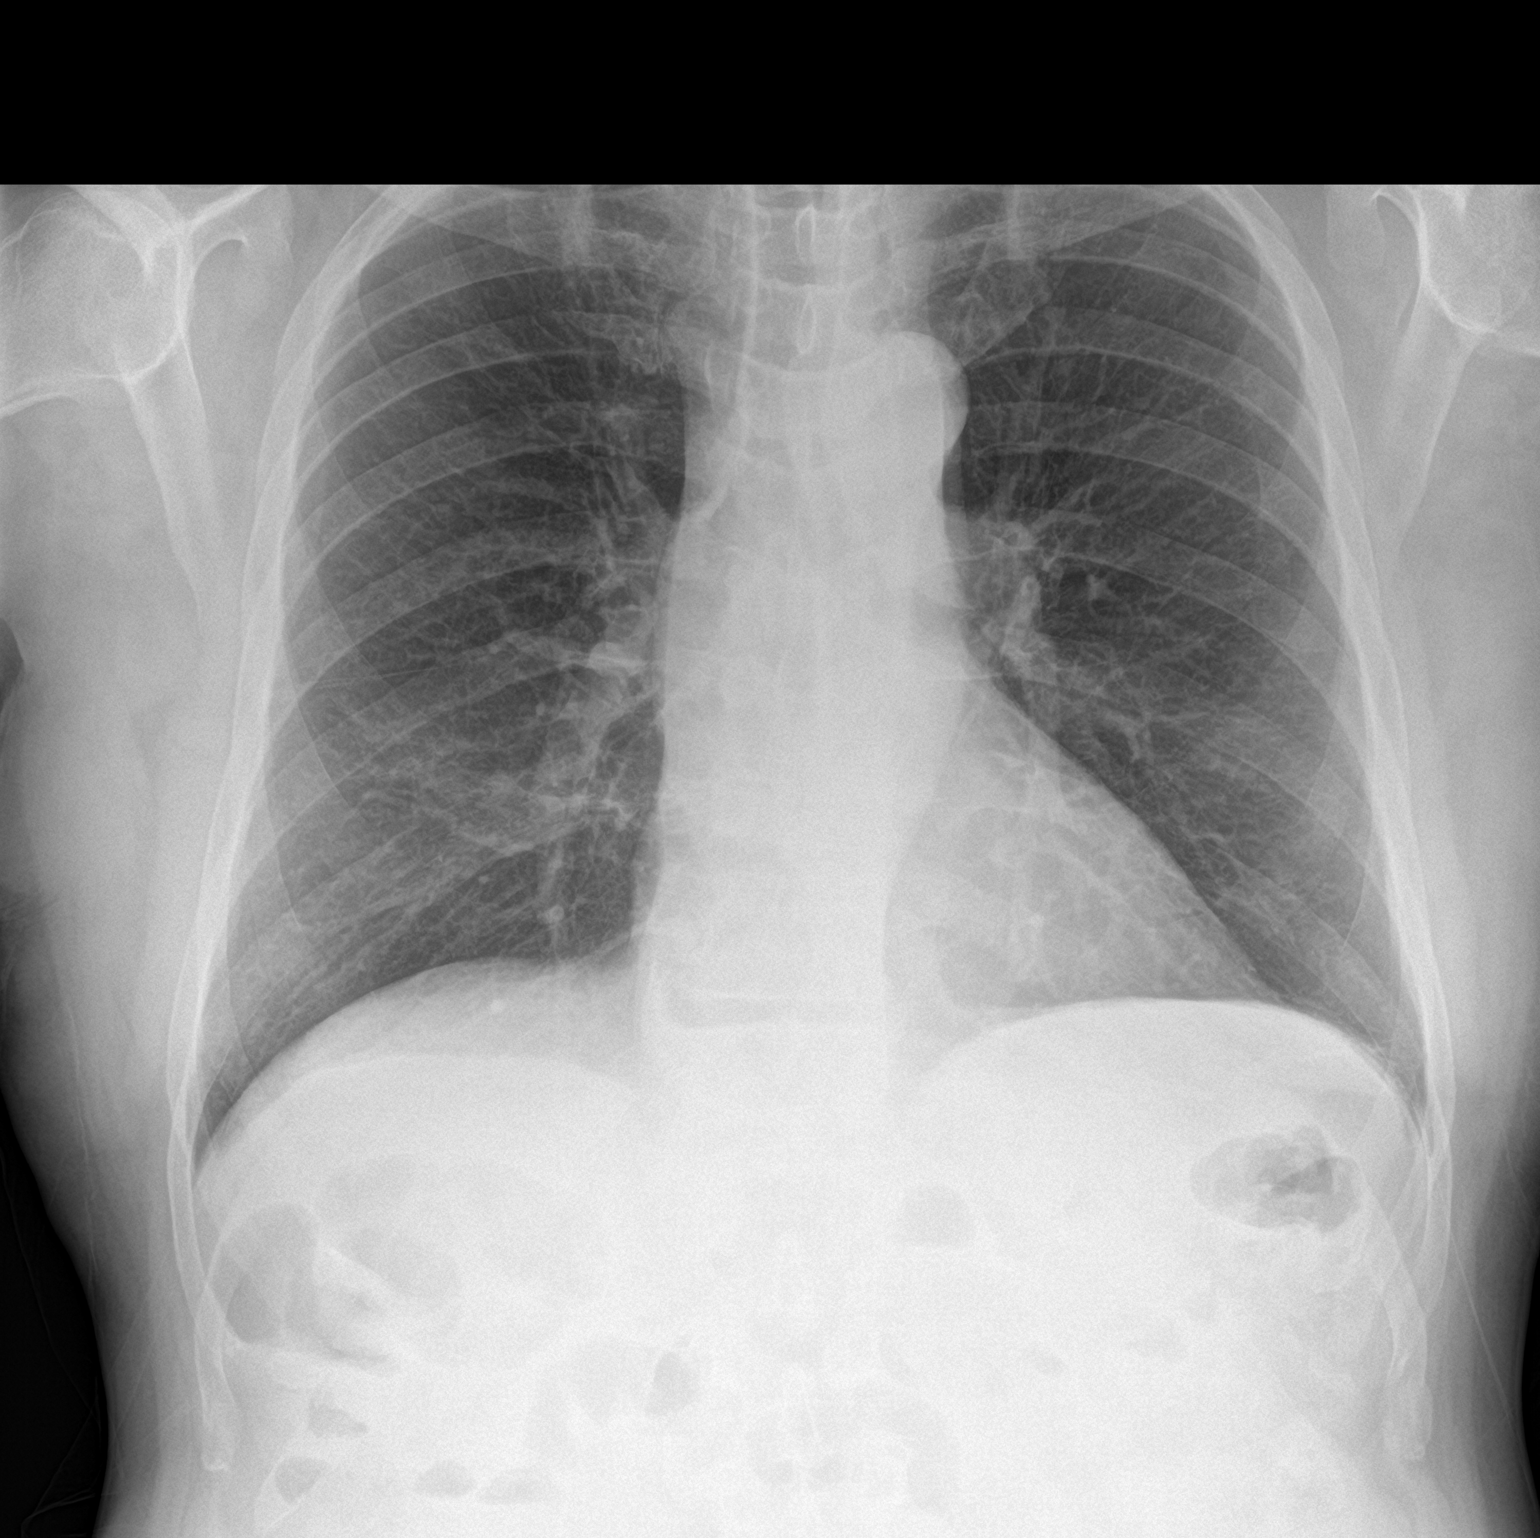

[chest lat]
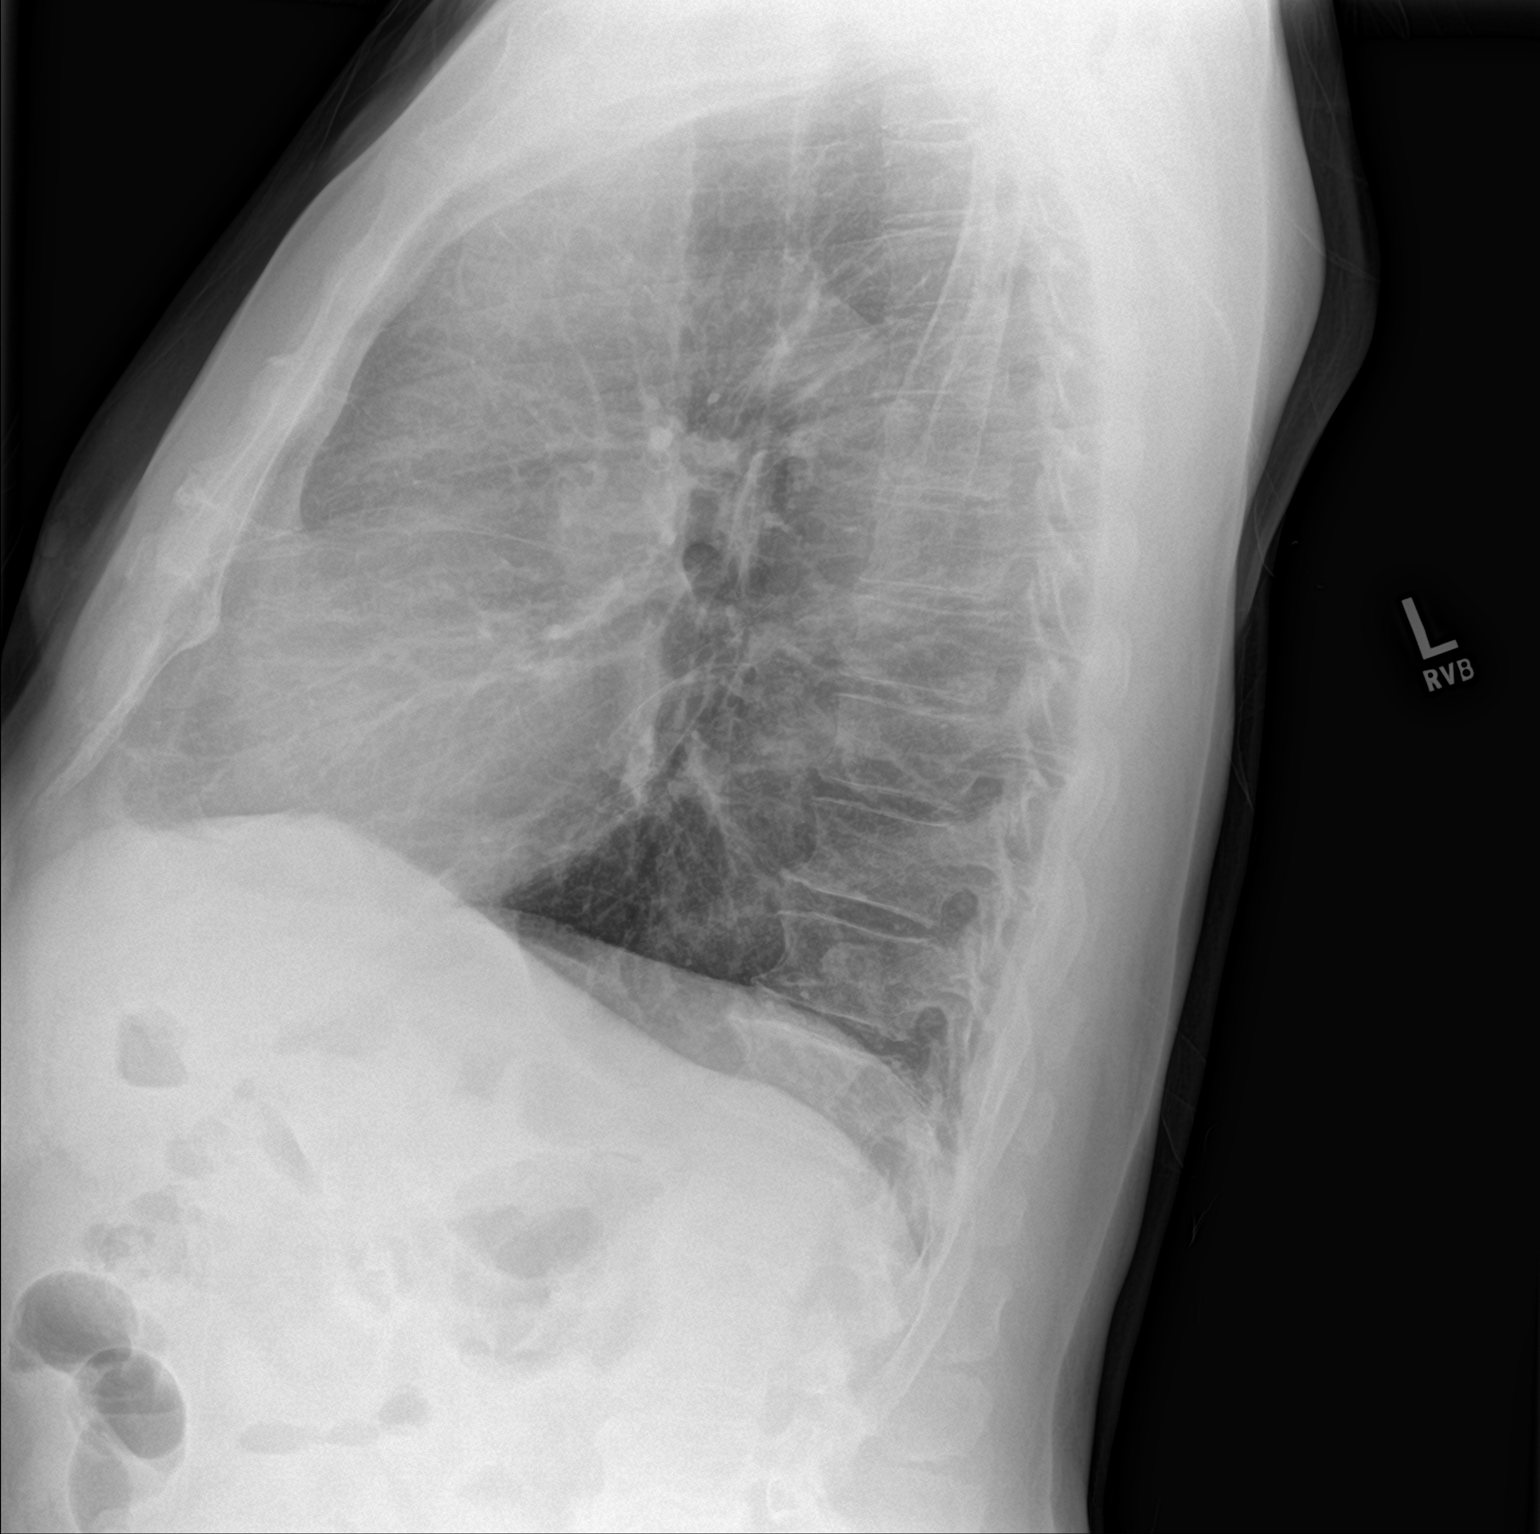

[2 of 2 positions shown; findings below may reference images not displayed]

FINDINGS: The heart size and mediastinal contours are within normal limits.
There is no focal infiltrate, pulmonary edema, or pleural effusion.
The visualized skeletal structures are stable.
IMPRESSION: No active cardiopulmonary disease.

## 2017-10-06 NOTE — Progress Notes (Signed)
  Radiation Oncology         [phone]) [phone] ________________________________  Name: Anthony [name] MRN: [id number]  Date: 10/06/2017  DOB: 11-08-1945  SIMULATION AND TREATMENT PLANNING NOTE PUBIC ARCH STUDY  HA:[id number], Mauro Kaufmann, MD  Alexis Frock, MD  DIAGNOSIS: 72 y.o. gentleman with Stage T1c adenocarcinoma of the prostate with Gleason Score of 3+4, and PSA of 5.07     ICD-10-CM   1. Malignant neoplasm of prostate ([place]) C61     COMPLEX SIMULATION:  The patient presented today for evaluation for possible prostate seed implant. He was brought to the radiation planning suite and placed supine on the CT couch. A 3-dimensional image study set was obtained in upload to the planning computer. There, on each axial slice, I contoured the prostate gland. Then, using three-dimensional radiation planning tools I reconstructed the prostate in view of the structures from the transperineal needle pathway to assess for possible pubic arch interference. In doing so, I did not appreciate any pubic arch interference. Also, the patient's prostate volume was estimated based on the drawn structure. The volume was 28 cc.  Given the pubic arch appearance and prostate volume, patient remains a good candidate to proceed with prostate seed implant. Today, he freely provided informed written consent to proceed.    PLAN: The patient will undergo prostate seed implant.   ________________________________  Sheral Apley. Tammi Klippel, M.D.  This document serves as a record of services personally performed by Tyler Pita, MD. It was created on his behalf by Rae Lips, a trained medical scribe. The creation of this record is based on the scribe's personal observations and the provider's statements to them. This document has been checked and approved by the attending provider.

## 2017-10-08 ENCOUNTER — Other Ambulatory Visit: Payer: Self-pay | Admitting: Urology

## 2017-10-08 DIAGNOSIS — C61 Malignant neoplasm of prostate: Secondary | ICD-10-CM

## 2017-10-12 DIAGNOSIS — C61 Malignant neoplasm of prostate: Secondary | ICD-10-CM | POA: Diagnosis not present

## 2017-10-12 DIAGNOSIS — G609 Hereditary and idiopathic neuropathy, unspecified: Secondary | ICD-10-CM | POA: Diagnosis not present

## 2017-10-12 DIAGNOSIS — E1165 Type 2 diabetes mellitus with hyperglycemia: Secondary | ICD-10-CM | POA: Diagnosis not present

## 2017-10-12 DIAGNOSIS — E7800 Pure hypercholesterolemia, unspecified: Secondary | ICD-10-CM | POA: Diagnosis not present

## 2017-10-13 ENCOUNTER — Encounter: Payer: Self-pay | Admitting: *Deleted

## 2017-10-13 NOTE — Progress Notes (Signed)
On 10-13-17 call pt to pick up medical records.

## 2017-11-05 DIAGNOSIS — H40033 Anatomical narrow angle, bilateral: Secondary | ICD-10-CM | POA: Diagnosis not present

## 2017-11-05 DIAGNOSIS — E119 Type 2 diabetes mellitus without complications: Secondary | ICD-10-CM | POA: Diagnosis not present

## 2017-11-05 DIAGNOSIS — H353131 Nonexudative age-related macular degeneration, bilateral, early dry stage: Secondary | ICD-10-CM | POA: Diagnosis not present

## 2017-11-05 DIAGNOSIS — H40023 Open angle with borderline findings, high risk, bilateral: Secondary | ICD-10-CM | POA: Diagnosis not present

## 2017-11-19 ENCOUNTER — Telehealth: Payer: Self-pay | Admitting: *Deleted

## 2017-11-19 NOTE — Telephone Encounter (Signed)
CALLED PATIENT TO REMIND OF LAB APPT. FOR 11-22-17- ARRIVAL TIME - 10:45 AM @ [facility]ITT[facility]

## 2017-11-22 ENCOUNTER — Encounter (HOSPITAL_COMMUNITY)
Admission: RE | Admit: 2017-11-22 | Discharge: 2017-11-22 | Disposition: A | Discharge status: Home / Self Care | Payer: Medicare Other | Source: Ambulatory Visit | Attending: Urology | Admitting: Urology

## 2017-11-22 DIAGNOSIS — Z01818 Encounter for other preprocedural examination: Secondary | ICD-10-CM | POA: Insufficient documentation

## 2017-11-22 LAB — CBC
HCT: 47.5 % (ref 39.0–52.0)
Hemoglobin: 16.4 g/dL (ref 13.0–17.0)
MCH: 32 pg (ref 26.0–34.0)
MCHC: 34.5 g/dL (ref 30.0–36.0)
MCV: 92.8 fL (ref 78.0–100.0)
Platelets: 235 10*3/uL (ref 150–400)
RBC: 5.12 MIL/uL (ref 4.22–5.81)
RDW: 13.6 % (ref 11.5–15.5)
WBC: 10.5 10*3/uL (ref 4.0–10.5)

## 2017-11-22 LAB — PROTIME-INR
INR: 1.12
Prothrombin Time: 14.3 seconds (ref 11.4–15.2)

## 2017-11-22 LAB — COMPREHENSIVE METABOLIC PANEL
ALT: 25 U/L (ref 17–63)
AST: 27 U/L (ref 15–41)
Albumin: 3.4 g/dL — ABNORMAL LOW (ref 3.5–5.0)
Alkaline Phosphatase: 106 U/L (ref 38–126)
Anion gap: 11 (ref 5–15)
BUN: 8 mg/dL (ref 6–20)
CO2: 23 mmol/L (ref 22–32)
Calcium: 8.6 mg/dL — ABNORMAL LOW (ref 8.9–10.3)
Chloride: 105 mmol/L (ref 101–111)
Creatinine, Ser: 1.17 mg/dL (ref 0.61–1.24)
GFR calc Af Amer: >60 mL/min (ref >60)
GFR calc non Af Amer: >60 mL/min (ref >60)
Glucose, Bld: 272 mg/dL — ABNORMAL HIGH (ref 65–99)
Potassium: 4 mmol/L (ref 3.5–5.1)
Sodium: 139 mmol/L (ref 135–145)
Total Bilirubin: 1.2 mg/dL (ref 0.3–1.2)
Total Protein: 6.6 g/dL (ref 6.5–8.1)

## 2017-11-22 LAB — APTT: aPTT: 27 seconds (ref 24–36)

## 2017-11-24 ENCOUNTER — Encounter (HOSPITAL_BASED_OUTPATIENT_CLINIC_OR_DEPARTMENT_OTHER): Payer: Self-pay | Admitting: *Deleted

## 2017-11-24 ENCOUNTER — Other Ambulatory Visit: Payer: Self-pay

## 2017-11-24 NOTE — Progress Notes (Signed)
Spoke w/ pt via phone for pre-op interview.  Npo after mn w/ exception clear liquids until 0815 (no cream/ milk products).  Arrive at [facility].  Current lab results ,dated 11-22-2017, and cxr/ekg in chart and epic.  Will take norvasc and protonix am dos w/ sips of water.  Will do fleet enema am dos.

## 2017-11-24 NOTE — Progress Notes (Signed)
   11/24/17 1022  OBSTRUCTIVE SLEEP APNEA  Have you ever been diagnosed with sleep apnea through a sleep study? No  Do you snore loudly (loud enough to be heard through closed doors)?  1  Do you often feel tired, fatigued, or sleepy during the daytime (such as falling asleep during driving or talking to someone)? 0  Has anyone observed you stop breathing during your sleep? 1  Do you have, or are you being treated for high blood pressure? 1  BMI more than 35 kg/m2? 0  Age > 71 (1-yes) 1  Male Gender (Yes=1) 1  Obstructive Sleep Apnea Score 5  Score 5 or greater  Results sent to PCP

## 2017-11-25 ENCOUNTER — Telehealth: Payer: Self-pay | Admitting: *Deleted

## 2017-11-25 NOTE — Telephone Encounter (Signed)
CALLED PATIENT TO REMIND OF PROCEDURE FOR 11-29-17, SPOKE WITH WIFE AND SHE IS AWARE OF THIS PROCEDURE

## 2017-11-26 DIAGNOSIS — C61 Malignant neoplasm of prostate: Secondary | ICD-10-CM | POA: Diagnosis not present

## 2017-11-29 ENCOUNTER — Other Ambulatory Visit: Payer: Self-pay

## 2017-11-29 ENCOUNTER — Ambulatory Visit (HOSPITAL_BASED_OUTPATIENT_CLINIC_OR_DEPARTMENT_OTHER)
Admission: RE | Admit: 2017-11-29 | Discharge: 2017-11-29 | Disposition: A | Discharge status: Home / Self Care | Payer: Medicare Other | Source: Ambulatory Visit | Attending: Urology | Admitting: Urology

## 2017-11-29 ENCOUNTER — Encounter (HOSPITAL_BASED_OUTPATIENT_CLINIC_OR_DEPARTMENT_OTHER): Payer: Self-pay | Admitting: *Deleted

## 2017-11-29 ENCOUNTER — Ambulatory Visit (HOSPITAL_BASED_OUTPATIENT_CLINIC_OR_DEPARTMENT_OTHER): Payer: Medicare Other | Admitting: Anesthesiology

## 2017-11-29 ENCOUNTER — Encounter (HOSPITAL_BASED_OUTPATIENT_CLINIC_OR_DEPARTMENT_OTHER)
Admission: RE | Disposition: A | Discharge status: Home / Self Care | Payer: Self-pay | Source: Ambulatory Visit | Attending: Urology

## 2017-11-29 ENCOUNTER — Ambulatory Visit (HOSPITAL_COMMUNITY): Payer: Medicare Other

## 2017-11-29 DIAGNOSIS — K219 Gastro-esophageal reflux disease without esophagitis: Secondary | ICD-10-CM | POA: Insufficient documentation

## 2017-11-29 DIAGNOSIS — I10 Essential (primary) hypertension: Secondary | ICD-10-CM | POA: Diagnosis not present

## 2017-11-29 DIAGNOSIS — Z9049 Acquired absence of other specified parts of digestive tract: Secondary | ICD-10-CM | POA: Diagnosis not present

## 2017-11-29 DIAGNOSIS — N138 Other obstructive and reflux uropathy: Secondary | ICD-10-CM | POA: Insufficient documentation

## 2017-11-29 DIAGNOSIS — C61 Malignant neoplasm of prostate: Secondary | ICD-10-CM | POA: Diagnosis not present

## 2017-11-29 DIAGNOSIS — E785 Hyperlipidemia, unspecified: Secondary | ICD-10-CM | POA: Insufficient documentation

## 2017-11-29 DIAGNOSIS — N401 Enlarged prostate with lower urinary tract symptoms: Secondary | ICD-10-CM | POA: Diagnosis not present

## 2017-11-29 DIAGNOSIS — G629 Polyneuropathy, unspecified: Secondary | ICD-10-CM | POA: Insufficient documentation

## 2017-11-29 DIAGNOSIS — E119 Type 2 diabetes mellitus without complications: Secondary | ICD-10-CM | POA: Diagnosis not present

## 2017-11-29 DIAGNOSIS — Z91013 Allergy to seafood: Secondary | ICD-10-CM | POA: Insufficient documentation

## 2017-11-29 DIAGNOSIS — Z85038 Personal history of other malignant neoplasm of large intestine: Secondary | ICD-10-CM | POA: Diagnosis not present

## 2017-11-29 DIAGNOSIS — Z79899 Other long term (current) drug therapy: Secondary | ICD-10-CM | POA: Diagnosis not present

## 2017-11-29 DIAGNOSIS — E1142 Type 2 diabetes mellitus with diabetic polyneuropathy: Secondary | ICD-10-CM | POA: Diagnosis not present

## 2017-11-29 DIAGNOSIS — Z7982 Long term (current) use of aspirin: Secondary | ICD-10-CM | POA: Diagnosis not present

## 2017-11-29 DIAGNOSIS — R3915 Urgency of urination: Secondary | ICD-10-CM | POA: Diagnosis not present

## 2017-11-29 DIAGNOSIS — Z87891 Personal history of nicotine dependence: Secondary | ICD-10-CM | POA: Insufficient documentation

## 2017-11-29 DIAGNOSIS — Z794 Long term (current) use of insulin: Secondary | ICD-10-CM | POA: Diagnosis not present

## 2017-11-29 DIAGNOSIS — Z01818 Encounter for other preprocedural examination: Secondary | ICD-10-CM

## 2017-11-29 HISTORY — PX: RADIOACTIVE SEED IMPLANT: SHX5150

## 2017-11-29 HISTORY — DX: Other specified personal risk factors, not elsewhere classified: Z91.89

## 2017-11-29 HISTORY — PX: CYSTOSCOPY: SHX5120

## 2017-11-29 HISTORY — DX: Other chronic pancreatitis: K86.1

## 2017-11-29 HISTORY — DX: Presence of spectacles and contact lenses: Z97.3

## 2017-11-29 HISTORY — PX: SPACE OAR INSTILLATION: SHX6769

## 2017-11-29 HISTORY — DX: Benign prostatic hyperplasia with lower urinary tract symptoms: N40.1

## 2017-11-29 LAB — POCT I-STAT, CHEM 8
BUN: 9 mg/dL (ref 6–20)
Calcium, Ion: 1.19 mmol/L (ref 1.15–1.40)
Chloride: 104 mmol/L (ref 101–111)
Creatinine, Ser: 1.1 mg/dL (ref 0.61–1.24)
Glucose, Bld: 142 mg/dL — ABNORMAL HIGH (ref 65–99)
HCT: 51 % (ref 39.0–52.0)
Hemoglobin: 17.3 g/dL — ABNORMAL HIGH (ref 13.0–17.0)
Potassium: 3.4 mmol/L — ABNORMAL LOW (ref 3.5–5.1)
Sodium: 142 mmol/L (ref 135–145)
TCO2: 31 mmol/L (ref 22–32)

## 2017-11-29 LAB — GLUCOSE, CAPILLARY
Glucose-Capillary: 79 mg/dL (ref 65–99)
Glucose-Capillary: 224 mg/dL — ABNORMAL HIGH (ref 65–99)

## 2017-11-29 SURGERY — INSERTION, RADIATION SOURCE, PROSTATE
Anesthesia: General | Site: Rectum

## 2017-11-29 MED ORDER — FENTANYL CITRATE (PF) 100 MCG/2ML IJ SOLN
INTRAMUSCULAR | Status: AC
  Filled 2017-11-29: qty 2

## 2017-11-29 MED ORDER — SENNOSIDES-DOCUSATE SODIUM 8.6-50 MG PO TABS: 1.0000 | ORAL_TABLET | Freq: Two times a day (BID) | ORAL | 0 refills | Status: DC

## 2017-11-29 MED ORDER — DEXAMETHASONE SODIUM PHOSPHATE 10 MG/ML IJ SOLN
INTRAMUSCULAR | Status: AC
  Filled 2017-11-29: qty 1

## 2017-11-29 MED ORDER — ACETAMINOPHEN 325 MG PO TABS
325.0000 mg | ORAL_TABLET | ORAL | Status: DC | PRN
  Filled 2017-11-29: qty 2

## 2017-11-29 MED ORDER — MEPERIDINE HCL 25 MG/ML IJ SOLN
6.2500 mg | INTRAMUSCULAR | Status: DC | PRN
  Filled 2017-11-29: qty 1

## 2017-11-29 MED ORDER — PHENYLEPHRINE HCL 10 MG/ML IJ SOLN
INTRAMUSCULAR | Status: DC | PRN
  Administered 2017-11-29 (×2): 80 ug via INTRAVENOUS

## 2017-11-29 MED ORDER — PHENYLEPHRINE HCL 10 MG/ML IJ SOLN
INTRAMUSCULAR | Status: AC
  Filled 2017-11-29: qty 1

## 2017-11-29 MED ORDER — PROPOFOL 10 MG/ML IV BOLUS
INTRAVENOUS | Status: AC
  Filled 2017-11-29: qty 20

## 2017-11-29 MED ORDER — OXYCODONE HCL 5 MG/5ML PO SOLN
5.0000 mg | Freq: Once | ORAL | Status: DC | PRN
  Filled 2017-11-29: qty 5

## 2017-11-29 MED ORDER — LACTATED RINGERS IV SOLN
INTRAVENOUS | Status: DC
  Administered 2017-11-29 (×3): via INTRAVENOUS
  Filled 2017-11-29: qty 1000

## 2017-11-29 MED ORDER — MIDAZOLAM HCL 2 MG/2ML IJ SOLN
INTRAMUSCULAR | Status: AC
  Filled 2017-11-29: qty 2

## 2017-11-29 MED ORDER — ONDANSETRON HCL 4 MG/2ML IJ SOLN
INTRAMUSCULAR | Status: AC
  Filled 2017-11-29: qty 2

## 2017-11-29 MED ORDER — FENTANYL CITRATE (PF) 100 MCG/2ML IJ SOLN
INTRAMUSCULAR | Status: DC | PRN
  Administered 2017-11-29: 100 ug via INTRAVENOUS

## 2017-11-29 MED ORDER — PROPOFOL 10 MG/ML IV BOLUS
INTRAVENOUS | Status: DC | PRN
  Administered 2017-11-29: 100 mg via INTRAVENOUS
  Administered 2017-11-29: 150 mg via INTRAVENOUS
  Administered 2017-11-29: 50 mg via INTRAVENOUS

## 2017-11-29 MED ORDER — ONDANSETRON HCL 4 MG/2ML IJ SOLN
INTRAMUSCULAR | Status: DC | PRN
  Administered 2017-11-29: 4 mg via INTRAVENOUS

## 2017-11-29 MED ORDER — TRAMADOL HCL 50 MG PO TABS: 50.0000 mg | ORAL_TABLET | Freq: Four times a day (QID) | ORAL | 0 refills | Status: AC | PRN

## 2017-11-29 MED ORDER — MIDAZOLAM HCL 2 MG/2ML IJ SOLN
INTRAMUSCULAR | Status: DC | PRN
  Administered 2017-11-29: 2 mg via INTRAVENOUS

## 2017-11-29 MED ORDER — KETOROLAC TROMETHAMINE 15 MG/ML IJ SOLN
15.0000 mg | Freq: Once | INTRAMUSCULAR | Status: DC
  Filled 2017-11-29: qty 1

## 2017-11-29 MED ORDER — FENTANYL CITRATE (PF) 100 MCG/2ML IJ SOLN
25.0000 ug | INTRAMUSCULAR | Status: DC | PRN
  Filled 2017-11-29: qty 1

## 2017-11-29 MED ORDER — PHENYLEPHRINE HCL 10 MG/ML IJ SOLN
INTRAMUSCULAR | Status: DC | PRN
  Administered 2017-11-29: 50 ug/min via INTRAVENOUS

## 2017-11-29 MED ORDER — SODIUM CHLORIDE 0.9 % IJ SOLN
INTRAMUSCULAR | Status: DC | PRN
  Administered 2017-11-29: 10 mL

## 2017-11-29 MED ORDER — PHENYLEPHRINE 40 MCG/ML (10ML) SYRINGE FOR IV PUSH (FOR BLOOD PRESSURE SUPPORT)
PREFILLED_SYRINGE | INTRAVENOUS | Status: AC
  Filled 2017-11-29: qty 10

## 2017-11-29 MED ORDER — LIDOCAINE 2% (20 MG/ML) 5 ML SYRINGE
INTRAMUSCULAR | Status: DC | PRN
  Administered 2017-11-29: 80 mg via INTRAVENOUS

## 2017-11-29 MED ORDER — GENTAMICIN SULFATE 40 MG/ML IJ SOLN
5.0000 mg/kg | Freq: Once | INTRAVENOUS | Status: DC
  Filled 2017-11-29: qty 12

## 2017-11-29 MED ORDER — IOHEXOL 300 MG/ML  SOLN
INTRAMUSCULAR | Status: DC | PRN
  Administered 2017-11-29: 7 mL

## 2017-11-29 MED ORDER — FLEET ENEMA 7-19 GM/118ML RE ENEM
1.0000 | ENEMA | Freq: Once | RECTAL | Status: DC
  Filled 2017-11-29: qty 1

## 2017-11-29 MED ORDER — LIDOCAINE 2% (20 MG/ML) 5 ML SYRINGE
INTRAMUSCULAR | Status: AC
  Filled 2017-11-29: qty 5

## 2017-11-29 MED ORDER — SODIUM CHLORIDE 0.9 % IR SOLN
Status: DC | PRN
  Administered 2017-11-29: 1000 mL via INTRAVESICAL

## 2017-11-29 MED ORDER — DEXTROSE 5 % IV SOLN
430.0000 mg | INTRAVENOUS | Status: AC
  Administered 2017-11-29: 430 mg via INTRAVENOUS
  Filled 2017-11-29: qty 10.75

## 2017-11-29 MED ORDER — ONDANSETRON HCL 4 MG/2ML IJ SOLN
4.0000 mg | Freq: Once | INTRAMUSCULAR | Status: DC | PRN
  Filled 2017-11-29: qty 2

## 2017-11-29 MED ORDER — ACETAMINOPHEN 160 MG/5ML PO SOLN
325.0000 mg | ORAL | Status: DC | PRN
  Filled 2017-11-29: qty 20.3

## 2017-11-29 MED ORDER — OXYCODONE HCL 5 MG PO TABS
5.0000 mg | ORAL_TABLET | Freq: Once | ORAL | Status: DC | PRN
  Filled 2017-11-29: qty 1

## 2017-11-29 MED ORDER — DEXAMETHASONE SODIUM PHOSPHATE 10 MG/ML IJ SOLN
INTRAMUSCULAR | Status: DC | PRN
  Administered 2017-11-29: 5 mg via INTRAVENOUS

## 2017-11-29 MED ORDER — TAMSULOSIN HCL 0.4 MG PO CAPS: 0.4000 mg | ORAL_CAPSULE | Freq: Every day | ORAL | 11 refills | Status: AC | PRN

## 2017-11-29 MED ORDER — KETOROLAC TROMETHAMINE 30 MG/ML IJ SOLN
INTRAMUSCULAR | Status: AC
  Filled 2017-11-29: qty 1

## 2017-11-29 SURGICAL SUPPLY — 28 items
BAG URINE DRAINAGE (UROLOGICAL SUPPLIES) ×4 IMPLANT
BLADE CLIPPER SURG (BLADE) ×4 IMPLANT
CATH FOLEY 2WAY SLVR  5CC 16FR (CATHETERS) ×1
CATH FOLEY 2WAY SLVR 5CC 16FR (CATHETERS) ×3 IMPLANT
CATH ROBINSON RED A/P 16FR (CATHETERS) IMPLANT
CATH ROBINSON RED A/P 20FR (CATHETERS) ×4 IMPLANT
CLOTH BEACON ORANGE TIMEOUT ST (SAFETY) ×4 IMPLANT
COVER BACK TABLE 60X90IN (DRAPES) ×4 IMPLANT
COVER MAYO STAND STRL (DRAPES) ×4 IMPLANT
DRSG TEGADERM 4X4.75 (GAUZE/BANDAGES/DRESSINGS) ×4 IMPLANT
DRSG TEGADERM 8X12 (GAUZE/BANDAGES/DRESSINGS) ×8 IMPLANT
GAUZE SPONGE 4X4 12PLY STRL (GAUZE/BANDAGES/DRESSINGS) ×4 IMPLANT
GLOVE BIO SURGEON STRL SZ7.5 (GLOVE) ×4 IMPLANT
GLOVE ECLIPSE 8.0 STRL XLNG CF (GLOVE) ×4 IMPLANT
GOWN STRL REUS W/TWL LRG LVL3 (GOWN DISPOSABLE) ×4 IMPLANT
GOWN STRL REUS W/TWL XL LVL3 (GOWN DISPOSABLE) ×4 IMPLANT
HOLDER FOLEY CATH W/STRAP (MISCELLANEOUS) IMPLANT
IMPL SPACEOAR SYSTEM 10ML (MISCELLANEOUS) ×3 IMPLANT
IMPLANT SPACEOAR SYSTEM 10ML (MISCELLANEOUS) ×4
IV SOD CHL 0.9% 1000ML (IV SOLUTION) ×4 IMPLANT
KIT TURNOVER CYSTO (KITS) ×4 IMPLANT
PACK CYSTO (CUSTOM PROCEDURE TRAY) ×4 IMPLANT
SURGILUBE 2OZ TUBE FLIPTOP (MISCELLANEOUS) ×4 IMPLANT
SUT BONE WAX W31G (SUTURE) ×4 IMPLANT
SYRINGE 10CC LL (SYRINGE) ×4 IMPLANT
UNDERPAD 30X30 (UNDERPADS AND DIAPERS) ×8 IMPLANT
WATER STERILE IRR 500ML POUR (IV SOLUTION) ×4 IMPLANT
selectSeed I-125 ×4 IMPLANT

## 2017-11-29 NOTE — Anesthesia Preprocedure Evaluation (Addendum)
Anesthesia Evaluation  Patient identified by MRN, date of birth, ID band Patient awake    Reviewed: Allergy & Precautions, NPO status , Patient's Chart, lab work & pertinent test results  Airway Mallampati: II  TM Distance: >3 FB Neck ROM: Full    Dental no notable dental hx. (+) Teeth Intact, Dental Advisory Given   Pulmonary former smoker,    Pulmonary exam normal breath sounds clear to auscultation       Cardiovascular hypertension, Pt. on medications Normal cardiovascular exam Rhythm:Regular Rate:Normal  No active cardiac symptoms.    Neuro/Psych PSYCHIATRIC DISORDERS Depression Peripheral neuropathy  Neuromuscular disease    GI/Hepatic Neg liver ROS, GERD  Medicated,Colon cancer   Endo/Other  diabetes, Well Controlled, Type 2, Insulin Dependent  Renal/GU negative Renal ROS  negative genitourinary   Musculoskeletal negative musculoskeletal ROS (+)   Abdominal Normal abdominal exam  (+)   Peds negative pediatric ROS (+)  Hematology negative hematology ROS (+)   Anesthesia Other Findings   Reproductive/Obstetrics negative OB ROS                             Anesthesia Physical  Anesthesia Plan  ASA: II  Anesthesia Plan: General   Post-op Pain Management:    Induction: Intravenous  PONV Risk Score and Plan: 2 and Ondansetron  Airway Management Planned: LMA  Additional Equipment:   Intra-op Plan:   Post-operative Plan:   Informed Consent: I have reviewed the patients History and Physical, chart, labs and discussed the procedure including the risks, benefits and alternatives for the proposed anesthesia with the patient or authorized representative who has indicated his/her understanding and acceptance.   Dental advisory given  Plan Discussed with: Surgeon and CRNA  Anesthesia Plan Comments:         Anesthesia Quick Evaluation

## 2017-11-29 NOTE — Anesthesia Postprocedure Evaluation (Signed)
Anesthesia Post Note  Patient: Anthony [name]  Procedure(s) Performed: RADIOACTIVE SEED IMPLANT/BRACHYTHERAPY IMPLANT (N/A Prostate) SPACE OAR INSTILLATION (N/A Rectum) CYSTOSCOPY (N/A Bladder)     Patient location during evaluation: PACU Anesthesia Type: General Level of consciousness: awake and alert Pain management: pain level controlled Vital Signs Assessment: post-procedure vital signs reviewed and stable Respiratory status: spontaneous breathing, nonlabored ventilation, respiratory function stable and patient connected to nasal cannula oxygen Cardiovascular status: blood pressure returned to baseline and stable Postop Assessment: no apparent nausea or vomiting Anesthetic complications: no    Last Vitals:  Vitals:   11/29/17 1710 11/29/17 1715  BP:  132/88  Pulse:  78  Resp: 18 19  Temp:    SpO2:  94%    Last Pain:  Vitals:   11/29/17 1715  TempSrc:   PainSc: 0-No pain                 Catalina Gravel

## 2017-11-29 NOTE — Brief Op Note (Signed)
11/29/2017  4:22 PM  PATIENT:  Micael Hampshire  72 y.o. male  PRE-OPERATIVE DIAGNOSIS:  PROSTATE CANCER  POST-OPERATIVE DIAGNOSIS:  PROSTATE CANCER  PROCEDURE:  Procedure(s) with comments: RADIOACTIVE SEED IMPLANT/BRACHYTHERAPY IMPLANT (N/A) SPACE OAR INSTILLATION (N/A) CYSTOSCOPY (N/A) - no seeds in bladder per Dr Tresa Moore  SURGEON:  Surgeon(s) and Role:    * Alexis Frock, MD - Primary    * Tyler Pita, MD - Assisting  PHYSICIAN ASSISTANT:   ASSISTANTS: none   ANESTHESIA:   general  EBL: minimal  BLOOD ADMINISTERED:none  DRAINS: none   LOCAL MEDICATIONS USED:  NONE  SPECIMEN:  No Specimen  DISPOSITION OF SPECIMEN:  N/A  COUNTS:  YES  TOURNIQUET:  * No tourniquets in log *  DICTATION: .Other Dictation: Dictation Number [id number]  PLAN OF CARE: Discharge to home after PACU  PATIENT DISPOSITION:  PACU - hemodynamically stable.   Delay start of Pharmacological VTE agent (>24hrs) due to surgical blood loss or risk of bleeding: no

## 2017-11-29 NOTE — H&P (Signed)
Anthony [name] is an 72 y.o. male.    Chief Complaint: Pre-Op Prostate Brachytherapy Seed Implantation with SPACE-OAR  HPI:   1 - Moderate Risk Prostate Cancer - 1 core Gleason 3+4=7 RLA 20% on BX 07/2017 on eval rising PSA to 5.07. TRUS 38 mL, no median lobe. He is not interested in primary surgery.   2 - Lower Urinary Tract Symptoms - pt with slowly increasing bother form mix of irritative and obstructive symptoms, mostly urinary urgency. DRE 45gm smooth. 2019 PVR "77mL" He is diabetic but minimal glycosuria. Given tamsulosin trial and reports less bother, happy.   PMH sig for IDDM2 (follows [facility], A1c 7s), ENT surgry, lap segmental colon resection for early cancer (no chemo/XRT). His PCP is Dr. Ashby Dawes with [facility].   Today " Anthony [name] " is seen to proceed with prostate brachytherapy seed implantation.    Past Medical History:  Diagnosis Date  . At risk for sleep apnea    STOP BANG SCORE= 5             SENT TO PCP 11-24-2017  . Chronic calcific pancreatitis ([place]) 2000  . Colon cancer [facility])    09/ 2016  sigmoid cancer  s/p  left hemicolectomy (negative nodes and margins)  . Depression   . GERD (gastroesophageal reflux disease)   . Hemorrhoids   . History of adenomatous polyp of colon   . Hyperlipidemia   . Hyperplasia of prostate with lower urinary tract symptoms (LUTS)   . Hypertension   . Peripheral neuropathy   . Prostate cancer [facility]) urologist-  dr Tresa Moore  oncologist-  dr Tammi Klippel   dx 08-02-2017-- Stage T1c, Gleason, 3+4, PSA 5.07, vol 38cc--  scheduled for radioactive seed implants 11-29-2017  . Type 2 diabetes mellitus with insulin therapy ([place])    Pt reports he is Type 1 Diabetic-diagnosed at age 56/  followed by pcp  . Wears glasses     Past Surgical History:  Procedure Laterality Date  . COLONOSCOPY  last one 03-14-2015  . EVALUATION UNDER ANESTHESIA WITH HEMORRHOIDECTOMY N/A 05/29/2015   Procedure: ANAL EXAM UNDER ANESTHESIA WITH   HEMORRHOIDOPEXY; RIGID SIGMOIDOSCOPY;  Surgeon: Leighton Ruff, MD;  Location: [place];  Service: General;  Laterality: N/A;  . LAPAROSCOPIC PARTIAL COLECTOMY N/A 03/29/2015   Procedure: LAPAROSCOPIC SIGMOIDECTOMY;  Surgeon: Leighton Ruff, MD;  Location: [facility];  Service: General;  Laterality: N/A;  . NASAL SEPTUM SURGERY  2006  . PROSTATE BIOPSY  08-02-2017  dr Tresa Moore office    Family History  Problem Relation Age of Onset  . Hyperlipidemia Father   . Stroke Father   . Prostate cancer Father   . Diabetes Maternal Uncle   . Cancer Brother        unknown blood cancer   Social History:  reports that he quit smoking about 8 years ago. His smoking use included cigarettes. He quit after 30.00 years of use. He has never used smokeless tobacco. He reports that he drinks alcohol. He reports that he does not use drugs.  Allergies:  Allergies  Allergen Reactions  . Other Other (See Comments)    Positive allergy test for peanuts and almonds  . Shellfish Allergy Other (See Comments)    Positive allergy test  . Toradol [Ketorolac Tromethamine] Other (See Comments)    Pt has chronic calcific pancreatitis    Medications Prior to Admission  Medication Sig Dispense Refill  . amLODipine (NORVASC) 10 MG tablet Take 10 mg by mouth every  morning.     Marland Kitchen aspirin 325 MG tablet Take 325 mg by mouth 2 (two) times a week. And takes as needed for headaches    . carboxymethylcellulose (REFRESH PLUS) 0.5 % SOLN Apply 1 drop to eye daily as needed (FOR EYE IRRITATION).    . Chlorpheniramine-Phenylephrine (CERON PO) Take by mouth daily. Takes half tablet daily at supper    . CHOLESTYRAMINE PO Take by mouth once a week.    . hydrochlorothiazide (MICROZIDE) 12.5 MG capsule Take 12.5 mg by mouth daily.     . insulin aspart (NOVOLOG) 100 UNIT/ML injection Inject 0.25-4 Units into the skin 3 (three) times daily before meals. Plus after snacks--  Sliding Scale    . lisinopril (PRINIVIL,ZESTRIL) 40 MG  tablet Take 40 mg by mouth every morning.     . pantoprazole (PROTONIX) 40 MG tablet Take 40 mg by mouth every morning.     . tamsulosin (FLOMAX) 0.4 MG CAPS capsule Take 1 capsule (0.4 mg total) by mouth daily after supper. (Patient taking differently: Take 0.4 mg by mouth daily after supper. ) 30 capsule 5    No results found for this or any previous visit (from the past 48 hour(s)). No results found.  Review of Systems  Constitutional: Negative.  Negative for chills and fever.  HENT: Negative.   Eyes: Negative.   Respiratory: Negative.   Cardiovascular: Negative.   Gastrointestinal: Negative.   Genitourinary: Negative.   Musculoskeletal: Negative.   Skin: Negative.   Neurological: Negative.   Endo/Heme/Allergies: Negative.   Psychiatric/Behavioral: Negative.     Blood pressure 132/90, pulse 90, temperature 99.1 F (37.3 C), temperature source Oral, resp. rate 16, height 6\' 2"  (1.88 m), weight 93.6 kg (206 lb 6.4 oz), SpO2 99 %. Physical Exam  Constitutional: He appears well-developed.  HENT:  Head: Normocephalic.  Eyes: Pupils are equal, round, and reactive to light.  Neck: Normal range of motion.  Cardiovascular: Normal rate.  Respiratory: Effort normal.  GI: Soft.  Multiple scars w/o hernias.   Genitourinary:  Genitourinary Comments: No CVAT.   Musculoskeletal: Normal range of motion.  Neurological: He is alert.  Skin: Skin is warm.  Psychiatric: He has a normal mood and affect.     Assessment/Plan  Proceed as planned with prostate brachytherapy seed implantation with SPACE-OAR. Risks, benefits, alternatives, expected peri-op course discussed previously and reiterated today.  Alexis Frock, MD 11/29/2017, 12:31 PM

## 2017-11-29 NOTE — Transfer of Care (Signed)
Immediate Anesthesia Transfer of Care Note  Patient: Anthony [name]  Procedure(s) Performed: RADIOACTIVE SEED IMPLANT/BRACHYTHERAPY IMPLANT (N/A Prostate) SPACE OAR INSTILLATION (N/A Rectum) CYSTOSCOPY (N/A Bladder)  Patient Location: PACU  Anesthesia Type:General  Level of Consciousness: awake, alert , oriented and patient cooperative  Airway & Oxygen Therapy: Patient Spontanous Breathing and Patient connected to nasal cannula oxygen  Post-op Assessment: Report given to RN and Post -op Vital signs reviewed and stable  Post vital signs: Reviewed and stable  Last Vitals:  Vitals Value Taken Time  BP 120/86 11/29/2017  4:34 PM  Temp 36.8 C 11/29/2017  4:34 PM  Pulse 89 11/29/2017  4:37 PM  Resp 16 11/29/2017  4:37 PM  SpO2 94 % 11/29/2017  4:37 PM  Vitals shown include unvalidated device data.  Last Pain:  Vitals:   11/29/17 1211  TempSrc: Oral      Patients Stated Pain Goal: 10 (67/54/49 2010)  Complications: No apparent anesthesia complications

## 2017-11-29 NOTE — Discharge Instructions (Signed)
Radioactive Seed Implant Home Care Instructions   Activity:    Rest for the remainder of the day.  Do not drive or operate equipment today.  You may resume normal  activities in a few days as instructed by your physician, without risk of harmful radiation exposure to those around you, provided you follow the time and distance precautions on the Radiation Oncology Instruction Sheet.   Meals: Drink plenty of lipuids and eat light foods, such as gelatin or soup this evening .  You may return to normal meal plan tomorrow.  Return To Work: You may return to work as instructed by your physician.  Special Instruction:   If any seeds are found, use tweezers to pick up seeds and place in a glass container of any kind and bring to your physician's office.  Call your physician if any of these symptoms occur:   Persistent or heavy bleeding  Urine stream diminishes or stops completely after catheter is removed  Fever equal to or greater than 101 degrees F  Cloudy urine with a strong foul odor  Severe pain  You may feel some burning pain and/or hesitancy when you urinate after the catheter is removed.  These symptoms may increase over the next few weeks, but should diminish within forur to six weeks.  Applying moist heat to the lower abdomen or a hot tub bath may help relieve the pain.  If the discomfort becomes severe, please call your physician for additional medications.     Post Anesthesia Home Care Instructions  Activity: Get plenty of rest for the remainder of the day. A responsible individual must stay with you for 24 hours following the procedure.  For the next 24 hours, DO NOT: -Drive a car -Operate machinery -Drink alcoholic beverages -Take any medication unless instructed by your physician -Make any legal decisions or sign important papers.  Meals: Start with liquid foods such as gelatin or soup. Progress to regular foods as tolerated. Avoid greasy, spicy, heavy foods. If  nausea and/or vomiting occur, drink only clear liquids until the nausea and/or vomiting subsides. Call your physician if vomiting continues.  Special Instructions/Symptoms: Your throat may feel dry or sore from the anesthesia or the breathing tube placed in your throat during surgery. If this causes discomfort, gargle with warm salt water. The discomfort should disappear within 24 hours.  If you had a scopolamine patch placed behind your ear for the management of post- operative nausea and/or vomiting:  1. The medication in the patch is effective for 72 hours, after which it should be removed.  Wrap patch in a tissue and discard in the trash. Wash hands thoroughly with soap and water. 2. You may remove the patch earlier than 72 hours if you experience unpleasant side effects which may include dry mouth, dizziness or visual disturbances. 3. Avoid touching the patch. Wash your hands with soap and water after contact with the patch.     

## 2017-11-29 NOTE — Anesthesia Procedure Notes (Signed)
Procedure Name: LMA Insertion Date/Time: 11/29/2017 2:54 PM Performed by: Wanita Chamberlain, CRNA Pre-anesthesia Checklist: Patient identified, Emergency Drugs available, Suction available, Patient being monitored and Timeout performed Patient Re-evaluated:Patient Re-evaluated prior to induction Oxygen Delivery Method: Circle system utilized Preoxygenation: Pre-oxygenation with 100% oxygen Induction Type: IV induction Ventilation: Mask ventilation without difficulty LMA: LMA inserted LMA Size: 5.0 Number of attempts: 1 Placement Confirmation: positive ETCO2,  CO2 detector and breath sounds checked- equal and bilateral Tube secured with: Tape Dental Injury: Teeth and Oropharynx as per pre-operative assessment

## 2017-11-30 ENCOUNTER — Encounter (HOSPITAL_BASED_OUTPATIENT_CLINIC_OR_DEPARTMENT_OTHER): Payer: Self-pay | Admitting: Urology

## 2017-11-30 NOTE — Op Note (Signed)
NAMEGIAVANNI, [name] MEDICAL RECORD OA:[id number] ACCOUNT [id number] DATE OF BIRTH:11/04/1945 FACILITY: [facility] LOCATION: [facility] PHYSICIAN:Lynk Marti Tresa Moore, MD  OPERATIVE REPORT  DATE OF PROCEDURE:  11/29/2017  PREOPERATIVE DIAGNOSIS:  Moderate risk prostate cancer.  PROCEDURE PERFORMED: 1.  Radiation seed implantation. 2.  Spacerplacement. 3.  Cystoscopy.  RADIATION ONCOLOGY PHYSICIAN:  Tyler Pita, MD  ESTIMATED BLOOD LOSS:  Nil.  COMPLICATIONS:  None.  SPECIMENS:  None.  RADIATION PARAMETERS:  145 Gy, prescribed dose over 55 seeds and 17 cannulas.  FINDINGS: 1.  Excellent placement of 55 seeds as per radiation plan. 2.  Excellent spacer gel matrix placement with displacement of anterior rectum away from posterior prostate. 3.  No obvious intraluminal seeds with postprocedure cystoscopy.  INDICATIONS:  The patient is a very pleasant 72 year old gentleman who was found on workup of a rising PSA to have a small volume but moderate risk adenocarcinoma of the prostate.  He has minimal comorbidity.  He is almost 72 years old.  He has a  prostate under 40 g.  Options were discussed for management including surveillance protocols versus ablative therapy for surgical extirpation.  He adamantly wished to proceed with prostate brachytherapy.  He was felt to be an adequate candidate for this  by both myself and our Radiation Oncology colleagues, and he presents for this today.  Informed consent was signed and placed in the medical record.  The patient being identified.  The correct procedure being radiation, brachytherapy seed implantation  with spacer and cystoscopy was confirmed.  Procedure time out was performed. IV antibiotics administered and general LMA anesthesia induced.  The patient was placed in the high lithotomy position.  Sterilely scrubbed, prepped and draped his penis,  perineum, and proximal thighs using noniodinated prep given history of a SHELLFISH ALLERGY.  Foley  catheter was placed per urethra.  Straight drain with contrast in the balloon.  Transrectal ultrasound in a grid was applied, and preoperative planning  performed as per Radiation Oncology note.  Final planning called for 55 seeds over 17 needles, and these were then sequentially introduced in the anterior-posterior direction according to plan with excellent placement of seeds.  Spot fluoroscopic images  anterior, posterior, and oblique revealed no obvious extraprostatic seeds.  Attention was directed to spacer placement.  The transrectal ultrasound was taken off of anterior traction, allowing the plane of the perirectal fat to further develop, and the  included spacer finder needle was placed at the mid-gland prostate within the anterior perirectal fat.  This was corroborated with an injection of saline at 2 mL.  Next, 10 mL of a spacer gel matrix was introduced over a 10-second push mid-gland  prostate, which revealed an excellent posterior displacement of the anterior rectal wall.  Cystoscopy was then performed using a 16-French flexible cystoscope, and inspection of the anterior and posterior urethra was unremarkable.  Inspection of the  urinary bladder with no diverticula, calcifications, papillary lesions or intraluminal seeds.  Retroflexion revealed no additional findings.  The bladder was purposely left with approximately 200 mL of saline.  Cystoscope removed and the procedure  terminated.  The patient tolerated the procedure well and no immediate perineal complications.  The patient was taken to postanesthesia care in stable condition.  LN/NUANCE  D:11/29/2017 T:11/30/2017 JOB:[id number]

## 2017-11-30 NOTE — Progress Notes (Signed)
  Radiation Oncology         [phone] ________________________________  Name: Anthony [name] MRN: [id number]  Date: 11/30/2017  DOB: [birth year]       Prostate Seed Implant  WN:[id number], Mauro Kaufmann, MD  No ref. provider found  DIAGNOSIS: 72 y.o. gentleman with Stage T1c adenocarcinoma of the prostate with Gleason Score of 3+4, and PSA of 5.07    ICD-10-CM   1. Pre-op testing Z01.818 DG Chest 2 View    DG Chest 2 View    PROCEDURE: Insertion of radioactive I-125 seeds into the prostate gland.  RADIATION DOSE: 145 Gy, definitive therapy.  TECHNIQUE: Anthony [name] was brought to the operating room with the urologist. He was placed in the dorsolithotomy position. He was catheterized and a rectal tube was inserted. The perineum was shaved, prepped and draped. The ultrasound probe was then introduced into the rectum to see the prostate gland.  TREATMENT DEVICE: A needle grid was attached to the ultrasound probe stand and anchor needles were placed.  3D PLANNING: The prostate was imaged in 3D using a sagittal sweep of the prostate probe. These images were transferred to the planning computer. There, the prostate, urethra and rectum were defined on each axial reconstructed image. Then, the software created an optimized 3D plan and a few seed positions were adjusted. The quality of the plan was reviewed using [facility] information for the target and the following two organs at risk:  Urethra and Rectum.  Then the accepted plan was uploaded to the seed Selectron afterloading unit.  PROSTATE VOLUME STUDY:  Using transrectal ultrasound the volume of the prostate was verified to be 21.1 cc.  SPECIAL TREATMENT PROCEDURE/SUPERVISION AND HANDLING: The Nucletron FIRST system was used to place the needles under sagittal guidance. A total of 17 needles were used to deposit 55 seeds in the prostate gland. The individual seed activity was 0.375 mCi.  SpaceOAR:  Yes  COMPLEX SIMULATION: At the end of the procedure, an  anterior radiograph of the pelvis was obtained to document seed positioning and count. Cystoscopy was performed to check the urethra and bladder.  MICRODOSIMETRY: At the end of the procedure, the patient was emitting 0.102 mR/hr at 1 meter. Accordingly, he was considered safe for hospital discharge.  PLAN: The patient will return to the radiation oncology clinic for post implant CT dosimetry in three weeks.   ________________________________  Sheral Apley Tammi Klippel, M.D.

## 2017-12-09 ENCOUNTER — Telehealth: Payer: Self-pay | Admitting: *Deleted

## 2017-12-09 NOTE — Telephone Encounter (Signed)
CALLED PATIENT TO REMIND OF POST SEED APPTS. FOR 12-10-17, NO ANSWER

## 2017-12-10 ENCOUNTER — Ambulatory Visit
Admission: RE | Admit: 2017-12-10 | Discharge: 2017-12-10 | Disposition: A | Discharge status: Home / Self Care | Payer: Medicare Other | Source: Ambulatory Visit | Attending: Radiation Oncology | Admitting: Radiation Oncology

## 2017-12-10 ENCOUNTER — Ambulatory Visit (HOSPITAL_COMMUNITY)
Admission: RE | Admit: 2017-12-10 | Discharge: 2017-12-10 | Disposition: A | Discharge status: Home / Self Care | Payer: Medicare Other | Source: Ambulatory Visit | Attending: Urology | Admitting: Urology

## 2017-12-10 ENCOUNTER — Other Ambulatory Visit: Payer: Self-pay

## 2017-12-10 ENCOUNTER — Encounter: Payer: Self-pay | Admitting: Radiation Oncology

## 2017-12-10 DIAGNOSIS — C61 Malignant neoplasm of prostate: Secondary | ICD-10-CM | POA: Insufficient documentation

## 2017-12-10 DIAGNOSIS — Z79899 Other long term (current) drug therapy: Secondary | ICD-10-CM | POA: Insufficient documentation

## 2017-12-10 DIAGNOSIS — R350 Frequency of micturition: Secondary | ICD-10-CM | POA: Diagnosis not present

## 2017-12-10 DIAGNOSIS — Z794 Long term (current) use of insulin: Secondary | ICD-10-CM | POA: Insufficient documentation

## 2017-12-10 DIAGNOSIS — R3911 Hesitancy of micturition: Secondary | ICD-10-CM | POA: Insufficient documentation

## 2017-12-10 DIAGNOSIS — Z510 Encounter for antineoplastic radiation therapy: Secondary | ICD-10-CM | POA: Diagnosis not present

## 2017-12-10 DIAGNOSIS — Z7982 Long term (current) use of aspirin: Secondary | ICD-10-CM | POA: Diagnosis not present

## 2017-12-10 NOTE — Progress Notes (Signed)
Radiation Oncology         [phone] ________________________________  Name: Anthony [name] MRN: [id number]  Date: 12/10/2017  DOB: 03/13/1946  Post-Seed Follow-Up Visit Note  CC: Anthony Seashore, MD  Anthony Frock, MD  Diagnosis:   72 y.o. male with Stage T1cadenocarcinoma of the prostate with Gleason Score of 3+4, and PSA of5.07    ICD-10-CM   1. Malignant neoplasm of prostate ([place]) C61     Interval Since Last Radiation:  1.5 weeks - Insertion of radioactive I-125 seeds into the prostate gland, 145 Gy definitive therapy, with SpaceOAR  Narrative:  The patient returns today for routine follow-up.  He is complaining of increased urinary frequency and urinary hesitation symptoms. He filled out a questionnaire regarding urinary function today providing and overall IPSS score of 19 characterizing his symptoms as moderate.  His pre-implant score was 28. He denies dysuria, gross hematuria or inability to completely empty his baldder.  He has residual urgency, increased daytime frequency and nocturia x4/night but feels that his LUTS are gradually improving.  He continues to struggle with exccessive bowel frequency but reports that this is no worse than it was prior to brachytherapy.  He is under the care of Dr. Alessandra [name] at [facility] for management of his chronic bowel issues.  He reports that his energy level has been decreased slightly but overall, is doing quite well.  ALLERGIES:  is allergic to other; shellfish allergy; and toradol [ketorolac tromethamine].  Meds: Current Outpatient Medications  Medication Sig Dispense Refill  . ACCU-CHEK COMPACT PLUS test strip   5  . amLODipine (NORVASC) 10 MG tablet Take 10 mg by mouth every morning.     Marland Kitchen aspirin 325 MG tablet Take 325 mg by mouth 2 (two) times a week. And takes as needed for headaches    . carboxymethylcellulose (REFRESH PLUS) 0.5 % SOLN Apply 1 drop to eye daily as needed (FOR EYE IRRITATION).    .  Chlorpheniramine-Phenylephrine (CERON PO) Take by mouth daily. Takes half tablet daily at supper    . CHOLESTYRAMINE PO Take by mouth once a week.    . colestipol (COLESTID) 1 g tablet     . hydrochlorothiazide (MICROZIDE) 12.5 MG capsule Take 12.5 mg by mouth daily.     . insulin aspart (NOVOLOG) 100 UNIT/ML injection Inject 0.25-4 Units into the skin 3 (three) times daily before meals. Plus after snacks--  Sliding Scale    . lisinopril (PRINIVIL,ZESTRIL) 40 MG tablet Take 40 mg by mouth every morning.     . pantoprazole (PROTONIX) 40 MG tablet Take 40 mg by mouth every morning.     . senna-docusate (SENOKOT-S) 8.6-50 MG tablet Take 1 tablet by mouth 2 (two) times daily. While taking strong pain meds to prevent constipation. 20 tablet 0  . tamsulosin (FLOMAX) 0.4 MG CAPS capsule Take 1 capsule (0.4 mg total) by mouth daily as needed. For urinary urgency after prostate radiation. 30 capsule 11  . traMADol (ULTRAM) 50 MG tablet Take 1-2 tablets (50-100 mg total) by mouth every 6 (six) hours as needed for moderate pain. Post-operatively 20 tablet 0   No current facility-administered medications for this encounter.     Physical Findings: In general this is a well appearing African [place] male in no acute distress. He's alert and oriented x4 and appropriate throughout the examination. Cardiopulmonary assessment is negative for acute distress and he exhibits normal effort.   Lab Findings: Lab Results  Component Value Date   WBC 10.5 11/22/2017  HGB 17.3 (H) 11/29/2017   HCT 51.0 11/29/2017   MCV 92.8 11/22/2017   PLT 235 11/22/2017    Radiographic Findings:  Patient underwent CT imaging in our clinic for post implant dosimetry. The CT was reviewed by Anthony [name] and appears to demonstrate an adequate distribution of radioactive seeds throughout the prostate gland. There are no seeds in or near the rectum.  He is scheduled for prostate MRI today at 5 PM and these images will be fused with  his CT images for further evaluation.  We suspect the final radiation plan and dosimetry will show appropriate coverage of the prostate gland.   Impression/Plan: The patient is recovering from the effects of radiation. His urinary symptoms should gradually improve over the next 4-6 months. We talked about this today. He is encouraged by his improvement already and is otherwise pleased with his outcome. We also talked about long-term follow-up for prostate cancer following seed implant. He understands that ongoing PSA determinations and digital rectal exams will help perform surveillance to rule out disease recurrence. He has a follow up appointment scheduled with Dr. Tresa Moore on 03/07/18. He understands what to expect with his PSA measures. Patient was also educated today about some of the long-term effects from radiation including a small risk for rectal bleeding and possibly erectile dysfunction. We talked about some of the general management approaches to these potential complications. However, I did encourage the patient to contact our office or return at any point if he has questions or concerns related to his previous radiation and prostate cancer.    Anthony Johns, Anthony [name]    Anthony Pita, MD  [facility] Direct Dial: [phone]  Fax: [phone] [email or web address]  Skype  [email or web address]    Page Me   This document serves as a record of services personally performed by Anthony Pita, MD and Freeman Caldron, Anthony [name]. It was created on their behalf by Rae Lips, a trained medical scribe. The creation of this record is based on the scribe's personal observations and the providers' statements to them. This document has been checked and approved by the attending providers.

## 2017-12-10 NOTE — Progress Notes (Signed)
Weight and vitals stable. Denies pain. Pre seed IPSS 28. Post seed IPSS 19. Reports hematuria. Reports intermittent dysuria. Reports blood in stool has resolved. Reports occasional leakage that has improved with Flomax. Scheduled for MRI tonight to confirm SpaceOar placement at 1700. Scheduled for blood work with Dr. Tresa Moore on August 5th and follow up on August 12th.    BP (!) 133/92   Pulse 88   Temp 99.2 F (37.3 C) (Oral)   Resp 18   Wt 209 lb 6.4 oz (95 kg)   SpO2 98%   BMI 26.89 kg/m  Wt Readings from Last 3 Encounters:  12/10/17 209 lb 6.4 oz (95 kg)  11/29/17 206 lb 6.4 oz (93.6 kg)  08/25/17 211 lb 12.8 oz (96.1 kg)

## 2017-12-10 NOTE — Progress Notes (Signed)
  Radiation Oncology         [phone]) [phone] ________________________________  Name: Anthony [name] MRN: [id number]  Date: 12/10/2017  DOB: 1946/06/18  COMPLEX SIMULATION NOTE  NARRATIVE:  The patient was brought to the [facility] today following prostate seed implantation approximately one month ago.  Identity was confirmed.  All relevant records and images related to the planned course of therapy were reviewed.  Then, the patient was set-up supine.  CT images were obtained.  The CT images were loaded into the planning software.  Then the prostate and rectum were contoured.  Treatment planning then occurred.  The implanted iodine 125 seeds were identified by the physics staff for projection of radiation distribution  I have requested : 3D Simulation  I have requested a DVH of the following structures: Prostate and rectum.    ________________________________  Sheral Apley Tammi Klippel, M.D.  This document serves as a record of services personally performed by Tyler Pita, MD. It was created on his behalf by Rae Lips, a trained medical scribe. The creation of this record is based on the scribe's personal observations and the provider's statements to them. This document has been checked and approved by the attending provider.

## 2017-12-16 ENCOUNTER — Encounter: Payer: Self-pay | Admitting: Radiation Oncology

## 2017-12-16 DIAGNOSIS — Z510 Encounter for antineoplastic radiation therapy: Secondary | ICD-10-CM | POA: Diagnosis not present

## 2017-12-16 DIAGNOSIS — C61 Malignant neoplasm of prostate: Secondary | ICD-10-CM | POA: Diagnosis not present

## 2017-12-20 NOTE — Progress Notes (Signed)
  Radiation Oncology         [phone] ________________________________  Name: Issai Werling MRN: [id number]  Date: 12/16/2017  DOB: 17-Mar-1946  3D Planning Note   Prostate Brachytherapy Post-Implant Dosimetry  Diagnosis: 72 y.o. gentleman with Stage T1c adenocarcinoma of the prostate with Gleason Score of 3+4, and PSA of 5.07   Narrative: On a previous date, Gibbs Naugle returned following prostate seed implantation for post implant planning. He underwent CT scan complex simulation to delineate the three-dimensional structures of the pelvis and demonstrate the radiation distribution.  Since that time, the seed localization, and complex isodose planning with dose volume histograms have now been completed.  Results:   Prostate Coverage - The dose of radiation delivered to the 90% or more of the prostate gland (D90) was 106.11% of the prescription dose. This exceeds our goal of greater than 90%. Rectal Sparing - The volume of rectal tissue receiving the prescription dose or higher was 0.0 cc. This falls under our thresholds tolerance of 1.0 cc.  Impression: The prostate seed implant appears to show adequate target coverage and appropriate rectal sparing.  Plan:  The patient will continue to follow with urology for ongoing PSA determinations. I would anticipate a high likelihood for local tumor control with minimal risk for rectal morbidity.  ________________________________  Sheral Apley Tammi Klippel, M.D.

## 2017-12-27 DIAGNOSIS — K529 Noninfective gastroenteritis and colitis, unspecified: Secondary | ICD-10-CM | POA: Diagnosis not present

## 2017-12-27 DIAGNOSIS — C187 Malignant neoplasm of sigmoid colon: Secondary | ICD-10-CM | POA: Diagnosis not present

## 2017-12-27 DIAGNOSIS — Z9049 Acquired absence of other specified parts of digestive tract: Secondary | ICD-10-CM | POA: Diagnosis not present

## 2017-12-27 DIAGNOSIS — K861 Other chronic pancreatitis: Secondary | ICD-10-CM | POA: Diagnosis not present

## 2018-01-25 DIAGNOSIS — Z9641 Presence of insulin pump (external) (internal): Secondary | ICD-10-CM | POA: Diagnosis not present

## 2018-01-25 DIAGNOSIS — E782 Mixed hyperlipidemia: Secondary | ICD-10-CM | POA: Diagnosis not present

## 2018-01-25 DIAGNOSIS — E7800 Pure hypercholesterolemia, unspecified: Secondary | ICD-10-CM | POA: Diagnosis not present

## 2018-01-25 DIAGNOSIS — E1165 Type 2 diabetes mellitus with hyperglycemia: Secondary | ICD-10-CM | POA: Diagnosis not present

## 2018-01-25 DIAGNOSIS — I10 Essential (primary) hypertension: Secondary | ICD-10-CM | POA: Diagnosis not present

## 2018-02-25 ENCOUNTER — Other Ambulatory Visit: Payer: Self-pay | Admitting: Urology

## 2018-02-28 ENCOUNTER — Telehealth: Payer: Self-pay | Admitting: Radiation Oncology

## 2018-02-28 DIAGNOSIS — C61 Malignant neoplasm of prostate: Secondary | ICD-10-CM | POA: Diagnosis not present

## 2018-02-28 NOTE — Telephone Encounter (Signed)
Received voicemail message from patient requesting a return call about his prescriptions.

## 2018-02-28 NOTE — Telephone Encounter (Signed)
Phoned patient back. Explained that Anthony [name] at Dr. Zettie Pho office will work with his nurse to resolve this situation. Offered patient Dr. Zettie Pho office number but he refused stating he doesn't like their phone tree.

## 2018-02-28 NOTE — Telephone Encounter (Signed)
Phoned [facility] and spoke with Mickel Baas. Explained the situation. Mickel Baas confirmed she would reach out to Dr. Zettie Pho nurse for resolution.

## 2018-02-28 NOTE — Telephone Encounter (Signed)
Phoned patient back. Patient requesting a refill of his Flomax. Explained Dr. Alexis Frock of [facility] provided him with a script per Epic record in May with 11 refills. Patient reports the pharmacy has no record of this.

## 2018-02-28 NOTE — Telephone Encounter (Signed)
Phoned pharmacy and spoke with [place]. She confirms they have no record of Dr. Zettie Pho script.

## 2018-03-01 DIAGNOSIS — G609 Hereditary and idiopathic neuropathy, unspecified: Secondary | ICD-10-CM | POA: Diagnosis not present

## 2018-03-01 DIAGNOSIS — E7800 Pure hypercholesterolemia, unspecified: Secondary | ICD-10-CM | POA: Diagnosis not present

## 2018-03-01 DIAGNOSIS — E1165 Type 2 diabetes mellitus with hyperglycemia: Secondary | ICD-10-CM | POA: Diagnosis not present

## 2018-03-07 DIAGNOSIS — C61 Malignant neoplasm of prostate: Secondary | ICD-10-CM | POA: Diagnosis not present

## 2018-03-07 DIAGNOSIS — R3915 Urgency of urination: Secondary | ICD-10-CM | POA: Diagnosis not present

## 2018-03-08 DIAGNOSIS — C61 Malignant neoplasm of prostate: Secondary | ICD-10-CM | POA: Diagnosis not present

## 2018-03-08 DIAGNOSIS — I10 Essential (primary) hypertension: Secondary | ICD-10-CM | POA: Diagnosis not present

## 2018-03-08 DIAGNOSIS — K760 Fatty (change of) liver, not elsewhere classified: Secondary | ICD-10-CM | POA: Diagnosis not present

## 2018-03-08 DIAGNOSIS — G609 Hereditary and idiopathic neuropathy, unspecified: Secondary | ICD-10-CM | POA: Diagnosis not present

## 2018-03-08 DIAGNOSIS — E7800 Pure hypercholesterolemia, unspecified: Secondary | ICD-10-CM | POA: Diagnosis not present

## 2018-03-08 DIAGNOSIS — E1165 Type 2 diabetes mellitus with hyperglycemia: Secondary | ICD-10-CM | POA: Diagnosis not present

## 2018-05-06 DIAGNOSIS — H40023 Open angle with borderline findings, high risk, bilateral: Secondary | ICD-10-CM | POA: Diagnosis not present

## 2018-05-06 DIAGNOSIS — H40033 Anatomical narrow angle, bilateral: Secondary | ICD-10-CM | POA: Diagnosis not present

## 2018-06-28 DIAGNOSIS — I10 Essential (primary) hypertension: Secondary | ICD-10-CM | POA: Diagnosis not present

## 2018-06-28 DIAGNOSIS — Z0000 Encounter for general adult medical examination without abnormal findings: Secondary | ICD-10-CM | POA: Diagnosis not present

## 2018-06-28 DIAGNOSIS — E7800 Pure hypercholesterolemia, unspecified: Secondary | ICD-10-CM | POA: Diagnosis not present

## 2018-06-28 DIAGNOSIS — Z23 Encounter for immunization: Secondary | ICD-10-CM | POA: Diagnosis not present

## 2018-06-28 DIAGNOSIS — E1165 Type 2 diabetes mellitus with hyperglycemia: Secondary | ICD-10-CM | POA: Diagnosis not present

## 2018-07-05 DIAGNOSIS — C187 Malignant neoplasm of sigmoid colon: Secondary | ICD-10-CM | POA: Diagnosis not present

## 2018-07-05 DIAGNOSIS — E782 Mixed hyperlipidemia: Secondary | ICD-10-CM | POA: Diagnosis not present

## 2018-07-05 DIAGNOSIS — R972 Elevated prostate specific antigen [PSA]: Secondary | ICD-10-CM | POA: Diagnosis not present

## 2018-07-05 DIAGNOSIS — E1165 Type 2 diabetes mellitus with hyperglycemia: Secondary | ICD-10-CM | POA: Diagnosis not present

## 2018-07-05 DIAGNOSIS — G609 Hereditary and idiopathic neuropathy, unspecified: Secondary | ICD-10-CM | POA: Diagnosis not present

## 2018-07-05 DIAGNOSIS — I10 Essential (primary) hypertension: Secondary | ICD-10-CM | POA: Diagnosis not present

## 2018-07-05 DIAGNOSIS — R000 Tachycardia, unspecified: Secondary | ICD-10-CM | POA: Diagnosis not present

## 2018-07-05 DIAGNOSIS — K861 Other chronic pancreatitis: Secondary | ICD-10-CM | POA: Diagnosis not present

## 2018-07-05 DIAGNOSIS — C61 Malignant neoplasm of prostate: Secondary | ICD-10-CM | POA: Diagnosis not present

## 2018-08-01 DIAGNOSIS — E782 Mixed hyperlipidemia: Secondary | ICD-10-CM | POA: Diagnosis not present

## 2018-08-01 DIAGNOSIS — G609 Hereditary and idiopathic neuropathy, unspecified: Secondary | ICD-10-CM | POA: Diagnosis not present

## 2018-08-01 DIAGNOSIS — I10 Essential (primary) hypertension: Secondary | ICD-10-CM | POA: Diagnosis not present

## 2018-08-01 DIAGNOSIS — Z9641 Presence of insulin pump (external) (internal): Secondary | ICD-10-CM | POA: Diagnosis not present

## 2018-08-01 DIAGNOSIS — E1165 Type 2 diabetes mellitus with hyperglycemia: Secondary | ICD-10-CM | POA: Diagnosis not present

## 2018-08-24 DIAGNOSIS — K861 Other chronic pancreatitis: Secondary | ICD-10-CM | POA: Diagnosis not present

## 2018-08-24 DIAGNOSIS — C187 Malignant neoplasm of sigmoid colon: Secondary | ICD-10-CM | POA: Diagnosis not present

## 2018-08-24 DIAGNOSIS — Z9049 Acquired absence of other specified parts of digestive tract: Secondary | ICD-10-CM | POA: Diagnosis not present

## 2018-08-24 DIAGNOSIS — K529 Noninfective gastroenteritis and colitis, unspecified: Secondary | ICD-10-CM | POA: Diagnosis not present

## 2018-08-26 DIAGNOSIS — C61 Malignant neoplasm of prostate: Secondary | ICD-10-CM | POA: Diagnosis not present

## 2018-09-01 DIAGNOSIS — C61 Malignant neoplasm of prostate: Secondary | ICD-10-CM | POA: Diagnosis not present

## 2018-09-01 DIAGNOSIS — R3915 Urgency of urination: Secondary | ICD-10-CM | POA: Diagnosis not present

## 2019-01-04 DIAGNOSIS — E1165 Type 2 diabetes mellitus with hyperglycemia: Secondary | ICD-10-CM | POA: Diagnosis not present

## 2019-01-04 DIAGNOSIS — E782 Mixed hyperlipidemia: Secondary | ICD-10-CM | POA: Diagnosis not present

## 2019-01-04 DIAGNOSIS — I10 Essential (primary) hypertension: Secondary | ICD-10-CM | POA: Diagnosis not present

## 2019-01-11 DIAGNOSIS — Z7189 Other specified counseling: Secondary | ICD-10-CM | POA: Diagnosis not present

## 2019-01-11 DIAGNOSIS — K760 Fatty (change of) liver, not elsewhere classified: Secondary | ICD-10-CM | POA: Diagnosis not present

## 2019-01-11 DIAGNOSIS — R945 Abnormal results of liver function studies: Secondary | ICD-10-CM | POA: Diagnosis not present

## 2019-01-11 DIAGNOSIS — R000 Tachycardia, unspecified: Secondary | ICD-10-CM | POA: Diagnosis not present

## 2019-01-11 DIAGNOSIS — Z9889 Other specified postprocedural states: Secondary | ICD-10-CM | POA: Diagnosis not present

## 2019-01-11 DIAGNOSIS — R1011 Right upper quadrant pain: Secondary | ICD-10-CM | POA: Diagnosis not present

## 2019-01-11 DIAGNOSIS — K7290 Hepatic failure, unspecified without coma: Secondary | ICD-10-CM | POA: Diagnosis not present

## 2019-01-11 DIAGNOSIS — K8689 Other specified diseases of pancreas: Secondary | ICD-10-CM | POA: Diagnosis not present

## 2019-01-17 DIAGNOSIS — H353131 Nonexudative age-related macular degeneration, bilateral, early dry stage: Secondary | ICD-10-CM | POA: Diagnosis not present

## 2019-01-17 DIAGNOSIS — H40023 Open angle with borderline findings, high risk, bilateral: Secondary | ICD-10-CM | POA: Diagnosis not present

## 2019-01-17 DIAGNOSIS — E119 Type 2 diabetes mellitus without complications: Secondary | ICD-10-CM | POA: Diagnosis not present

## 2019-01-17 DIAGNOSIS — H40033 Anatomical narrow angle, bilateral: Secondary | ICD-10-CM | POA: Diagnosis not present

## 2019-01-18 DIAGNOSIS — Z7189 Other specified counseling: Secondary | ICD-10-CM | POA: Diagnosis not present

## 2019-01-18 DIAGNOSIS — K760 Fatty (change of) liver, not elsewhere classified: Secondary | ICD-10-CM | POA: Diagnosis not present

## 2019-01-18 DIAGNOSIS — R748 Abnormal levels of other serum enzymes: Secondary | ICD-10-CM | POA: Diagnosis not present

## 2019-01-18 DIAGNOSIS — K7290 Hepatic failure, unspecified without coma: Secondary | ICD-10-CM | POA: Diagnosis not present

## 2019-01-18 DIAGNOSIS — I959 Hypotension, unspecified: Secondary | ICD-10-CM | POA: Diagnosis not present

## 2019-01-25 DIAGNOSIS — Z9641 Presence of insulin pump (external) (internal): Secondary | ICD-10-CM | POA: Diagnosis not present

## 2019-01-25 DIAGNOSIS — E1165 Type 2 diabetes mellitus with hyperglycemia: Secondary | ICD-10-CM | POA: Diagnosis not present

## 2019-02-22 DIAGNOSIS — Z7189 Other specified counseling: Secondary | ICD-10-CM | POA: Diagnosis not present

## 2019-02-22 DIAGNOSIS — K7290 Hepatic failure, unspecified without coma: Secondary | ICD-10-CM | POA: Diagnosis not present

## 2019-02-22 DIAGNOSIS — K760 Fatty (change of) liver, not elsewhere classified: Secondary | ICD-10-CM | POA: Diagnosis not present

## 2019-02-24 DIAGNOSIS — C61 Malignant neoplasm of prostate: Secondary | ICD-10-CM | POA: Diagnosis not present

## 2019-02-28 DIAGNOSIS — C61 Malignant neoplasm of prostate: Secondary | ICD-10-CM | POA: Diagnosis not present

## 2019-02-28 DIAGNOSIS — R3915 Urgency of urination: Secondary | ICD-10-CM | POA: Diagnosis not present

## 2019-03-01 DIAGNOSIS — E782 Mixed hyperlipidemia: Secondary | ICD-10-CM | POA: Diagnosis not present

## 2019-03-01 DIAGNOSIS — E1142 Type 2 diabetes mellitus with diabetic polyneuropathy: Secondary | ICD-10-CM | POA: Diagnosis not present

## 2019-03-01 DIAGNOSIS — Z7189 Other specified counseling: Secondary | ICD-10-CM | POA: Diagnosis not present

## 2019-03-01 DIAGNOSIS — E1165 Type 2 diabetes mellitus with hyperglycemia: Secondary | ICD-10-CM | POA: Diagnosis not present

## 2019-03-01 DIAGNOSIS — K760 Fatty (change of) liver, not elsewhere classified: Secondary | ICD-10-CM | POA: Diagnosis not present

## 2019-03-01 DIAGNOSIS — I10 Essential (primary) hypertension: Secondary | ICD-10-CM | POA: Diagnosis not present

## 2019-03-01 DIAGNOSIS — K861 Other chronic pancreatitis: Secondary | ICD-10-CM | POA: Diagnosis not present

## 2019-04-12 DIAGNOSIS — Z23 Encounter for immunization: Secondary | ICD-10-CM | POA: Diagnosis not present

## 2019-06-12 DIAGNOSIS — Z1159 Encounter for screening for other viral diseases: Secondary | ICD-10-CM | POA: Diagnosis not present

## 2019-06-15 DIAGNOSIS — D123 Benign neoplasm of transverse colon: Secondary | ICD-10-CM | POA: Diagnosis not present

## 2019-06-15 DIAGNOSIS — Z980 Intestinal bypass and anastomosis status: Secondary | ICD-10-CM | POA: Diagnosis not present

## 2019-06-15 DIAGNOSIS — K635 Polyp of colon: Secondary | ICD-10-CM | POA: Diagnosis not present

## 2019-06-15 DIAGNOSIS — K621 Rectal polyp: Secondary | ICD-10-CM | POA: Diagnosis not present

## 2019-06-15 DIAGNOSIS — K648 Other hemorrhoids: Secondary | ICD-10-CM | POA: Diagnosis not present

## 2019-06-15 DIAGNOSIS — Z85038 Personal history of other malignant neoplasm of large intestine: Secondary | ICD-10-CM | POA: Diagnosis not present

## 2019-06-15 DIAGNOSIS — K6289 Other specified diseases of anus and rectum: Secondary | ICD-10-CM | POA: Diagnosis not present

## 2019-06-20 DIAGNOSIS — K635 Polyp of colon: Secondary | ICD-10-CM | POA: Diagnosis not present

## 2019-06-20 DIAGNOSIS — D123 Benign neoplasm of transverse colon: Secondary | ICD-10-CM | POA: Diagnosis not present

## 2019-06-27 DIAGNOSIS — H2511 Age-related nuclear cataract, right eye: Secondary | ICD-10-CM | POA: Diagnosis not present

## 2019-06-27 DIAGNOSIS — H2513 Age-related nuclear cataract, bilateral: Secondary | ICD-10-CM | POA: Diagnosis not present

## 2019-06-27 DIAGNOSIS — E113293 Type 2 diabetes mellitus with mild nonproliferative diabetic retinopathy without macular edema, bilateral: Secondary | ICD-10-CM | POA: Diagnosis not present

## 2019-06-27 DIAGNOSIS — H40023 Open angle with borderline findings, high risk, bilateral: Secondary | ICD-10-CM | POA: Diagnosis not present

## 2019-06-27 DIAGNOSIS — H25013 Cortical age-related cataract, bilateral: Secondary | ICD-10-CM | POA: Diagnosis not present

## 2019-07-05 DIAGNOSIS — K760 Fatty (change of) liver, not elsewhere classified: Secondary | ICD-10-CM | POA: Diagnosis not present

## 2019-07-05 DIAGNOSIS — I10 Essential (primary) hypertension: Secondary | ICD-10-CM | POA: Diagnosis not present

## 2019-07-05 DIAGNOSIS — E1142 Type 2 diabetes mellitus with diabetic polyneuropathy: Secondary | ICD-10-CM | POA: Diagnosis not present

## 2019-07-05 DIAGNOSIS — E1165 Type 2 diabetes mellitus with hyperglycemia: Secondary | ICD-10-CM | POA: Diagnosis not present

## 2019-07-05 DIAGNOSIS — K861 Other chronic pancreatitis: Secondary | ICD-10-CM | POA: Diagnosis not present

## 2019-07-05 DIAGNOSIS — E782 Mixed hyperlipidemia: Secondary | ICD-10-CM | POA: Diagnosis not present

## 2019-07-06 NOTE — Progress Notes (Signed)
[facility] Note  07/11/2019     CHIEF COMPLAINT Patient presents for Retina Evaluation   HISTORY OF PRESENT ILLNESS: Anthony [name] is a 73 y.o. male who presents to the clinic today for:   HPI    Retina Evaluation    In both eyes.  Associated Symptoms Floaters.  Negative for Flashes, Blind Spot, Photophobia, Scalp Tenderness, Fever, Pain, Glare, Jaw Claudication, Weight Loss, Distortion, Redness, Trauma, Shoulder/Hip pain and Fatigue.  Context:  distance vision, mid-range vision and near vision.  Treatments tried include no treatments.  I, the attending physician,  performed the HPI with the patient and updated documentation appropriately.          Comments    Patient referred by Dr. Kathlen Mody for cataract clearance OS. Patient has CE with IOL OD, scheduled tomorrow on 12.16.20. Left eye scheduled for 12.30.20. Patient is diabetic, taking meds for about 15 years. On insulin since 2008. BS was around 300 this am. Pump ran out of insulin. Last a1c was 6.5 to 6.8, about 6 months ago. Patient has chorioretinal scar in OS.        Last edited by Bernarda Caffey, MD on 07/11/2019  9:01 AM. (History)    pt was referred by Dr. Kathlen Mody for cataract clearance OS, pt states his right eye is scheduled for tomorrow morning and left eye is currently scheduled for December 30, pt saw Dr. Zigmund Daniel in 2016 for a choroidal lesion, pt states he had colon cancer at that time and they were worried he had cancer in his eyes as well, pt endorses being diabetic  Referring physician: Hortencia Pilar, MD [place],  [place] [id number]  HISTORICAL INFORMATION:   Selected notes from the MEDICAL RECORD NUMBER Referred by Dr. Kathlen Mody for cat sx clearance Diabetic, ARMD OU, PRF OS, HTN Ret OU Refresh prn OU [facility] [place] OD: 20/50 OS: 20/40-2 MRx OD: +1.25+1.50x105 20/20-         OS: Pl+2.25x80 20/20-   CURRENT MEDICATIONS: Current Outpatient Medications (Ophthalmic  Drugs)  Medication Sig  . carboxymethylcellulose (REFRESH PLUS) 0.5 % SOLN Apply 1 drop to eye daily as needed (FOR EYE IRRITATION).  Marland Kitchen prednisoLONE acetate (PRED FORTE) 1 % ophthalmic suspension Place 1 drop into both eyes 2 (two) times daily.   No current facility-administered medications for this visit. (Ophthalmic Drugs)   Current Outpatient Medications (Other)  Medication Sig  . ACCU-CHEK COMPACT PLUS test strip   . aspirin 325 MG tablet Take 325 mg by mouth 2 (two) times a week. And takes as needed for headaches  . Chlorpheniramine-Phenylephrine (CERON PO) Take by mouth daily. Takes half tablet daily at supper  . fosinopril (MONOPRIL) 10 MG tablet Take 10 mg by mouth daily.  . hydrochlorothiazide (MICROZIDE) 12.5 MG capsule Take 12.5 mg by mouth daily.   . insulin aspart (NOVOLOG) 100 UNIT/ML injection Inject 0.25-4 Units into the skin 3 (three) times daily before meals. Plus after snacks--  Sliding Scale  . pantoprazole (PROTONIX) 40 MG tablet Take 40 mg by mouth every morning.   . tamsulosin (FLOMAX) 0.4 MG CAPS capsule Take 1 capsule (0.4 mg total) by mouth daily as needed. For urinary urgency after prostate radiation.  . traZODone (DESYREL) 50 MG tablet Take 50 mg by mouth at bedtime.  Marland Kitchen amLODipine (NORVASC) 10 MG tablet Take 10 mg by mouth every morning.   . CHOLESTYRAMINE PO Take by mouth once a week.  . colestipol (COLESTID) 1  g tablet   . lisinopril (PRINIVIL,ZESTRIL) 40 MG tablet Take 40 mg by mouth every morning.   . senna-docusate (SENOKOT-S) 8.6-50 MG tablet Take 1 tablet by mouth 2 (two) times daily. While taking strong pain meds to prevent constipation. (Patient not taking: Reported on 07/11/2019)   No current facility-administered medications for this visit. (Other)      REVIEW OF SYSTEMS: ROS    Positive for: Gastrointestinal, Endocrine, Eyes   Negative for: Constitutional, Neurological, Skin, Genitourinary, Musculoskeletal, HENT, Cardiovascular, Respiratory,  Psychiatric, Allergic/Imm, Heme/Lymph   Last edited by Roselee Nova D, COT on 07/11/2019  8:25 AM. (History)       ALLERGIES Allergies  Allergen Reactions  . Other Other (See Comments)    Positive allergy test for peanuts and almonds  . Shellfish Allergy Other (See Comments)    Positive allergy test  . Toradol [Ketorolac Tromethamine] Other (See Comments)    Pt has chronic calcific pancreatitis    PAST MEDICAL HISTORY Past Medical History:  Diagnosis Date  . At risk for sleep apnea    STOP BANG SCORE= 5             SENT TO PCP 11-24-2017  . Chronic calcific pancreatitis ([place]) 2000  . Colon cancer [facility])    09/ 2016  sigmoid cancer  s/p  left hemicolectomy (negative nodes and margins)  . Depression   . GERD (gastroesophageal reflux disease)   . Hemorrhoids   . History of adenomatous polyp of colon   . Hyperlipidemia   . Hyperplasia of prostate with lower urinary tract symptoms (LUTS)   . Hypertension   . Peripheral neuropathy   . Prostate cancer [facility]) urologist-  dr Tresa Moore  oncologist-  dr Tammi Klippel   dx 08-02-2017-- Stage T1c, Gleason, 3+4, PSA 5.07, vol 38cc--  scheduled for radioactive seed implants 11-29-2017  . Type 2 diabetes mellitus with insulin therapy ([place])    Pt reports he is Type 1 Diabetic-diagnosed at age 23/  followed by pcp  . Wears glasses    Past Surgical History:  Procedure Laterality Date  . COLONOSCOPY  last one 03-14-2015  . CYSTOSCOPY N/A 11/29/2017   Procedure: CYSTOSCOPY;  Surgeon: Alexis Frock, MD;  [facility];  Service: Urology;  Laterality: N/A;  no seeds in bladder per Dr Tresa Moore  . EVALUATION UNDER ANESTHESIA WITH HEMORRHOIDECTOMY N/A 05/29/2015   Procedure: ANAL EXAM UNDER ANESTHESIA WITH  HEMORRHOIDOPEXY; RIGID SIGMOIDOSCOPY;  Surgeon: Leighton Ruff, MD;  Location: [facility];  Service: General;  Laterality: N/A;  . LAPAROSCOPIC PARTIAL COLECTOMY N/A 03/29/2015   Procedure: LAPAROSCOPIC SIGMOIDECTOMY;   Surgeon: Leighton Ruff, MD;  Location: [facility];  Service: General;  Laterality: N/A;  . NASAL SEPTUM SURGERY  2006  . PROSTATE BIOPSY  08-02-2017  dr Tresa Moore office  . RADIOACTIVE SEED IMPLANT N/A 11/29/2017   Procedure: RADIOACTIVE SEED IMPLANT/BRACHYTHERAPY IMPLANT;  Surgeon: Alexis Frock, MD;  Location[facility];  Service: Urology;  Laterality: N/A;  . SPACE OAR INSTILLATION N/A 11/29/2017   Procedure: SPACE OAR INSTILLATION;  Surgeon: Alexis Frock, MD;  Location: [facility];  Service: Urology;  Laterality: N/A;    FAMILY HISTORY Family History  Problem Relation Age of Onset  . Hyperlipidemia Father   . Stroke Father   . Prostate cancer Father   . Diabetes Maternal Uncle   . Cancer Brother        unknown blood cancer    SOCIAL HISTORY Social History   Tobacco  Use  . Smoking status: Former Smoker    Years: 30.00    Types: Cigarettes    Quit date: 06/26/2009    Years since quitting: 10.0  . Smokeless tobacco: Never Used  Substance Use Topics  . Alcohol use: Yes    Comment: occasional  . Drug use: No         OPHTHALMIC EXAM:  Base Eye Exam    Visual Acuity (Snellen - Linear)      Right Left   Dist Orient 20/40 +2 20/50 +2   Dist ph [place] 20/30 20/25 -1   Correction: Glasses       Tonometry (Tonopen, 8:41 AM)      Right Left   Pressure 14 16       Pupils      Dark Light Shape React APD   Right 3 2 Round Brisk None   Left 3 2 Round Brisk None       Visual Fields (Counting fingers)      Left Right    Full Full       Extraocular Movement      Right Left    Full, Ortho Full, Ortho       Neuro/Psych    Oriented x3: Yes   Mood/Affect: Normal       Dilation    Both eyes: 1.0% Mydriacyl, 2.5% Phenylephrine @ 8:41 AM        Slit Lamp and Fundus Exam    Slit Lamp Exam      Right Left   Lids/Lashes Dermatochalasis - upper lid, Ptosis Dermatochalasis - upper lid, mild Ptosis   Conjunctiva/Sclera Mild Melanosis, inferior  scleral show Melanosis, temporal pinguecula, inferior scleral show   Cornea Arcus, trace Punctate epithelial erosions Arcus, trace Punctate epithelial erosions   Anterior Chamber deep, narrow angles deep, narrow angles   Iris Round and dilated, No NVI Round and dilated, No NVI   Lens 2-3+ Nuclear sclerosis, 2+ Cortical cataract, +Vacuoles 2-3+ Nuclear sclerosis, 2+ Cortical cataract, +Vacuoles   Vitreous Vitreous syneresis Vitreous syneresis       Fundus Exam      Right Left   Disc Pink and Sharp Pink and Sharp, +cupping   C/D Ratio 0.6 0.65   Macula Blunted foveal reflex, Retinal pigment epithelial mottling, Drusen, No heme or edema Blunted foveal reflex, Retinal pigment epithelial mottling and clumping, Drusen, No heme or edema   Vessels Vascular attenuation, mild Copper wiring, mild Tortuousity Vascular attenuation, mild Tortuousity   Periphery Attached, scattered RPE changes, No heme  Attached, focal patch of CR scarring just outside IT arcades and inferiorly, scattered peripheral drusen, No heme or edema        Refraction    Wearing Rx      Sphere Cylinder   Right +3.00 Sphere   Left +3.25 Sphere   Type: SVL  Uses for reading only       Manifest Refraction      Sphere Cylinder Axis Dist VA   Right +1.25 +1.50 105 20/20-2   Left +0.50 +2.25 080 20/20-1          IMAGING AND PROCEDURES  Imaging and Procedures for _0 @  OCT, Retina - OU - Both Eyes       Right Eye Quality was good. Central Foveal Thickness: 254. Progression has no prior data. Findings include normal foveal contour, no SRF, no IRF, retinal drusen , vitreomacular adhesion  (Mild scattered drusen, Focal ellipsoid disruption nasal macla).   Left Eye  Quality was good. Central Foveal Thickness: 248. Progression has no prior data. Findings include normal foveal contour, no IRF, no SRF, vitreomacular adhesion  (Focal ellipsoid disruption IN and temporal macula, larger geographic area of inner and outer  retinal atrophy IT midzone caught on widefield, rare drusen).   Notes *Images captured and stored on drive  Diagnosis / Impression:  NFP, no IRF/SRF OU +retinal drusen OU -- non-exudative ARMD OD: Focal ellipsoid disruption nasal macla OS: Focal ellipsoid disruption IN and temporal macula, larger geographic area of inner and outer retinal atrophy IT midzone caught on widefield   Clinical management:  See below  Abbreviations: NFP - Normal foveal profile. CME - cystoid macular edema. PED - pigment epithelial detachment. IRF - intraretinal fluid. SRF - subretinal fluid. EZ - ellipsoid zone. ERM - epiretinal membrane. ORA - outer retinal atrophy. ORT - outer retinal tubulation. SRHM - subretinal hyper-reflective material        Fluorescein Angiography Optos (Transit OS)       Right Eye   Progression has no prior data. Early phase findings include staining, window defect. Mid/Late phase findings include staining, window defect (Focal staining / window defect superior and nasal to fovea, ?CNVM superior macula).   Left Eye   Progression has no prior data. Early phase findings include delayed filling, staining, window defect. Mid/Late phase findings include staining, window defect.   Notes **Images stored on drive**  Impression: Scattered staining / window defect corresponding to RPE changes and CR scarring OU (OS > OD) ?CNVM superior macula OD                 ASSESSMENT/PLAN:    ICD-10-CM   1. Chorioretinal scar of left eye  H31.002   2. Early dry stage nonexudative age-related macular degeneration of both eyes  H35.3131   3. Retinal drusen of both eyes  H35.363   4. Retinal edema  H35.81 OCT, Retina - OU - Both Eyes  5. Diabetes mellitus type 2 without retinopathy ([place])  E11.9   6. Essential hypertension  I10   7. Hypertensive retinopathy of both eyes  H35.033 Fluorescein Angiography Optos (Transit OS)  8. Combined forms of age-related cataract of both eyes   H25.813     1 Chorioretinal Scarring OS  - geographic patch of pigmented CR atrophy just outside IT arcades  - scattered patches of outer retinal atrophy  - per records, likely chronic and present on earliest exams in 2014 w/ Dr. Herbert Deaner  - no intervention indicated  - monitor  - clear from a retina standpoint to proceed with cataract surgery when pt and surgeon are ready  2-4. Age related macular degeneration, non-exudative, both eyes  - early dry stage  - The incidence, anatomy, and pathology of dry AMD, risk of progression, and the AREDS and AREDS 2 study including smoking risks discussed with patient.  - Recommend amsler grid monitoring  - f/u 3 mos  5. Diabetes mellitus, type 2 without retinopathy  - The incidence, risk factors for progression, natural history and treatment options for diabetic retinopathy  were discussed with patient.    - The need for close monitoring of blood glucose, blood pressure, and serum lipids, avoiding cigarette or any type of tobacco, and the need for long term follow up was also discussed with patient.  - f/u in 1 year, sooner prn  6,7. Hypertensive retinopathy OU  - discussed importance of tight BP control  - monitor  8. Mixed form age related cataract OU  -  The symptoms of cataract, surgical options, and treatments and risks were discussed with patient.  - discussed diagnosis and progression  - under the expert management of Dr. Kathlen Mody  - clear from a retina standpoint to proceed with cataract surgery when pt and surgeon are ready   Ophthalmic Meds Ordered this visit:  No orders of the defined types were placed in this encounter.      Return in about 3 months (around 10/09/2019) for Dilated Exam, OCT.  There are no Patient Instructions on file for this visit.   Explained the diagnoses, plan, and follow up with the patient and they expressed understanding.  Patient expressed understanding of the importance of proper follow up care.    This document serves as a record of services personally performed by Gardiner Sleeper, MD, PhD. It was created on their behalf by Estill Bakes, COT an ophthalmic technician. The creation of this record is the provider's dictation and/or activities during the visit.    Electronically signed by: Estill Bakes, COT 07/06/19 @ 5:00 PM   This document serves as a record of services personally performed by Gardiner Sleeper, MD, PhD. It was created on their behalf by Ernest Mallick, OA, an ophthalmic assistant. The creation of this record is the provider's dictation and/or activities during the visit.    Electronically signed by: Ernest Mallick, OA 12.15.2020 5:00 PM  Gardiner Sleeper, M.D., Ph.D. Diseases & Surgery of the Retina and Vitreous [facility] 07/11/2019   I have reviewed the above documentation for accuracy and completeness, and I agree with the above. Gardiner Sleeper, M.D., Ph.D. 07/12/19 5:00 PM   Abbreviations: M myopia (nearsighted); A astigmatism; H hyperopia (farsighted); P presbyopia; Mrx spectacle prescription;  CTL contact lenses; OD right eye; OS left eye; OU both eyes  XT exotropia; ET esotropia; PEK punctate epithelial keratitis; PEE punctate epithelial erosions; DES dry eye syndrome; MGD meibomian gland dysfunction; ATs artificial tears; PFAT's preservative free artificial tears; [place] nuclear sclerotic cataract; PSC posterior subcapsular cataract; ERM epi-retinal membrane; PVD posterior vitreous detachment; RD retinal detachment; DM diabetes mellitus; DR diabetic retinopathy; NPDR non-proliferative diabetic retinopathy; PDR proliferative diabetic retinopathy; CSME clinically significant macular edema; DME diabetic macular edema; dbh dot blot hemorrhages; CWS cotton wool spot; POAG primary open angle glaucoma; C/D cup-to-disc ratio; HVF humphrey visual field; GVF goldmann visual field; OCT optical coherence tomography; IOP intraocular pressure; BRVO Branch  retinal vein occlusion; CRVO central retinal vein occlusion; CRAO central retinal artery occlusion; BRAO branch retinal artery occlusion; RT retinal tear; SB scleral buckle; PPV pars plana vitrectomy; VH Vitreous hemorrhage; PRP panretinal laser photocoagulation; IVK intravitreal kenalog; VMT vitreomacular traction; MH Macular hole;  NVD neovascularization of the disc; NVE neovascularization elsewhere; AREDS age related eye disease study; ARMD age related macular degeneration; POAG primary open angle glaucoma; EBMD epithelial/anterior basement membrane dystrophy; ACIOL anterior chamber intraocular lens; IOL intraocular lens; PCIOL posterior chamber intraocular lens; Phaco/IOL phacoemulsification with intraocular lens placement; [place] photorefractive keratectomy; LASIK laser assisted in situ keratomileusis; HTN hypertension; DM diabetes mellitus; COPD chronic obstructive pulmonary disease

## 2019-07-11 ENCOUNTER — Ambulatory Visit (INDEPENDENT_AMBULATORY_CARE_PROVIDER_SITE_OTHER): Payer: Medicare Other | Admitting: Ophthalmology

## 2019-07-11 ENCOUNTER — Other Ambulatory Visit: Payer: Self-pay

## 2019-07-11 ENCOUNTER — Encounter (INDEPENDENT_AMBULATORY_CARE_PROVIDER_SITE_OTHER): Payer: Self-pay | Admitting: Ophthalmology

## 2019-07-11 DIAGNOSIS — E119 Type 2 diabetes mellitus without complications: Secondary | ICD-10-CM

## 2019-07-11 DIAGNOSIS — H35363 Drusen (degenerative) of macula, bilateral: Secondary | ICD-10-CM

## 2019-07-11 DIAGNOSIS — H25813 Combined forms of age-related cataract, bilateral: Secondary | ICD-10-CM

## 2019-07-11 DIAGNOSIS — I10 Essential (primary) hypertension: Secondary | ICD-10-CM

## 2019-07-11 DIAGNOSIS — H353131 Nonexudative age-related macular degeneration, bilateral, early dry stage: Secondary | ICD-10-CM

## 2019-07-11 DIAGNOSIS — H3581 Retinal edema: Secondary | ICD-10-CM | POA: Diagnosis not present

## 2019-07-11 DIAGNOSIS — H35033 Hypertensive retinopathy, bilateral: Secondary | ICD-10-CM | POA: Diagnosis not present

## 2019-07-11 DIAGNOSIS — H31002 Unspecified chorioretinal scars, left eye: Secondary | ICD-10-CM | POA: Diagnosis not present

## 2019-07-26 DIAGNOSIS — H25812 Combined forms of age-related cataract, left eye: Secondary | ICD-10-CM | POA: Diagnosis not present

## 2019-07-31 DIAGNOSIS — E782 Mixed hyperlipidemia: Secondary | ICD-10-CM | POA: Diagnosis not present

## 2019-07-31 DIAGNOSIS — Z9641 Presence of insulin pump (external) (internal): Secondary | ICD-10-CM | POA: Diagnosis not present

## 2019-07-31 DIAGNOSIS — I10 Essential (primary) hypertension: Secondary | ICD-10-CM | POA: Diagnosis not present

## 2019-07-31 DIAGNOSIS — E1165 Type 2 diabetes mellitus with hyperglycemia: Secondary | ICD-10-CM | POA: Diagnosis not present

## 2019-07-31 DIAGNOSIS — G609 Hereditary and idiopathic neuropathy, unspecified: Secondary | ICD-10-CM | POA: Diagnosis not present

## 2019-07-31 DIAGNOSIS — E1142 Type 2 diabetes mellitus with diabetic polyneuropathy: Secondary | ICD-10-CM | POA: Diagnosis not present

## 2019-08-29 DIAGNOSIS — C61 Malignant neoplasm of prostate: Secondary | ICD-10-CM | POA: Diagnosis not present

## 2019-09-05 DIAGNOSIS — R3915 Urgency of urination: Secondary | ICD-10-CM | POA: Diagnosis not present

## 2019-09-05 DIAGNOSIS — C61 Malignant neoplasm of prostate: Secondary | ICD-10-CM | POA: Diagnosis not present

## 2019-09-05 DIAGNOSIS — D1723 Benign lipomatous neoplasm of skin and subcutaneous tissue of right leg: Secondary | ICD-10-CM | POA: Diagnosis not present

## 2019-09-21 DIAGNOSIS — Z23 Encounter for immunization: Secondary | ICD-10-CM | POA: Diagnosis not present

## 2019-10-03 NOTE — Progress Notes (Signed)
[facility] Note  10/09/2019     CHIEF COMPLAINT Patient presents for Retina Follow Up   HISTORY OF PRESENT ILLNESS: Anthony [name] is a 74 y.o. male who presents to the clinic today for:   HPI    Retina Follow Up    Patient presents with  Dry AMD.  In both eyes.  This started 3 months ago.  Severity is moderate.  I, the attending physician,  performed the HPI with the patient and updated documentation appropriately.          Comments    Patient here for 3 months retina follow up for CR scar SO/ ARMD OU. Patient states vision doing fine. Had cataract surgery OU in Dec. Pseudo OD Dec 14th and Pseudo OS Dec 28th. Has allergies eye are itching uses allergy drops. Started 1 week ago.       Last edited by Bernarda Caffey, MD on 10/09/2019  4:25 PM. (History)    Patient states had CE with IOL OD on 12.14.20 and OS on 12.28.20 with Dr. Kathlen Mody. Vision has improved OU since cataract surgery. Eyes itch OU--using allergy gtts for itching.   Referring physician: Hortencia Pilar, MD [place],  [place] [address]  HISTORICAL INFORMATION:   Selected notes from the MEDICAL RECORD NUMBER Referred by Dr. Kathlen Mody for cat sx clearance Diabetic, ARMD OU, PRF OS, HTN Ret OU Refresh prn OU [facility] OD: 20/50 OS: 20/40-2 MRx OD: +1.25+1.50x105 20/20-         OS: Pl+2.25x80 20/20-   CURRENT MEDICATIONS: Current Outpatient Medications (Ophthalmic Drugs)  Medication Sig  . carboxymethylcellulose (REFRESH PLUS) 0.5 % SOLN Apply 1 drop to eye daily as needed (FOR EYE IRRITATION).  Marland Kitchen prednisoLONE acetate (PRED FORTE) 1 % ophthalmic suspension Place 1 drop into both eyes 2 (two) times daily.   No current facility-administered medications for this visit. (Ophthalmic Drugs)   Current Outpatient Medications (Other)  Medication Sig  . ACCU-CHEK COMPACT PLUS test strip   . amLODipine (NORVASC) 10 MG tablet Take 10 mg by mouth every morning.   Marland Kitchen aspirin 325 MG  tablet Take 325 mg by mouth 2 (two) times a week. And takes as needed for headaches  . Chlorpheniramine-Phenylephrine (CERON PO) Take by mouth daily. Takes half tablet daily at supper  . CHOLESTYRAMINE PO Take by mouth once a week.  . colestipol (COLESTID) 1 g tablet   . fosinopril (MONOPRIL) 10 MG tablet Take 10 mg by mouth daily.  . hydrochlorothiazide (MICROZIDE) 12.5 MG capsule Take 12.5 mg by mouth daily.   . insulin aspart (NOVOLOG) 100 UNIT/ML injection Inject 0.25-4 Units into the skin 3 (three) times daily before meals. Plus after snacks--  Sliding Scale  . lisinopril (PRINIVIL,ZESTRIL) 40 MG tablet Take 40 mg by mouth every morning.   . pantoprazole (PROTONIX) 40 MG tablet Take 40 mg by mouth every morning.   . senna-docusate (SENOKOT-S) 8.6-50 MG tablet Take 1 tablet by mouth 2 (two) times daily. While taking strong pain meds to prevent constipation. (Patient not taking: Reported on 07/11/2019)  . tamsulosin (FLOMAX) 0.4 MG CAPS capsule Take 1 capsule (0.4 mg total) by mouth daily as needed. For urinary urgency after prostate radiation.  . traZODone (DESYREL) 50 MG tablet Take 50 mg by mouth at bedtime.   No current facility-administered medications for this visit. (Other)      REVIEW OF SYSTEMS: ROS    Positive for: Gastrointestinal, Endocrine, Eyes  Negative for: Constitutional, Neurological, Skin, Genitourinary, Musculoskeletal, HENT, Cardiovascular, Respiratory, Psychiatric, Allergic/Imm, Heme/Lymph   Last edited by Theodore Demark, COA on 10/09/2019  9:48 AM. (History)       ALLERGIES Allergies  Allergen Reactions  . Other Other (See Comments)    Positive allergy test for peanuts and almonds  . Shellfish Allergy Other (See Comments)    Positive allergy test  . Toradol [Ketorolac Tromethamine] Other (See Comments)    Pt has chronic calcific pancreatitis    PAST MEDICAL HISTORY Past Medical History:  Diagnosis Date  . At risk for sleep apnea    STOP BANG  SCORE= 5             SENT TO PCP 11-24-2017  . Chronic calcific pancreatitis ([place]) 2000  . Colon cancer [facility])    09/ 2016  sigmoid cancer  s/p  left hemicolectomy (negative nodes and margins)  . Depression   . GERD (gastroesophageal reflux disease)   . Hemorrhoids   . History of adenomatous polyp of colon   . Hyperlipidemia   . Hyperplasia of prostate with lower urinary tract symptoms (LUTS)   . Hypertension   . Peripheral neuropathy   . Prostate cancer [facility]) urologist-  dr Tresa Moore  oncologist-  dr Tammi Klippel   dx 08-02-2017-- Stage T1c, Gleason, 3+4, PSA 5.07, vol 38cc--  scheduled for radioactive seed implants 11-29-2017  . Type 2 diabetes mellitus with insulin therapy ([place])    Pt reports he is Type 1 Diabetic-diagnosed at age 63/  followed by pcp  . Wears glasses    Past Surgical History:  Procedure Laterality Date  . COLONOSCOPY  last one 03-14-2015  . CYSTOSCOPY N/A 11/29/2017   Procedure: CYSTOSCOPY;  Surgeon: Alexis Frock, MD;  Location: [facility];  Service: Urology;  Laterality: N/A;  no seeds in bladder per Dr Tresa Moore  . EVALUATION UNDER ANESTHESIA WITH HEMORRHOIDECTOMY N/A 05/29/2015   Procedure: ANAL EXAM UNDER ANESTHESIA WITH  HEMORRHOIDOPEXY; RIGID SIGMOIDOSCOPY;  Surgeon: Leighton Ruff, MD;  Location: [place];  Service: General;  Laterality: N/A;  . LAPAROSCOPIC PARTIAL COLECTOMY N/A 03/29/2015   Procedure: LAPAROSCOPIC SIGMOIDECTOMY;  Surgeon: Leighton Ruff, MD;  Location: [facility];  Service: General;  Laterality: N/A;  . NASAL SEPTUM SURGERY  2006  . PROSTATE BIOPSY  08-02-2017  dr Tresa Moore office  . RADIOACTIVE SEED IMPLANT N/A 11/29/2017   Procedure: RADIOACTIVE SEED IMPLANT/BRACHYTHERAPY IMPLANT;  Surgeon: Alexis Frock, MD;  Location: [facility];  Service: Urology;  Laterality: N/A;  . SPACE OAR INSTILLATION N/A 11/29/2017   Procedure: SPACE OAR INSTILLATION;  Surgeon: Alexis Frock, MD;  Location: [facility];   Service: Urology;  Laterality: N/A;    FAMILY HISTORY Family History  Problem Relation Age of Onset  . Hyperlipidemia Father   . Stroke Father   . Prostate cancer Father   . Diabetes Maternal Uncle   . Cancer Brother        unknown blood cancer    SOCIAL HISTORY Social History   Tobacco Use  . Smoking status: Former Smoker    Years: 30.00    Types: Cigarettes    Quit date: 06/26/2009    Years since quitting: 10.2  . Smokeless tobacco: Never Used  Substance Use Topics  . Alcohol use: Yes    Comment: occasional  . Drug use: No         OPHTHALMIC EXAM:  Base Eye Exam    Visual Acuity (Snellen - Linear)  Right Left   Dist Simms 20/20 -2 20/20       Tonometry (Tonopen, 9:45 AM)      Right Left   Pressure 14 11       Pupils      Dark Light Shape React APD   Right 2 1 Round Brisk None   Left 2 1 Round Brisk None       Visual Fields (Counting fingers)      Left Right    Full Full       Extraocular Movement      Right Left    Full, Ortho Full, Ortho       Neuro/Psych    Oriented x3: Yes   Mood/Affect: Normal       Dilation    Both eyes: 1.0% Mydriacyl, 2.5% Phenylephrine @ 9:45 AM        Slit Lamp and Fundus Exam    Slit Lamp Exam      Right Left   Lids/Lashes Dermatochalasis - upper lid, Ptosis Dermatochalasis - upper lid, mild Ptosis   Conjunctiva/Sclera Mild Melanosis, inferior scleral show Melanosis, temporal pinguecula, inferior scleral show   Cornea Arcus, trace Punctate epithelial erosions, well healed cataract wounds Arcus, trace Punctate epithelial erosions, well healed cataract wounds   Anterior Chamber deep and clear deep and clear   Iris Round and moderately Round and moderately dilated   Lens PC IOL in excellent position PC IOL in excellent poistion   Vitreous Vitreous syneresis Vitreous syneresis       Fundus Exam      Right Left   Disc Pink and Sharp Pink and Sharp, +cupping and central pallor   C/D Ratio 0.6 0.65   Macula  Blunted foveal reflex, Retinal pigment epithelial mottling, Drusen, No heme or edema Blunted foveal reflex, Retinal pigment epithelial mottling and clumping, Drusen, No heme or edema, RPE atrophy and mild ERM   Vessels Vascular attenuation, mild Copper wiring, mild Tortuousity Vascular attenuation, mild Tortuousity   Periphery Attached, scattered RPE changes and drusen, No heme  Attached, focal patch of CR scarring just outside IT arcades and inferiorly--stable, scattered peripheral drusen, No heme or edema          IMAGING AND PROCEDURES  Imaging and Procedures for '@TODAY' @  OCT, Retina - OU - Both Eyes       Right Eye Quality was good. Central Foveal Thickness: 262. Progression has worsened. Findings include no IRF, retinal drusen , vitreomacular adhesion , normal foveal contour, subretinal fluid, pigment epithelial detachment (focal patch of SRF superior to fovea, scattered small PED's,  Mild scattered drusen, Focal ellipsoid disruption nasal macla--stable, ).   Left Eye Quality was good. Central Foveal Thickness: 253. Progression has worsened. Findings include normal foveal contour, no IRF, no SRF, vitreomacular adhesion , pigment epithelial detachment (Focal ellipsoid disruption IN and temporal macula, larger geographic area of inner and outer retinal atrophy IT midzone caught on widefield, rare drusen, focal low lying PED temporal macula).   Notes *Images captured and stored on drive  Diagnosis / Impression:  NFP, no IRF OU +retinal drusen OU -- non-exudative ARMD OD: new focal patch of SRF superior to fovea and scattered small PED's; focal ellipsoid disruption nasal macla--stable OS: Focal ellipsoid disruption IN and temporal macula, larger geographic area of inner and outer retinal atrophy IT midzone caught on widefield, new focal low lying PED temporal macula  Clinical management:  See below  Abbreviations: NFP - Normal foveal profile. CME - cystoid macular  edema. PED -  pigment epithelial detachment. IRF - intraretinal fluid. SRF - subretinal fluid. EZ - ellipsoid zone. ERM - epiretinal membrane. ORA - outer retinal atrophy. ORT - outer retinal tubulation. SRHM - subretinal hyper-reflective material                 ASSESSMENT/PLAN:    ICD-10-CM   1. Chorioretinal scar of left eye  H31.002   2. Early dry stage nonexudative age-related macular degeneration of both eyes  H35.3131   3. Retinal drusen of both eyes  H35.363   4. Retinal edema  H35.81 OCT, Retina - OU - Both Eyes  5. Diabetes mellitus type 2 without retinopathy ([place])  E11.9   6. Essential hypertension  I10   7. Hypertensive retinopathy of both eyes  H35.033   8. Pseudophakia of both eyes  Z96.1     1 Chorioretinal Scarring OS -- stable  - geographic patch of pigmented CR atrophy just outside IT arcades  - scattered patches of outer retinal atrophy  - per records, likely chronic and present on earliest exams in 2014 w/ Dr. Herbert Deaner  - no intervention indicated  - monitor  - f/u 3 months  2-4. Age related macular degeneration, non-exudative, both eyes  - early dry stage  - BCVA improved post cataract surgery OU to 20/20 OU  - OCT shows interval development of focal SRF OD, scattered small PEDs OU  - The incidence, anatomy, and pathology of dry AMD, risk of progression, and the AREDS and AREDS 2 study including smoking risks discussed with patient.  - continue amsler grid monitoring  - f/u 3 mos, sooner prn  5. Diabetes mellitus, type 2 without retinopathy  - The incidence, risk factors for progression, natural history and treatment options for diabetic retinopathy  were discussed with patient.    - The need for close monitoring of blood glucose, blood pressure, and serum lipids, avoiding cigarette or any type of tobacco, and the need for long term follow up was also discussed with patient.  - f/u in 1 year, sooner prn  6,7. Hypertensive retinopathy OU  - discussed importance of  tight BP control  - monitor  8. Pseudophakia OU  - s/p CEIOL OD on 12.14.20 and OS on 12.28.20 with Dr. Kathlen Mody.  - beautiful surgeries w/ uncorrected [facility] 20/20 OU  - pt extremely happy   Ophthalmic Meds Ordered this visit:  No orders of the defined types were placed in this encounter.      Return in about 3 months (around 01/09/2020) for DFE, OCT.  There are no Patient Instructions on file for this visit.   Explained the diagnoses, plan, and follow up with the patient and they expressed understanding.  Patient expressed understanding of the importance of proper follow up care.   This document serves as a record of services personally performed by Gardiner Sleeper, MD, PhD. It was created on their behalf by Roselee Nova, COMT. The creation of this record is the provider's dictation and/or activities during the visit.  Electronically signed by: Roselee Nova, COMT 10/09/19 4:31 PM   Gardiner Sleeper, M.D., Ph.D. Diseases & Surgery of the Retina and Vitreous [facility]  I have reviewed the above documentation for accuracy and completeness, and I agree with the above. Gardiner Sleeper, M.D., Ph.D. 10/09/19 4:31 PM   Abbreviations: M myopia (nearsighted); A astigmatism; H hyperopia (farsighted); P presbyopia; Mrx spectacle prescription;  CTL contact lenses; OD right eye; OS  left eye; OU both eyes  XT exotropia; ET esotropia; PEK punctate epithelial keratitis; PEE punctate epithelial erosions; DES dry eye syndrome; MGD meibomian gland dysfunction; ATs artificial tears; PFAT's preservative free artificial tears; [place] nuclear sclerotic cataract; PSC posterior subcapsular cataract; ERM epi-retinal membrane; PVD posterior vitreous detachment; RD retinal detachment; DM diabetes mellitus; DR diabetic retinopathy; NPDR non-proliferative diabetic retinopathy; PDR proliferative diabetic retinopathy; CSME clinically significant macular edema; DME diabetic macular edema; dbh dot  blot hemorrhages; CWS cotton wool spot; POAG primary open angle glaucoma; C/D cup-to-disc ratio; HVF humphrey visual field; GVF goldmann visual field; OCT optical coherence tomography; IOP intraocular pressure; BRVO Branch retinal vein occlusion; CRVO central retinal vein occlusion; CRAO central retinal artery occlusion; BRAO branch retinal artery occlusion; RT retinal tear; SB scleral buckle; PPV pars plana vitrectomy; VH Vitreous hemorrhage; PRP panretinal laser photocoagulation; IVK intravitreal kenalog; VMT vitreomacular traction; MH Macular hole;  NVD neovascularization of the disc; NVE neovascularization elsewhere; AREDS age related eye disease study; ARMD age related macular degeneration; POAG primary open angle glaucoma; EBMD epithelial/anterior basement membrane dystrophy; ACIOL anterior chamber intraocular lens; IOL intraocular lens; PCIOL posterior chamber intraocular lens; Phaco/IOL phacoemulsification with intraocular lens placement; [place] photorefractive keratectomy; LASIK laser assisted in situ keratomileusis; HTN hypertension; DM diabetes mellitus; COPD chronic obstructive pulmonary disease

## 2019-10-09 ENCOUNTER — Ambulatory Visit (INDEPENDENT_AMBULATORY_CARE_PROVIDER_SITE_OTHER): Payer: Medicare Other | Admitting: Ophthalmology

## 2019-10-09 ENCOUNTER — Other Ambulatory Visit: Payer: Self-pay

## 2019-10-09 ENCOUNTER — Encounter (INDEPENDENT_AMBULATORY_CARE_PROVIDER_SITE_OTHER): Payer: Self-pay | Admitting: Ophthalmology

## 2019-10-09 DIAGNOSIS — H35363 Drusen (degenerative) of macula, bilateral: Secondary | ICD-10-CM | POA: Diagnosis not present

## 2019-10-09 DIAGNOSIS — I10 Essential (primary) hypertension: Secondary | ICD-10-CM | POA: Diagnosis not present

## 2019-10-09 DIAGNOSIS — H353131 Nonexudative age-related macular degeneration, bilateral, early dry stage: Secondary | ICD-10-CM | POA: Diagnosis not present

## 2019-10-09 DIAGNOSIS — H35033 Hypertensive retinopathy, bilateral: Secondary | ICD-10-CM

## 2019-10-09 DIAGNOSIS — H31002 Unspecified chorioretinal scars, left eye: Secondary | ICD-10-CM | POA: Diagnosis not present

## 2019-10-09 DIAGNOSIS — H3581 Retinal edema: Secondary | ICD-10-CM

## 2019-10-09 DIAGNOSIS — Z961 Presence of intraocular lens: Secondary | ICD-10-CM | POA: Diagnosis not present

## 2019-10-09 DIAGNOSIS — E119 Type 2 diabetes mellitus without complications: Secondary | ICD-10-CM

## 2019-10-19 DIAGNOSIS — Z23 Encounter for immunization: Secondary | ICD-10-CM | POA: Diagnosis not present

## 2019-10-30 DIAGNOSIS — E782 Mixed hyperlipidemia: Secondary | ICD-10-CM | POA: Diagnosis not present

## 2019-10-30 DIAGNOSIS — E1165 Type 2 diabetes mellitus with hyperglycemia: Secondary | ICD-10-CM | POA: Diagnosis not present

## 2019-10-30 DIAGNOSIS — Z9641 Presence of insulin pump (external) (internal): Secondary | ICD-10-CM | POA: Diagnosis not present

## 2019-10-30 DIAGNOSIS — G609 Hereditary and idiopathic neuropathy, unspecified: Secondary | ICD-10-CM | POA: Diagnosis not present

## 2019-10-30 DIAGNOSIS — I10 Essential (primary) hypertension: Secondary | ICD-10-CM | POA: Diagnosis not present

## 2019-11-21 DIAGNOSIS — H35033 Hypertensive retinopathy, bilateral: Secondary | ICD-10-CM | POA: Diagnosis not present

## 2019-11-21 DIAGNOSIS — H40023 Open angle with borderline findings, high risk, bilateral: Secondary | ICD-10-CM | POA: Diagnosis not present

## 2019-11-22 DIAGNOSIS — E782 Mixed hyperlipidemia: Secondary | ICD-10-CM | POA: Diagnosis not present

## 2019-11-22 DIAGNOSIS — E1142 Type 2 diabetes mellitus with diabetic polyneuropathy: Secondary | ICD-10-CM | POA: Diagnosis not present

## 2019-11-22 DIAGNOSIS — E1165 Type 2 diabetes mellitus with hyperglycemia: Secondary | ICD-10-CM | POA: Diagnosis not present

## 2019-11-22 DIAGNOSIS — I10 Essential (primary) hypertension: Secondary | ICD-10-CM | POA: Diagnosis not present

## 2019-11-29 DIAGNOSIS — K760 Fatty (change of) liver, not elsewhere classified: Secondary | ICD-10-CM | POA: Diagnosis not present

## 2019-11-29 DIAGNOSIS — E1165 Type 2 diabetes mellitus with hyperglycemia: Secondary | ICD-10-CM | POA: Diagnosis not present

## 2019-11-29 DIAGNOSIS — G629 Polyneuropathy, unspecified: Secondary | ICD-10-CM | POA: Diagnosis not present

## 2019-11-29 DIAGNOSIS — E1142 Type 2 diabetes mellitus with diabetic polyneuropathy: Secondary | ICD-10-CM | POA: Diagnosis not present

## 2019-11-29 DIAGNOSIS — E782 Mixed hyperlipidemia: Secondary | ICD-10-CM | POA: Diagnosis not present

## 2019-11-29 DIAGNOSIS — D1723 Benign lipomatous neoplasm of skin and subcutaneous tissue of right leg: Secondary | ICD-10-CM | POA: Diagnosis not present

## 2019-11-29 DIAGNOSIS — I10 Essential (primary) hypertension: Secondary | ICD-10-CM | POA: Diagnosis not present

## 2019-12-14 DIAGNOSIS — K760 Fatty (change of) liver, not elsewhere classified: Secondary | ICD-10-CM | POA: Diagnosis not present

## 2019-12-14 DIAGNOSIS — I10 Essential (primary) hypertension: Secondary | ICD-10-CM | POA: Diagnosis not present

## 2019-12-14 DIAGNOSIS — G629 Polyneuropathy, unspecified: Secondary | ICD-10-CM | POA: Diagnosis not present

## 2019-12-14 DIAGNOSIS — E782 Mixed hyperlipidemia: Secondary | ICD-10-CM | POA: Diagnosis not present

## 2019-12-14 DIAGNOSIS — E1165 Type 2 diabetes mellitus with hyperglycemia: Secondary | ICD-10-CM | POA: Diagnosis not present

## 2019-12-14 DIAGNOSIS — D1723 Benign lipomatous neoplasm of skin and subcutaneous tissue of right leg: Secondary | ICD-10-CM | POA: Diagnosis not present

## 2020-01-04 DIAGNOSIS — E782 Mixed hyperlipidemia: Secondary | ICD-10-CM | POA: Diagnosis not present

## 2020-01-04 DIAGNOSIS — K719 Toxic liver disease, unspecified: Secondary | ICD-10-CM | POA: Diagnosis not present

## 2020-01-15 NOTE — Progress Notes (Signed)
[facility] Note  01/16/2020     CHIEF COMPLAINT Patient presents for Retina Follow Up   HISTORY OF PRESENT ILLNESS: Anthony [name] is a 74 y.o. male who presents to the clinic today for:   HPI    Retina Follow Up    Patient presents with  Other.  In left eye.  This started 3 months ago.  Severity is moderate.  I, the attending physician,  performed the HPI with the patient and updated documentation appropriately.          Comments    Patient here for 3 months retina follow up for DFE OS. Patient states vision doing the same. No eye pain. Just dry eyes.       Last edited by Bernarda Caffey, MD on 01/18/2020  8:16 AM. (History)    Patient states had CE with IOL OD on 12.14.20 and OS on 12.28.20 with Dr. Kathlen Mody. Vision has improved OU since cataract surgery. Eyes itch OU--using allergy gtts for itching.   Referring physician: Merrilee Seashore, [place],   [id number]  HISTORICAL INFORMATION:   Selected notes from the MEDICAL RECORD NUMBER Referred by Dr. Kathlen Mody for cat sx clearance   CURRENT MEDICATIONS: Current Outpatient Medications (Ophthalmic Drugs)  Medication Sig  . carboxymethylcellulose (REFRESH PLUS) 0.5 % SOLN Apply 1 drop to eye daily as needed (FOR EYE IRRITATION).  Marland Kitchen prednisoLONE acetate (PRED FORTE) 1 % ophthalmic suspension Place 1 drop into both eyes 2 (two) times daily.   No current facility-administered medications for this visit. (Ophthalmic Drugs)   Current Outpatient Medications (Other)  Medication Sig  . ACCU-CHEK COMPACT PLUS test strip   . amLODipine (NORVASC) 10 MG tablet Take 10 mg by mouth every morning.   Marland Kitchen aspirin 325 MG tablet Take 325 mg by mouth 2 (two) times a week. And takes as needed for headaches  . Chlorpheniramine-Phenylephrine (CERON PO) Take by mouth daily. Takes half tablet daily at supper  . CHOLESTYRAMINE PO Take by mouth once a week.  . colestipol (COLESTID) 1 g  tablet   . fosinopril (MONOPRIL) 10 MG tablet Take 10 mg by mouth daily.  . hydrochlorothiazide (MICROZIDE) 12.5 MG capsule Take 12.5 mg by mouth daily.   . insulin aspart (NOVOLOG) 100 UNIT/ML injection Inject 0.25-4 Units into the skin 3 (three) times daily before meals. Plus after snacks--  Sliding Scale  . lisinopril (PRINIVIL,ZESTRIL) 40 MG tablet Take 40 mg by mouth every morning.   . pantoprazole (PROTONIX) 40 MG tablet Take 40 mg by mouth every morning.   . senna-docusate (SENOKOT-S) 8.6-50 MG tablet Take 1 tablet by mouth 2 (two) times daily. While taking strong pain meds to prevent constipation. (Patient not taking: Reported on 07/11/2019)  . tamsulosin (FLOMAX) 0.4 MG CAPS capsule Take 1 capsule (0.4 mg total) by mouth daily as needed. For urinary urgency after prostate radiation.  . traZODone (DESYREL) 50 MG tablet Take 50 mg by mouth at bedtime.   No current facility-administered medications for this visit. (Other)      REVIEW OF SYSTEMS: ROS    Positive for: Gastrointestinal, Endocrine, Eyes   Negative for: Constitutional, Neurological, Skin, Genitourinary, Musculoskeletal, HENT, Cardiovascular, Respiratory, Psychiatric, Allergic/Imm, Heme/Lymph   Last edited by Theodore Demark, COA on 01/16/2020  9:54 AM. (History)       ALLERGIES Allergies  Allergen Reactions  . Other Other (See Comments)    Positive allergy test for peanuts and almonds  .  Shellfish Allergy Other (See Comments)    Positive allergy test  . Toradol [Ketorolac Tromethamine] Other (See Comments)    Pt has chronic calcific pancreatitis    PAST MEDICAL HISTORY Past Medical History:  Diagnosis Date  . At risk for sleep apnea    STOP BANG SCORE= 5             SENT TO PCP 11-24-2017  . Chronic calcific pancreatitis ([place]) 2000  . Colon cancer [facility])    09/ 2016  sigmoid cancer  s/p  left hemicolectomy (negative nodes and margins)  . Depression   . GERD (gastroesophageal reflux disease)   .  Hemorrhoids   . History of adenomatous polyp of colon   . Hyperlipidemia   . Hyperplasia of prostate with lower urinary tract symptoms (LUTS)   . Hypertension   . Peripheral neuropathy   . Prostate cancer [facility]) urologist-  dr Tresa Moore  oncologist-  dr Tammi Klippel   dx 08-02-2017-- Stage T1c, Gleason, 3+4, PSA 5.07, vol 38cc--  scheduled for radioactive seed implants 11-29-2017  . Type 2 diabetes mellitus with insulin therapy ([place])    Pt reports he is Type 1 Diabetic-diagnosed at age 74/  followed by pcp  . Wears glasses    Past Surgical History:  Procedure Laterality Date  . COLONOSCOPY  last one 03-14-2015  . CYSTOSCOPY N/A 11/29/2017   Procedure: CYSTOSCOPY;  Surgeon: Alexis Frock, MD;  Location: [facility];  Service: Urology;  Laterality: N/A;  no seeds in bladder per Dr Tresa Moore  . EVALUATION UNDER ANESTHESIA WITH HEMORRHOIDECTOMY N/A 05/29/2015   Procedure: ANAL EXAM UNDER ANESTHESIA WITH  HEMORRHOIDOPEXY; RIGID SIGMOIDOSCOPY;  Surgeon: Leighton Ruff, MD;  Location: [place];  Service: General;  Laterality: N/A;  . LAPAROSCOPIC PARTIAL COLECTOMY N/A 03/29/2015   Procedure: LAPAROSCOPIC SIGMOIDECTOMY;  Surgeon: Leighton Ruff, MD;  Location: [facility];  Service: General;  Laterality: N/A;  . NASAL SEPTUM SURGERY  2006  . PROSTATE BIOPSY  08-02-2017  dr Tresa Moore office  . RADIOACTIVE SEED IMPLANT N/A 11/29/2017   Procedure: RADIOACTIVE SEED IMPLANT/BRACHYTHERAPY IMPLANT;  Surgeon: Alexis Frock, MD;  Location: [facility];  Service: Urology;  Laterality: N/A;  . SPACE OAR INSTILLATION N/A 11/29/2017   Procedure: SPACE OAR INSTILLATION;  Surgeon: Alexis Frock, MD;  Location: [facility];  Service: Urology;  Laterality: N/A;    FAMILY HISTORY Family History  Problem Relation Age of Onset  . Hyperlipidemia Father   . Stroke Father   . Prostate cancer Father   . Diabetes Maternal Uncle   . Cancer Brother        unknown blood cancer     SOCIAL HISTORY Social History   Tobacco Use  . Smoking status: Former Smoker    Years: 30.00    Types: Cigarettes    Quit date: 06/26/2009    Years since quitting: 10.5  . Smokeless tobacco: Never Used  Vaping Use  . Vaping Use: Never used  Substance Use Topics  . Alcohol use: Yes    Comment: occasional  . Drug use: No         OPHTHALMIC EXAM:  Base Eye Exam    Visual Acuity (Snellen - Linear)      Right Left   Dist [place] 20/20 20/20       Tonometry (Tonopen, 9:51 AM)      Right Left   Pressure 17 12       Pupils      Dark  Light Shape React APD   Right 2 1 Round Brisk None   Left 2 1 Round Brisk None       Visual Fields (Counting fingers)      Left Right    Full Full       Extraocular Movement      Right Left    Full, Ortho Full, Ortho       Neuro/Psych    Oriented x3: Yes   Mood/Affect: Normal       Dilation    Both eyes: 1.0% Mydriacyl, 2.5% Phenylephrine @ 9:51 AM        Slit Lamp and Fundus Exam    Slit Lamp Exam      Right Left   Lids/Lashes Dermatochalasis - upper lid, Ptosis Dermatochalasis - upper lid, mild Ptosis   Conjunctiva/Sclera Mild Melanosis, inferior scleral show Melanosis, temporal pinguecula, inferior scleral show   Cornea Arcus, trace Punctate epithelial erosions, well healed cataract wounds Arcus, trace Punctate epithelial erosions, well healed cataract wounds   Anterior Chamber deep and clear deep and clear   Iris Round and moderately Round and moderately dilated   Lens PC IOL in excellent position PC IOL in excellent poistion   Vitreous Vitreous syneresis Vitreous syneresis       Fundus Exam      Right Left   Disc Pink and Sharp Pink and Sharp, +cupping and central pallor   C/D Ratio 0.6 0.65   Macula Blunted foveal reflex, Retinal pigment epithelial mottling, Drusen, No heme.  Focal shallow SRF superior macula. Blunted foveal reflex, Retinal pigment epithelial mottling and clumping, Drusen, No heme or edema, RPE  atrophy and mild ERM   Vessels Vascular attenuation, mild Copper wiring, mild Tortuousity Vascular attenuation, mild Tortuousity   Periphery Attached, scattered RPE changes and drusen, No heme  Attached, focal patch of CR scarring just outside IT arcades and inferiorly--stable, scattered peripheral drusen, No heme or edema        Refraction    Wearing Rx      Sphere Cylinder   Right +3.00 Sphere   Left +3.25 Sphere   Type: SVL          IMAGING AND PROCEDURES  Imaging and Procedures for @TODAY @  OCT, Retina - OU - Both Eyes       Right Eye Quality was good. Central Foveal Thickness: 263. Progression has worsened. Findings include no IRF, retinal drusen , vitreomacular adhesion , normal foveal contour, subretinal fluid, pigment epithelial detachment (focal patch of SRF superior to fovea--slightly increased, scattered small PED's,  Mild scattered drusen, Focal ellipsoid disruption nasal macula--stable).   Left Eye Quality was good. Central Foveal Thickness: 261. Progression has been stable. Findings include normal foveal contour, no IRF, no SRF, vitreomacular adhesion , pigment epithelial detachment, outer retinal atrophy (Focal ellipsoid disruption IN and temporal macula, larger geographic area of inner and outer retinal atrophy IT midzone caught on widefield, rare drusen, focal low lying PED temporal macula).   Notes *Images captured and stored on drive  Diagnosis / Impression:  NFP, no IRF OU +retinal drusen OU -- non-exudative ARMD OD: focal patch of SRF superior to fovea--slightly increased, scattered small PED's,  Mild scattered drusen, Focal ellipsoid disruption nasal macula--stable OS: Focal ellipsoid disruption IN and temporal macula, larger geographic area of inner and outer retinal atrophy IT midzone caught on widefield, new focal low lying PED temporal macula  Clinical management:  See below  Abbreviations: NFP - Normal foveal profile. CME - cystoid  macular edema.  PED - pigment epithelial detachment. IRF - intraretinal fluid. SRF - subretinal fluid. EZ - ellipsoid zone. ERM - epiretinal membrane. ORA - outer retinal atrophy. ORT - outer retinal tubulation. SRHM - subretinal hyper-reflective material                 ASSESSMENT/PLAN:    ICD-10-CM   1. Chorioretinal scar of left eye  H31.002   2. Early dry stage nonexudative age-related macular degeneration of both eyes  H35.3131   3. Retinal drusen of both eyes  H35.363   4. Retinal edema  H35.81 OCT, Retina - OU - Both Eyes  5. Diabetes mellitus type 2 without retinopathy ([place])  E11.9   6. Essential hypertension  I10   7. Hypertensive retinopathy of both eyes  H35.033   8. Pseudophakia of both eyes  Z96.1     1 Chorioretinal Scarring OS -- stable  - geographic patch of pigmented CR atrophy just outside IT arcades  - scattered patches of outer retinal atrophy  - per records, likely chronic and present on earliest exams in 2014 w/ Dr. Herbert Deaner  - no intervention indicated  - monitor  - f/u 3 months  2-4. Age related macular degeneration, non-exudative, both eyes  - early dry stage  - BCVA improved post cataract surgery OU to 20/20 OU  - OCT shows interval increase in focal SRF OD, scattered small PEDs OU -- ?CSCR component  - The incidence, anatomy, and pathology of dry AMD, risk of progression, and the AREDS and AREDS 2 study including smoking risks discussed with patient.  - continue amsler grid monitoring  - f/u 3-4 mos, sooner prn for DFE/OCT/Optos FA-Transit OD  5. Diabetes mellitus, type 2 without retinopathy  - The incidence, risk factors for progression, natural history and treatment options for diabetic retinopathy  were discussed with patient.    - The need for close monitoring of blood glucose, blood pressure, and serum lipids, avoiding cigarette or any type of tobacco, and the need for long term follow up was also discussed with patient.  - f/u in 1 year, sooner prn  6,7.  Hypertensive retinopathy OU  - discussed importance of tight BP control  - monitor  8. Pseudophakia OU  - s/p CEIOL OD on 12.14.20 and OS on 12.28.20 with Dr. Kathlen Mody.  - beautiful surgeries w/ uncorrected VA 20/20 OU  - pt extremely happy   Ophthalmic Meds Ordered this visit:  No orders of the defined types were placed in this encounter.      Return for 3-4 mo f/u for non-exu ARMD OU w/DFE/OCT/Optos FA-Transit OD.  There are no Patient Instructions on file for this visit.   Explained the diagnoses, plan, and follow up with the patient and they expressed understanding.  Patient expressed understanding of the importance of proper follow up care.   This document serves as a record of services personally performed by Gardiner Sleeper, MD, PhD. It was created on their behalf by Estill Bakes, COT an ophthalmic technician. The creation of this record is the provider's dictation and/or activities during the visit.    Electronically signed by: Estill Bakes, COT 01/15/20 @ 10:38 AM  Gardiner Sleeper, M.D., Ph.D. Diseases & Surgery of the Retina and [place] 01/16/2020   I have reviewed the above documentation for accuracy and completeness, and I agree with the above. Gardiner Sleeper, M.D., Ph.D. 01/19/20 10:38 AM   Abbreviations: M myopia (nearsighted); A  astigmatism; H hyperopia (farsighted); P presbyopia; Mrx spectacle prescription;  CTL contact lenses; OD right eye; OS left eye; OU both eyes  XT exotropia; ET esotropia; PEK punctate epithelial keratitis; PEE punctate epithelial erosions; DES dry eye syndrome; MGD meibomian gland dysfunction; ATs artificial tears; PFAT's preservative free artificial tears; [place] nuclear sclerotic cataract; PSC posterior subcapsular cataract; ERM epi-retinal membrane; PVD posterior vitreous detachment; RD retinal detachment; DM diabetes mellitus; DR diabetic retinopathy; NPDR non-proliferative diabetic retinopathy; PDR  proliferative diabetic retinopathy; CSME clinically significant macular edema; DME diabetic macular edema; dbh dot blot hemorrhages; CWS cotton wool spot; POAG primary open angle glaucoma; C/D cup-to-disc ratio; HVF humphrey visual field; GVF goldmann visual field; OCT optical coherence tomography; IOP intraocular pressure; BRVO Branch retinal vein occlusion; CRVO central retinal vein occlusion; CRAO central retinal artery occlusion; BRAO branch retinal artery occlusion; RT retinal tear; SB scleral buckle; PPV pars plana vitrectomy; VH Vitreous hemorrhage; PRP panretinal laser photocoagulation; IVK intravitreal kenalog; VMT vitreomacular traction; MH Macular hole;  NVD neovascularization of the disc; NVE neovascularization elsewhere; AREDS age related eye disease study; ARMD age related macular degeneration; POAG primary open angle glaucoma; EBMD epithelial/anterior basement membrane dystrophy; ACIOL anterior chamber intraocular lens; IOL intraocular lens; PCIOL posterior chamber intraocular lens; Phaco/IOL phacoemulsification with intraocular lens placement; [place] photorefractive keratectomy; LASIK laser assisted in situ keratomileusis; HTN hypertension; DM diabetes mellitus; COPD chronic obstructive pulmonary disease

## 2020-01-16 ENCOUNTER — Ambulatory Visit (INDEPENDENT_AMBULATORY_CARE_PROVIDER_SITE_OTHER): Payer: Medicare Other | Admitting: Ophthalmology

## 2020-01-16 ENCOUNTER — Other Ambulatory Visit: Payer: Self-pay

## 2020-01-16 ENCOUNTER — Encounter (INDEPENDENT_AMBULATORY_CARE_PROVIDER_SITE_OTHER): Payer: Self-pay | Admitting: Ophthalmology

## 2020-01-16 DIAGNOSIS — H35363 Drusen (degenerative) of macula, bilateral: Secondary | ICD-10-CM

## 2020-01-16 DIAGNOSIS — H353131 Nonexudative age-related macular degeneration, bilateral, early dry stage: Secondary | ICD-10-CM

## 2020-01-16 DIAGNOSIS — H31002 Unspecified chorioretinal scars, left eye: Secondary | ICD-10-CM | POA: Diagnosis not present

## 2020-01-16 DIAGNOSIS — H3581 Retinal edema: Secondary | ICD-10-CM

## 2020-01-16 DIAGNOSIS — Z961 Presence of intraocular lens: Secondary | ICD-10-CM

## 2020-01-16 DIAGNOSIS — H35033 Hypertensive retinopathy, bilateral: Secondary | ICD-10-CM

## 2020-01-16 DIAGNOSIS — E119 Type 2 diabetes mellitus without complications: Secondary | ICD-10-CM

## 2020-01-16 DIAGNOSIS — I10 Essential (primary) hypertension: Secondary | ICD-10-CM

## 2020-01-18 ENCOUNTER — Encounter (INDEPENDENT_AMBULATORY_CARE_PROVIDER_SITE_OTHER): Payer: Self-pay | Admitting: Ophthalmology

## 2020-01-22 ENCOUNTER — Other Ambulatory Visit: Payer: Self-pay | Admitting: Surgery

## 2020-01-22 DIAGNOSIS — R1909 Other intra-abdominal and pelvic swelling, mass and lump: Secondary | ICD-10-CM

## 2020-02-08 ENCOUNTER — Encounter: Payer: Self-pay | Admitting: *Deleted

## 2020-02-12 ENCOUNTER — Encounter: Payer: Self-pay | Admitting: Neurology

## 2020-02-12 ENCOUNTER — Ambulatory Visit (INDEPENDENT_AMBULATORY_CARE_PROVIDER_SITE_OTHER): Payer: Medicare Other | Admitting: Neurology

## 2020-02-12 ENCOUNTER — Other Ambulatory Visit: Payer: Self-pay | Admitting: *Deleted

## 2020-02-12 ENCOUNTER — Other Ambulatory Visit: Payer: Self-pay

## 2020-02-12 VITALS — BP 132/82 | HR 88 | Ht 74.0 in | Wt 218.0 lb

## 2020-02-12 DIAGNOSIS — R202 Paresthesia of skin: Secondary | ICD-10-CM

## 2020-02-12 DIAGNOSIS — M6281 Muscle weakness (generalized): Secondary | ICD-10-CM | POA: Diagnosis not present

## 2020-02-12 MED ORDER — ALPRAZOLAM 1 MG PO TABS: ORAL_TABLET | ORAL | 0 refills | Status: DC

## 2020-02-12 NOTE — Progress Notes (Signed)
HISTORICAL  Anthony [name] is a 74 year old male, seen in request by his primary care physician Dr. Merrilee Seashore, for evaluation of bilateral upper extremity paresthesia, initial evaluation was on February 12, 2020,  I reviewed and summarized the referring note.  Past medical history -Hypertension, -Diabetes since 1990s, insulin-dependent since 2008,  -Anxiety, chronic insomnia, trazodone 50mg  qhs -Colon Cancer surgery in 2016, no chemo/radiation therapy -Abnormal LFTs-? statin treatment, off statin now, followed up by. Merrilee Seashore, MD  CT of abdomen with contrast in June 2020, marked hepatic steatosis, atrophic pancreas with multiple calcification, consistent with chronic pancreatitis, postsurgical change of the sigmoid colon, radioactive seeds in the prostate gland,Lumbar degenerative changes, partially imaging homogeneous ovoid mass in the subcutaneous soft tissue often anteromedial right upper thigh, consistent with a benign lesion such as seroma or lymphocele  Around April 2021, he noticed numbness involving left arm, also noticed left medial elbow sensitivity, radiating discomfort, later he noticed left hand muscle atrophy, weakness, difficulty making a tight grip, in June 2021, he noticed similar but milder involvement of right hand, right arm,  He had a history of diabetes peripheral neuropathy for more than 10 years, describes spongy sensation at the bottom of his feet, lower extremity paresthesia stated below he denies gait abnormality, he denies significant low back pain, denies bowel and bladder incontinence.   REVIEW OF SYSTEMS: Full 14 system review of systems performed and notable only for as above All other review of systems were negative.  ALLERGIES: Allergies  Allergen Reactions  . Atorvastatin Other (See Comments)    Reports elevated liver enzymes.  . Other Other (See Comments)    Positive allergy test for peanuts and almonds  . Shellfish Allergy Other (See  Comments)    Positive allergy test  . Toradol [Ketorolac Tromethamine] Other (See Comments)    Pt has chronic calcific pancreatitis  . Adhesive [Tape] Rash    Tegaderm     HOME MEDICATIONS: Current Outpatient Medications  Medication Sig Dispense Refill  . ACCU-CHEK COMPACT PLUS test strip   5  . amLODipine (NORVASC) 10 MG tablet Take 10 mg by mouth every morning.     Marland Kitchen aspirin 325 MG tablet Take 325 mg by mouth 2 (two) times a week. And takes as needed for headaches    . carboxymethylcellulose (REFRESH PLUS) 0.5 % SOLN Apply 1 drop to eye daily as needed (FOR EYE IRRITATION).    Marland Kitchen celecoxib (CELEBREX) 200 MG capsule Take 200 mg by mouth daily.    . fosinopril (MONOPRIL) 10 MG tablet Take 10 mg by mouth daily.    . hydrochlorothiazide (MICROZIDE) 12.5 MG capsule Take 12.5 mg by mouth daily.     . insulin aspart (NOVOLOG) 100 UNIT/ML injection Inject 0.25-4 Units into the skin 3 (three) times daily before meals. Plus after snacks--  Sliding Scale    . pantoprazole (PROTONIX) 40 MG tablet Take 40 mg by mouth every morning.     . tamsulosin (FLOMAX) 0.4 MG CAPS capsule Take 1 capsule (0.4 mg total) by mouth daily as needed. For urinary urgency after prostate radiation. 30 capsule 11  . traZODone (DESYREL) 50 MG tablet Take 50 mg by mouth at bedtime.     No current facility-administered medications for this visit.    PAST MEDICAL HISTORY: Past Medical History:  Diagnosis Date  . Agent orange exposure   . Anxiety   . At risk for sleep apnea    STOP BANG SCORE= 5  SENT TO PCP 11-24-2017  . Chronic calcific pancreatitis ([place]) 2000  . Colon cancer [facility])    09/ 2016  sigmoid cancer  s/p  left hemicolectomy (negative nodes and margins)  . Depression   . GERD (gastroesophageal reflux disease)   . Hemorrhoids   . History of adenomatous polyp of colon   . Hyperlipidemia   . Hyperplasia of prostate with lower urinary tract symptoms (LUTS)   . Hypertension   . Lipoma   .  Peripheral neuropathy   . Prostate cancer [facility]) urologist-  dr Tresa Moore  oncologist-  dr Tammi Klippel   dx 08-02-2017-- Stage T1c, Gleason, 3+4, PSA 5.07, vol 38cc--  scheduled for radioactive seed implants 11-29-2017  . Type 2 diabetes mellitus with insulin therapy ([place])    Pt reports he is Type 1 Diabetic-diagnosed at age 54/  followed by pcp  . Wears glasses     PAST SURGICAL HISTORY: Past Surgical History:  Procedure Laterality Date  . COLONOSCOPY  last one 03-14-2015  . CYSTOSCOPY N/A 11/29/2017   Procedure: CYSTOSCOPY;  Surgeon: Alexis Frock, MD;  Location: [facility];  Service: Urology;  Laterality: N/A;  no seeds in bladder per Dr Tresa Moore  . EVALUATION UNDER ANESTHESIA WITH HEMORRHOIDECTOMY N/A 05/29/2015   Procedure: ANAL EXAM UNDER ANESTHESIA WITH  HEMORRHOIDOPEXY; RIGID SIGMOIDOSCOPY;  Surgeon: Leighton Ruff, MD;  Location: [place];  Service: General;  Laterality: N/A;  . LAPAROSCOPIC PARTIAL COLECTOMY N/A 03/29/2015   Procedure: LAPAROSCOPIC SIGMOIDECTOMY;  Surgeon: Leighton Ruff, MD;  Location: [facility];  Service: General;  Laterality: N/A;  . NASAL SEPTUM SURGERY  2006  . PROSTATE BIOPSY  08-02-2017  dr Tresa Moore office  . RADIOACTIVE SEED IMPLANT N/A 11/29/2017   Procedure: RADIOACTIVE SEED IMPLANT/BRACHYTHERAPY IMPLANT;  Surgeon: Alexis Frock, MD;  Location: [facility];  Service: Urology;  Laterality: N/A;  . SPACE OAR INSTILLATION N/A 11/29/2017   Procedure: SPACE OAR INSTILLATION;  Surgeon: Alexis Frock, MD;  Location: [facility];  Service: Urology;  Laterality: N/A;    FAMILY HISTORY: Family History  Problem Relation Age of Onset  . Hyperlipidemia Father   . Stroke Father   . Prostate cancer Father   . Heart attack Father   . Diabetes Maternal Uncle   . Cancer Brother        unknown blood cancer  . Healthy Mother     SOCIAL HISTORY: Social History   Socioeconomic History  . Marital status: Married     Spouse name: Not on file  . Number of children: 2  . Years of education: college  . Highest education level: Not on file  Occupational History  . Occupation: Retired  Tobacco Use  . Smoking status: Former Smoker    Years: 30.00    Types: Cigarettes    Quit date: 06/26/2009    Years since quitting: 10.6  . Smokeless tobacco: Never Used  Vaping Use  . Vaping Use: Never used  Substance and Sexual Activity  . Alcohol use: Not Currently  . Drug use: No  . Sexual activity: Not Currently  Other Topics Concern  . Not on file  Social History Narrative   Resides in [place], [place]. Lives with his wife. Believes he was exposed to agent orange in the [place] War.    Right-handed.   No daily caffeine use.   Social Determinants of Health   Financial Resource Strain:   . Difficulty of Paying Living Expenses:   Food Insecurity:   .  Worried About Charity fundraiser in the Last Year:   . Arboriculturist in the Last Year:   Transportation Needs:   . Film/video editor (Medical):   Marland Kitchen Lack of Transportation (Non-Medical):   Physical Activity:   . Days of Exercise per Week:   . Minutes of Exercise per Session:   Stress:   . Feeling of Stress :   Social Connections:   . Frequency of Communication with Friends and Family:   . Frequency of Social Gatherings with Friends and Family:   . Attends Religious Services:   . Active Member of Clubs or Organizations:   . Attends Archivist Meetings:   Marland Kitchen Marital Status:   Intimate Partner Violence:   . Fear of Current or Ex-Partner:   . Emotionally Abused:   Marland Kitchen Physically Abused:   . Sexually Abused:      PHYSICAL EXAM   Vitals:   02/12/20 1325  BP: 132/82  Pulse: 88  Weight: 218 lb (98.9 kg)  Height: 6\' 2"  (1.88 m)   Not recorded     Body mass index is 27.99 kg/m.  PHYSICAL EXAMNIATION:  Gen: NAD, conversant, well nourised, well groomed                     Cardiovascular: Regular rate rhythm, no peripheral edema, warm,  nontender. Eyes: Conjunctivae clear without exudates or hemorrhage Neck: Supple, no carotid bruits. Pulmonary: Clear to auscultation bilaterally   NEUROLOGICAL EXAM:  MENTAL STATUS: Speech:    Speech is normal; fluent and spontaneous with normal comprehension.  Cognition:     Orientation to time, place and person     Normal recent and remote memory     Normal Attention span and concentration     Normal Language, naming, repeating,spontaneous speech     Fund of knowledge   CRANIAL NERVES: CN II: Visual fields are full to confrontation. Pupils are round equal and briskly reactive to light. CN III, IV, VI: extraocular movement are normal. No ptosis. CN V: Facial sensation is intact to light touch CN VII: Face is symmetric with normal eye closure  CN VIII: Hearing is normal to causal conversation. CN IX, X: Phonation is normal. CN XI: Head turning and shoulder shrug are intact  MOTOR: Moderate atrophy of left hand intrinsic muscles, no upper extremity proximal muscle weakness, mild to moderate left handgrip weakness, no significant right hand weakness, bilateral lower extremity motor strength was normal, left elbow Tinel's sign was present.  REFLEXES: Reflexes are 1 and symmetric at the biceps, triceps, knees, and absent ankles. Plantar responses are flexor.  SENSORY: Length dependent decreased light touch, pinprick, vibratory sensation to distal shin level  COORDINATION: There is no trunk or limb dysmetria noted.  GAIT/STANCE: Posture is normal. Gait is steady with normal steps, base, arm swing, and turning. Heel and toe walking are normal. Tandem gait is normal.  Romberg is absent.   DIAGNOSTIC DATA (LABS, IMAGING, TESTING) - I reviewed patient records, labs, notes, testing and imaging myself where available.   ASSESSMENT AND PLAN  Giuseppe Duchemin is a 74 y.o. male   Bilateral upper extremity paresthesia, left hand significant intrinsic muscle atrophy weakness., Evidence  of diabetic peripheral neuropathy  Differentiation diagnosis includes cervical radiculopathy  Versus bilateral upper extremity neuropathy, such as bilateral ulnar neuropathy, median neuropathy,  EMG nerve conduction study  MRI of cervical spine   Marcial Pacas, M.D. Ph.D.  [facility] [address], [address]  [place], [place] [address] Ph: [phone] Fax: [phone]  CC:  Merrilee Seashore, [address] [place],  [place] [phone]

## 2020-02-13 MED ORDER — ALPRAZOLAM 1 MG PO TABS: ORAL_TABLET | ORAL | 0 refills | Status: DC

## 2020-02-21 ENCOUNTER — Telehealth: Payer: Self-pay | Admitting: Neurology

## 2020-02-21 NOTE — Telephone Encounter (Signed)
medicare/umr Josem Kaufmann: [place] Ref # [id number] order sent to GI. They will reach out to the patient to schedule.

## 2020-02-26 ENCOUNTER — Ambulatory Visit
Admission: RE | Admit: 2020-02-26 | Discharge: 2020-02-26 | Disposition: A | Discharge status: Home / Self Care | Payer: Medicare Other | Source: Ambulatory Visit | Attending: Surgery | Admitting: Surgery

## 2020-02-26 ENCOUNTER — Other Ambulatory Visit: Payer: Self-pay

## 2020-02-26 ENCOUNTER — Other Ambulatory Visit: Payer: Medicare Other

## 2020-02-26 DIAGNOSIS — Z8546 Personal history of malignant neoplasm of prostate: Secondary | ICD-10-CM | POA: Diagnosis not present

## 2020-02-26 DIAGNOSIS — R2241 Localized swelling, mass and lump, right lower limb: Secondary | ICD-10-CM | POA: Diagnosis not present

## 2020-02-26 DIAGNOSIS — R1909 Other intra-abdominal and pelvic swelling, mass and lump: Secondary | ICD-10-CM | POA: Diagnosis not present

## 2020-02-26 DIAGNOSIS — Z85038 Personal history of other malignant neoplasm of large intestine: Secondary | ICD-10-CM | POA: Diagnosis not present

## 2020-02-26 IMAGING — MR MR PELVIS WO/W CM
5 of 9 series · 24 of 48 positions shown · IV contrast (multihance)
Comparison: CT chest, abdomen and pelvis [DATE].

CLINICAL DATA: Right groin swelling for 4 months. The patient has a
history of prostate and colon cancer.

EXAM:
MRI PELVIS WITHOUT AND WITH CONTRAST
TECHNIQUE: Multiplanar multisequence MR imaging of the pelvis was performed
both before and after administration of intravenous contrast.
CONTRAST:  20 mL MULTIHANCE GADOBENATE DIMEGLUMINE 529 MG/ML IV SOLN

[Series 3: T1 · axial · 6.0mm · 0.50mm/px · z∈[-99,+72]mm · 5 of 23 slices shown (1 of 2)]
[im 1/23]
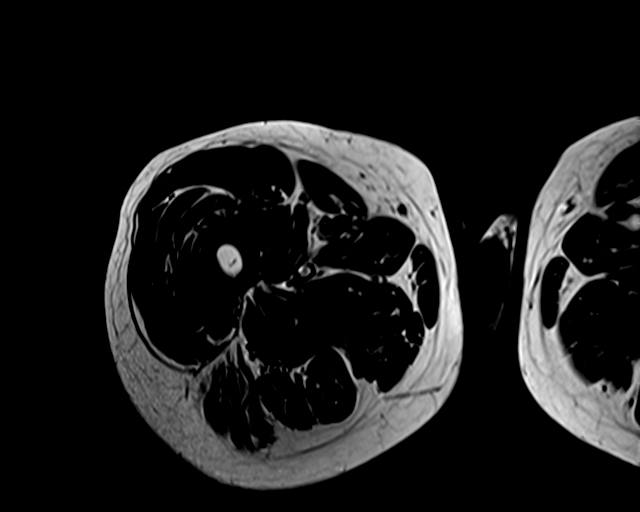
[im 6/23]
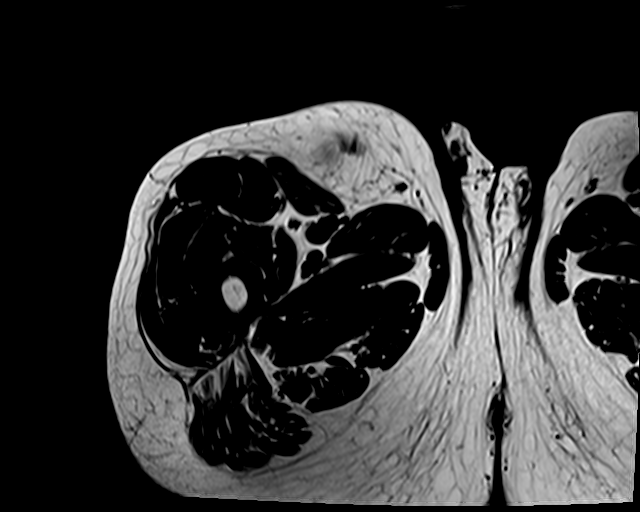
[im 12/23]
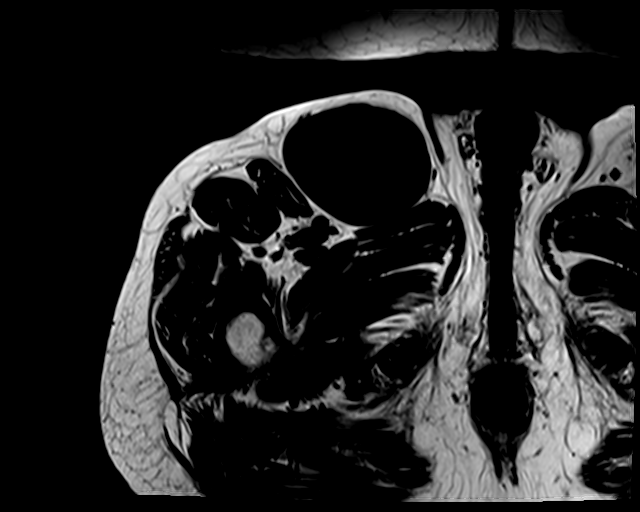
[im 17/23]
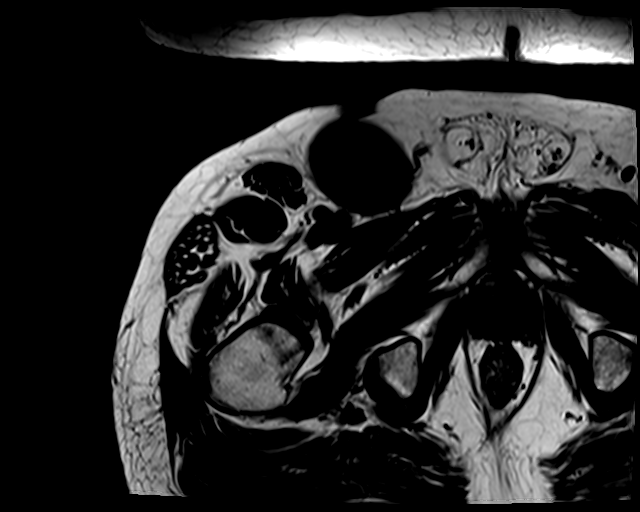
[im 23/23]
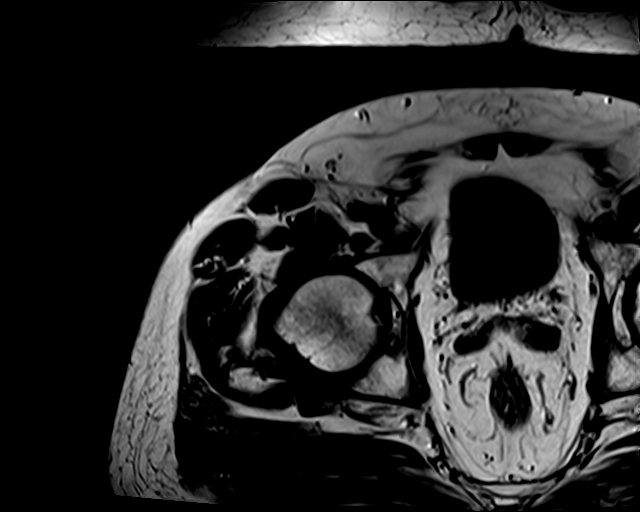

[Series 4: T2 fat-sat · axial · 6.0mm · 0.94mm/px · z∈[-100,+72]mm · 5 of 23 slices shown (1 of 2)]
[im 1/23]
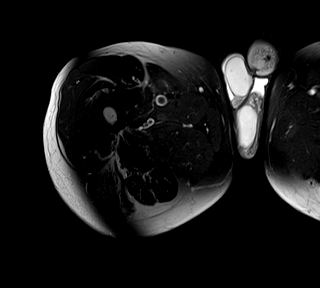
[im 6/23]
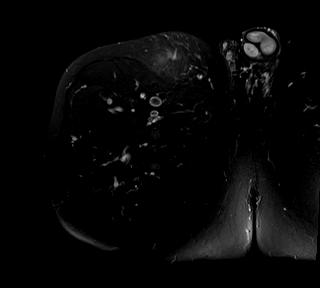
[im 12/23]
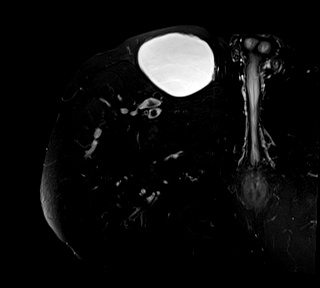
[im 17/23]
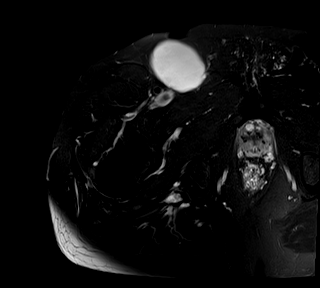
[im 23/23]
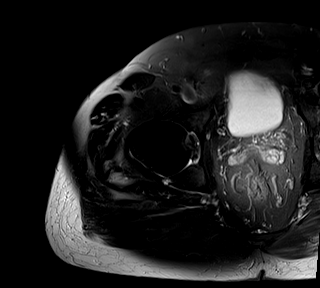

[Series 5: T1 · coronal · 5.0mm · 0.47mm/px · 5 of 20 slices shown (2 of 2)]
[im 1/20]
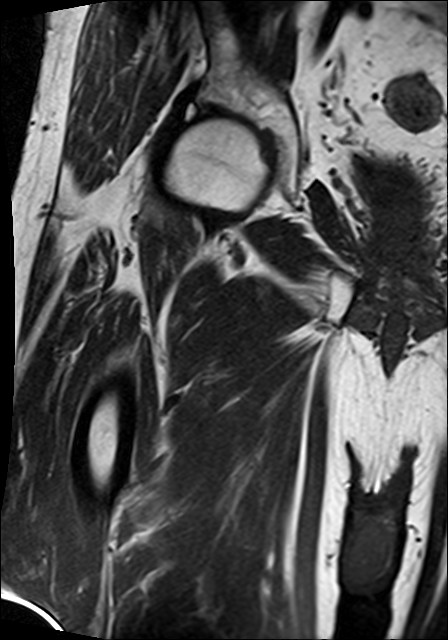
[im 5/20]
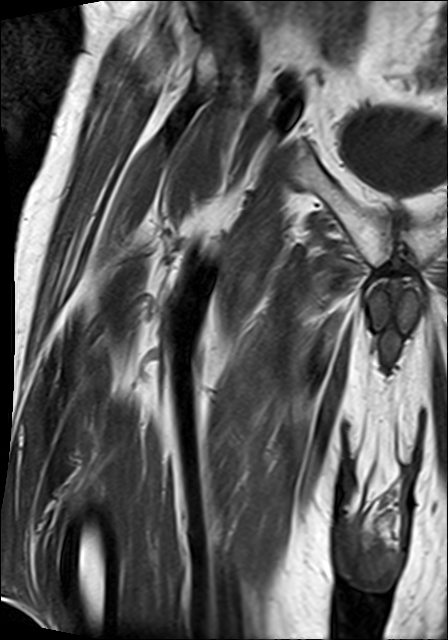
[im 10/20]
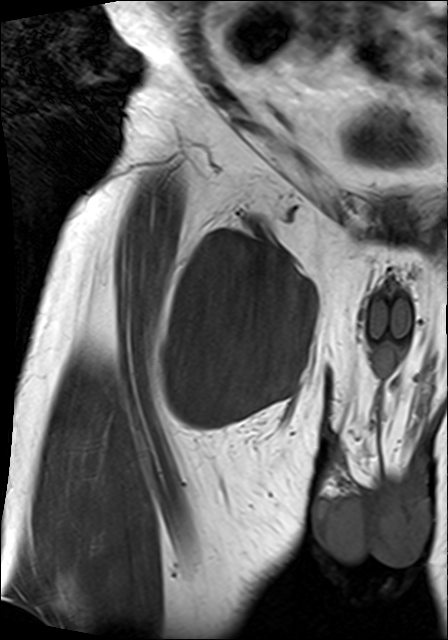
[im 15/20]
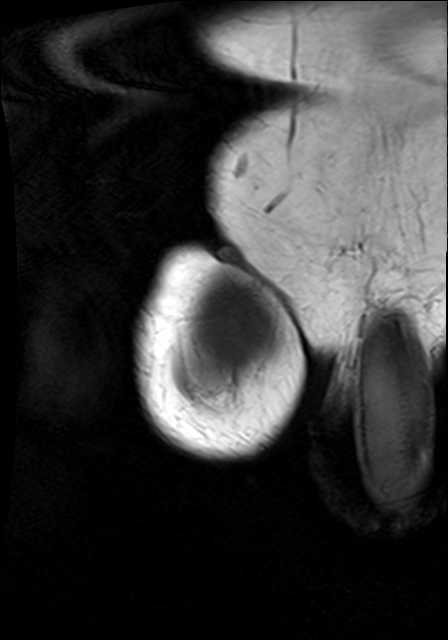
[im 20/20]
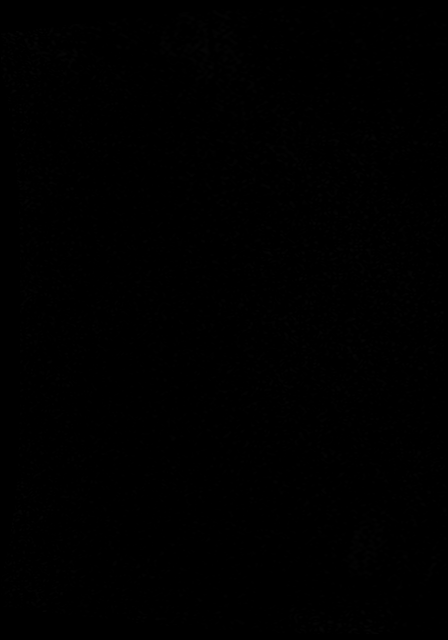

[Series 6: T2 fat-sat · coronal · 5.0mm · 0.59mm/px · 5 of 20 slices shown (2 of 2)]
[im 1/20]
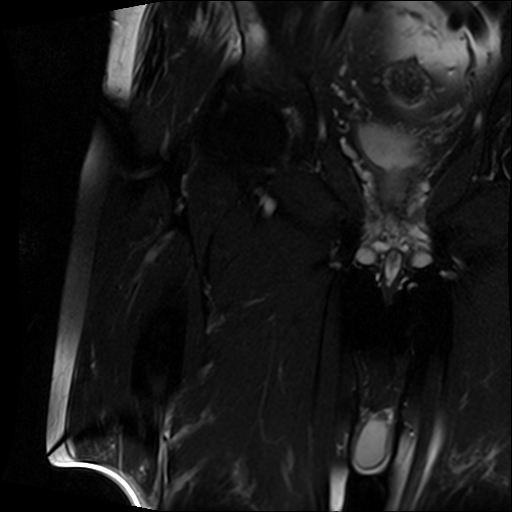
[im 5/20]
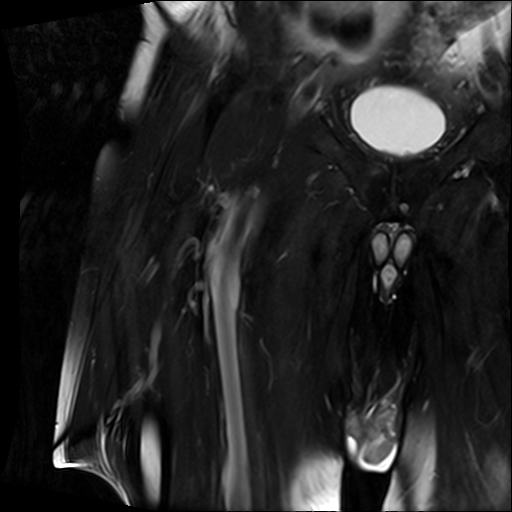
[im 10/20]
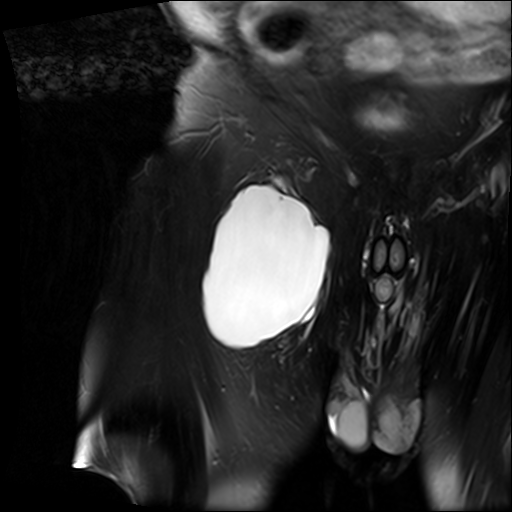
[im 15/20]
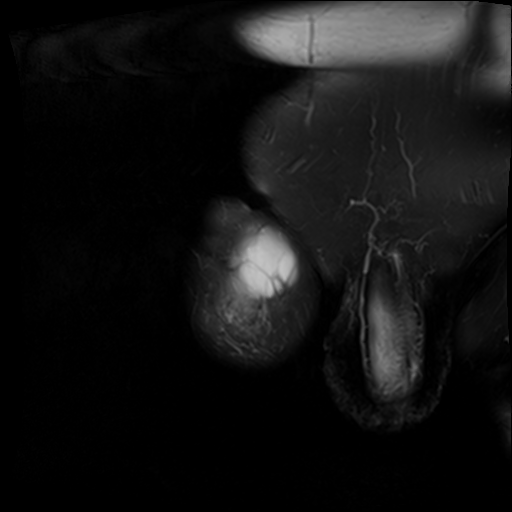
[im 20/20]
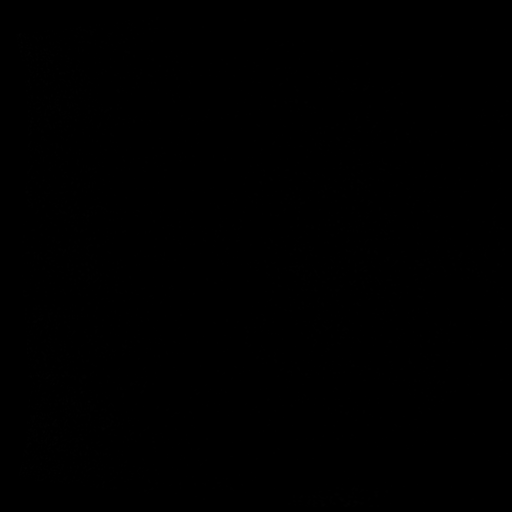

[Series 7: STIR · sagittal · 5.0mm · 0.94mm/px · 4 of 28 slices shown]
[im 1/28]
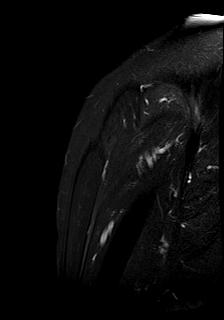
[im 5/28]
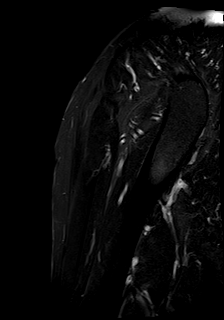
[im 10/28]
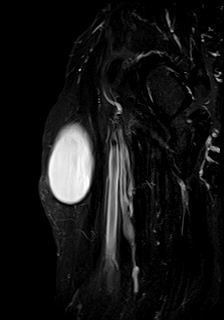
[im 14/28]
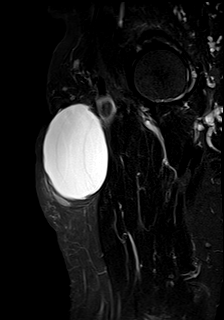

[24 of 48 positions shown; findings below may reference images not displayed]

FINDINGS: Bones/Joint/Cartilage

Unremarkable without fracture, stress change or worrisome lesion.

Ligaments

Intact.

Muscles and Tendons

Intact.

Soft tissues

A lesion in the right groin measuring approximately 10 cm
craniocaudal by 7 cm transverse by 6 cm is seen in the region of
concern. The lesion is uniformly T2 hyperintense, T1 hypointense and
shows very mild peripheral enhancement after IV contrast. No soft
tissue or nodule within the lesion is identified. The patient had a
cystic lesion in this location on the prior CT which measured 3.0 x
1.9 x 4 cm and the report of the prior CT indicates the lesion was
also present in [HK]. Images for the [HK] exam are not available.
IMPRESSION: Large cystic lesion in the right groin has been present since [HK]
and has slowly increased in size. The lesion has benign features and
could represent a seroma or lymphangioma. It should be easily
amenable to percutaneous aspiration. The exam is otherwise negative.

## 2020-02-26 MED ORDER — GADOBENATE DIMEGLUMINE 529 MG/ML IV SOLN
20.0000 mL | Freq: Once | INTRAVENOUS | Status: AC | PRN
  Administered 2020-02-26: 20 mL via INTRAVENOUS

## 2020-03-05 DIAGNOSIS — E782 Mixed hyperlipidemia: Secondary | ICD-10-CM | POA: Diagnosis not present

## 2020-03-05 DIAGNOSIS — K719 Toxic liver disease, unspecified: Secondary | ICD-10-CM | POA: Diagnosis not present

## 2020-03-07 DIAGNOSIS — E113293 Type 2 diabetes mellitus with mild nonproliferative diabetic retinopathy without macular edema, bilateral: Secondary | ICD-10-CM | POA: Diagnosis not present

## 2020-03-07 DIAGNOSIS — H35033 Hypertensive retinopathy, bilateral: Secondary | ICD-10-CM | POA: Diagnosis not present

## 2020-03-07 DIAGNOSIS — Z961 Presence of intraocular lens: Secondary | ICD-10-CM | POA: Diagnosis not present

## 2020-03-07 DIAGNOSIS — H40023 Open angle with borderline findings, high risk, bilateral: Secondary | ICD-10-CM | POA: Diagnosis not present

## 2020-03-11 ENCOUNTER — Other Ambulatory Visit (HOSPITAL_COMMUNITY): Payer: Self-pay | Admitting: Surgery

## 2020-03-11 ENCOUNTER — Encounter (HOSPITAL_COMMUNITY): Payer: Self-pay | Admitting: Radiology

## 2020-03-11 DIAGNOSIS — R1909 Other intra-abdominal and pelvic swelling, mass and lump: Secondary | ICD-10-CM

## 2020-03-11 NOTE — Progress Notes (Signed)
Anthony [name] Male, 74 y.o., 11-10-1945 MRN:  [id number] Phone:  [phone] (H) ... PCP:  Merrilee Seashore, MD Primary Cvg:  Medicare/Medicare Part A And B Next Appt With Radiology ([facility]-US 2) 03/19/2020 at 1:00 PM  RE: CT Guided Drainage Received: Today Arne Cleveland, MD  Echo Propp D Ok   US aspiration R thigh/ing canal process  Sent for cyto and cell count  Present since 2016    .  DD[facility]       Previous Messages   ----- Message -----  From: Garth Bigness D  Sent: 03/11/2020  3:57 PM EDT  To: Ir Procedure Requests  Subject: CT Guided Drainage                Procedure: CT IMAGE GUIDED DRAINAGE BY PERCUTANEOUS CATHETER   Reason: Right groin mass, obtain ultrasound guided aspiration of right groin mass-MRI reports fluid filled   History: MRI in computer   Provider: Armandina Gemma   Provider Contact: [phone]

## 2020-03-18 ENCOUNTER — Other Ambulatory Visit: Payer: Self-pay | Admitting: Radiology

## 2020-03-19 ENCOUNTER — Other Ambulatory Visit (HOSPITAL_COMMUNITY): Payer: Self-pay | Admitting: Surgery

## 2020-03-19 ENCOUNTER — Other Ambulatory Visit: Payer: Self-pay

## 2020-03-19 ENCOUNTER — Ambulatory Visit (HOSPITAL_COMMUNITY)
Admission: RE | Admit: 2020-03-19 | Discharge: 2020-03-19 | Disposition: A | Discharge status: Home / Self Care | Payer: Medicare Other | Source: Ambulatory Visit | Attending: Surgery | Admitting: Surgery

## 2020-03-19 ENCOUNTER — Other Ambulatory Visit: Payer: Medicare Other

## 2020-03-19 ENCOUNTER — Encounter (HOSPITAL_COMMUNITY): Payer: Self-pay

## 2020-03-19 DIAGNOSIS — Z794 Long term (current) use of insulin: Secondary | ICD-10-CM | POA: Diagnosis not present

## 2020-03-19 DIAGNOSIS — Z7982 Long term (current) use of aspirin: Secondary | ICD-10-CM | POA: Diagnosis not present

## 2020-03-19 DIAGNOSIS — Z8546 Personal history of malignant neoplasm of prostate: Secondary | ICD-10-CM | POA: Diagnosis not present

## 2020-03-19 DIAGNOSIS — Z79899 Other long term (current) drug therapy: Secondary | ICD-10-CM | POA: Insufficient documentation

## 2020-03-19 DIAGNOSIS — Z8249 Family history of ischemic heart disease and other diseases of the circulatory system: Secondary | ICD-10-CM | POA: Diagnosis not present

## 2020-03-19 DIAGNOSIS — Z85038 Personal history of other malignant neoplasm of large intestine: Secondary | ICD-10-CM | POA: Diagnosis not present

## 2020-03-19 DIAGNOSIS — R1909 Other intra-abdominal and pelvic swelling, mass and lump: Secondary | ICD-10-CM | POA: Insufficient documentation

## 2020-03-19 DIAGNOSIS — Z87891 Personal history of nicotine dependence: Secondary | ICD-10-CM | POA: Diagnosis not present

## 2020-03-19 DIAGNOSIS — K219 Gastro-esophageal reflux disease without esophagitis: Secondary | ICD-10-CM | POA: Insufficient documentation

## 2020-03-19 DIAGNOSIS — E785 Hyperlipidemia, unspecified: Secondary | ICD-10-CM | POA: Diagnosis not present

## 2020-03-19 DIAGNOSIS — Z8042 Family history of malignant neoplasm of prostate: Secondary | ICD-10-CM | POA: Diagnosis not present

## 2020-03-19 DIAGNOSIS — I10 Essential (primary) hypertension: Secondary | ICD-10-CM | POA: Diagnosis not present

## 2020-03-19 DIAGNOSIS — E119 Type 2 diabetes mellitus without complications: Secondary | ICD-10-CM | POA: Diagnosis not present

## 2020-03-19 LAB — GLUCOSE, CAPILLARY
Glucose-Capillary: 66 mg/dL — ABNORMAL LOW (ref 70–99)
Glucose-Capillary: 69 mg/dL — ABNORMAL LOW (ref 70–99)
Glucose-Capillary: 93 mg/dL (ref 70–99)

## 2020-03-19 LAB — CBC
HCT: 43.3 % (ref 39.0–52.0)
Hemoglobin: 14.5 g/dL (ref 13.0–17.0)
MCH: 30.3 pg (ref 26.0–34.0)
MCHC: 33.5 g/dL (ref 30.0–36.0)
MCV: 90.4 fL (ref 80.0–100.0)
Platelets: 231 10*3/uL (ref 150–400)
RBC: 4.79 MIL/uL (ref 4.22–5.81)
RDW: 12.8 % (ref 11.5–15.5)
WBC: 10.5 10*3/uL (ref 4.0–10.5)
nRBC: 0 % (ref 0.0–0.2)

## 2020-03-19 LAB — PROTIME-INR
INR: 1.1 (ref 0.8–1.2)
Prothrombin Time: 13.6 seconds (ref 11.4–15.2)

## 2020-03-19 IMAGING — US US GUIDANCE NEEDLE PLACEMENT
1 series · 13 of 16 positions shown · non-contrast
Comparison: Pelvic MRI-[DATE]

INDICATION: Remote history of prostate and colon cancer with chronic though now
slowly enlarging right inguinal fluid collection favored to
represent a seroma on pelvic MRI performed [DATE].

EXAM:
IR ULTRASOUND GUIDED ASPIRATION/DRAINAGE

[Series 1: us fna biopsy soft tissue 1st lesion · 16 acquisitions, 13 frames shown]
[im 1/16]
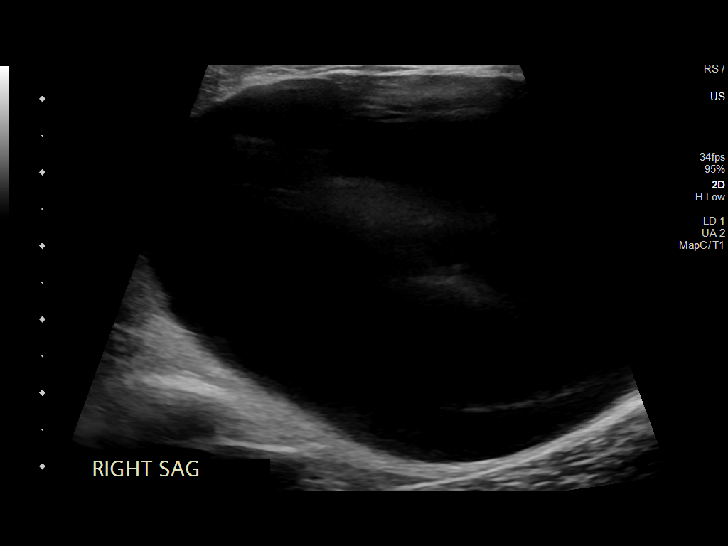
[im 2/16]
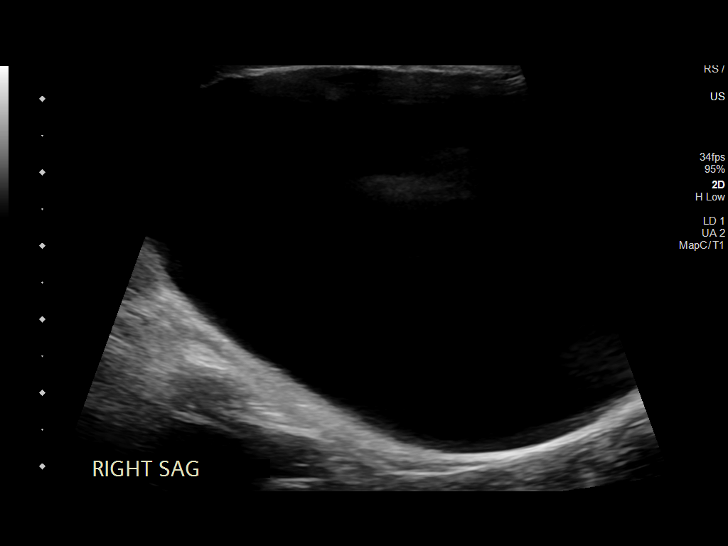
[im 4/16]
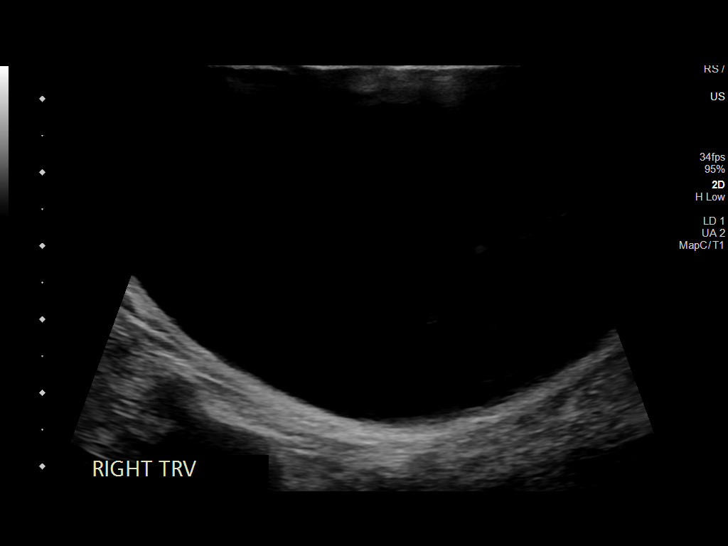
[im 5/16]
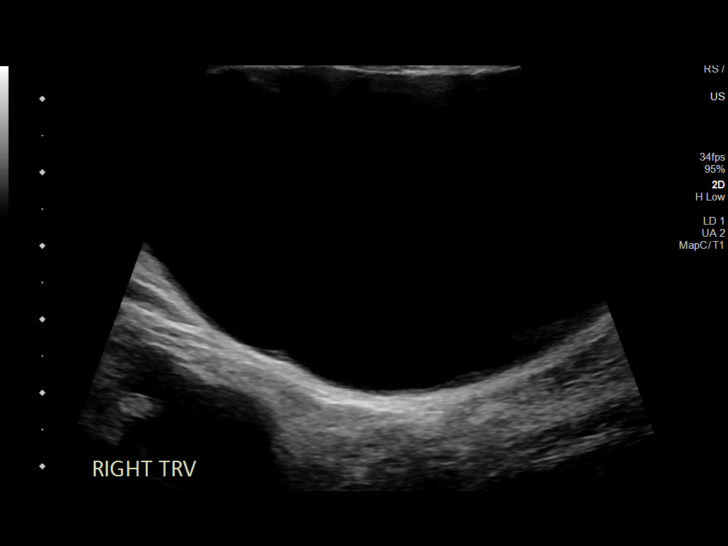
[im 6/16]
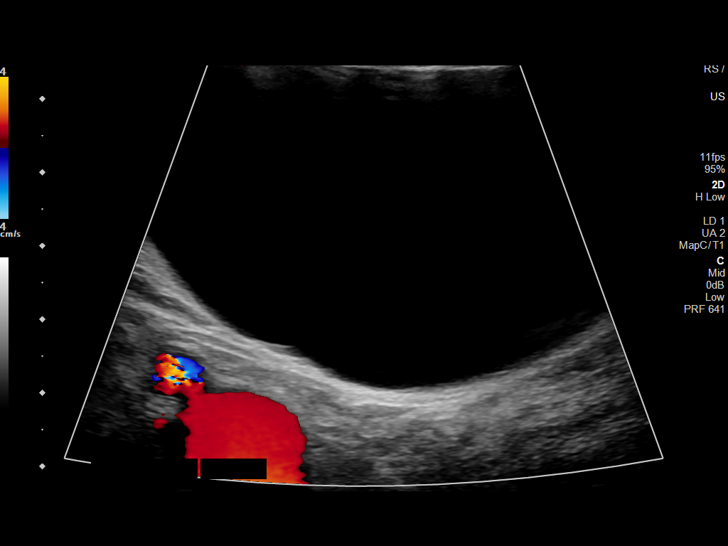
[im 7/16]
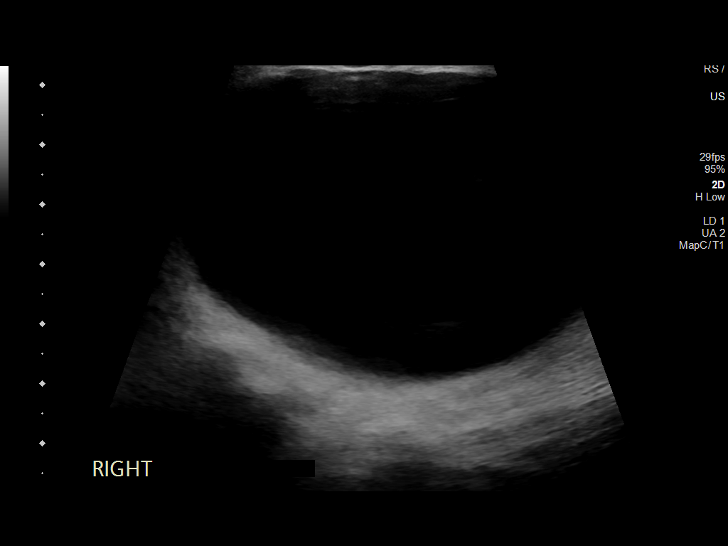
[im 9/16]
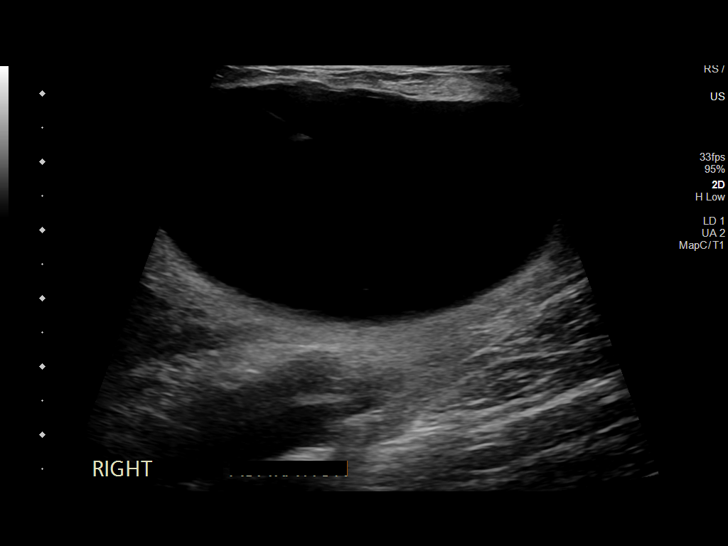
[im 10/16]
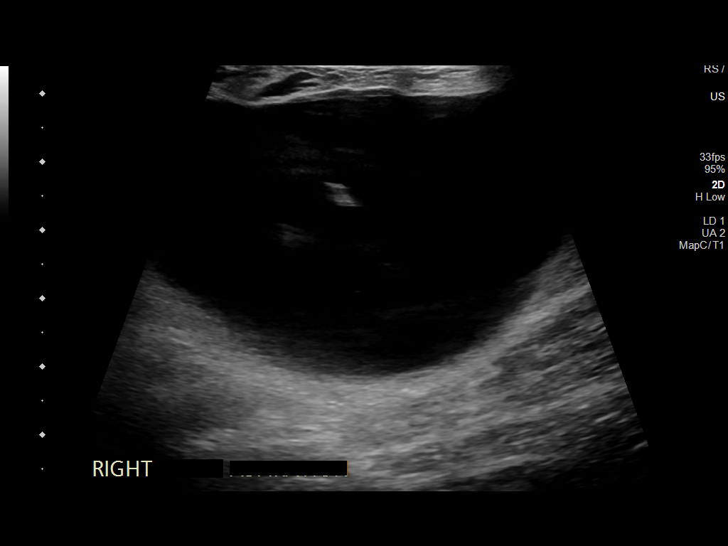
[im 11/16]
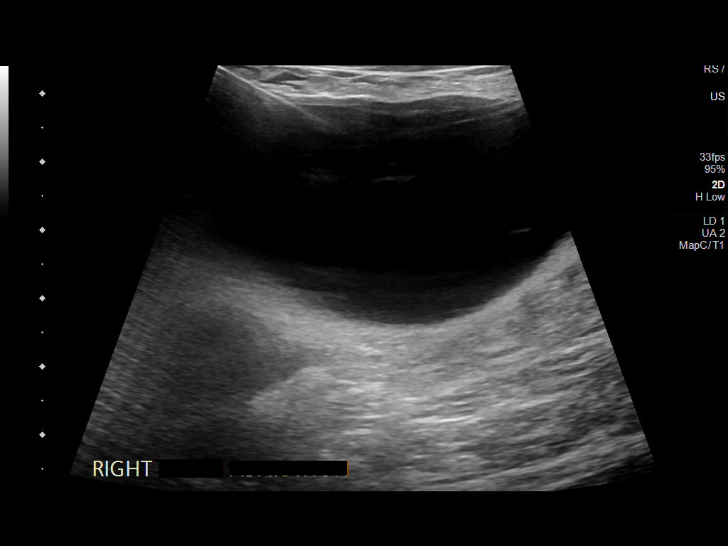
[im 12/16]
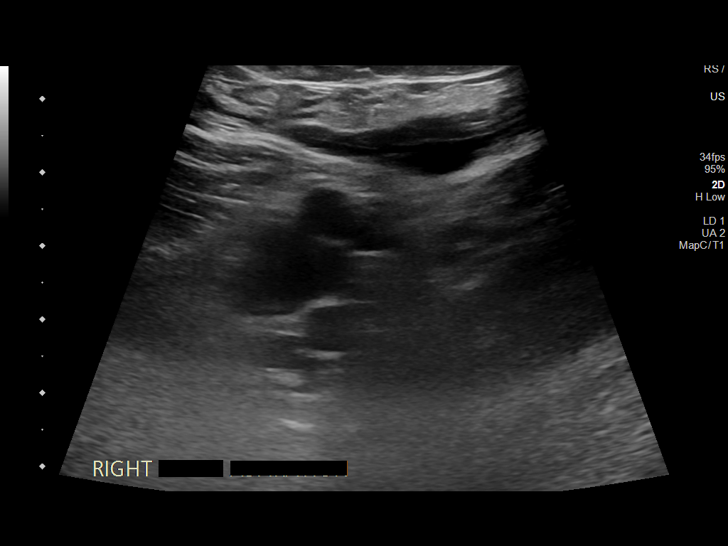
[im 13/16]
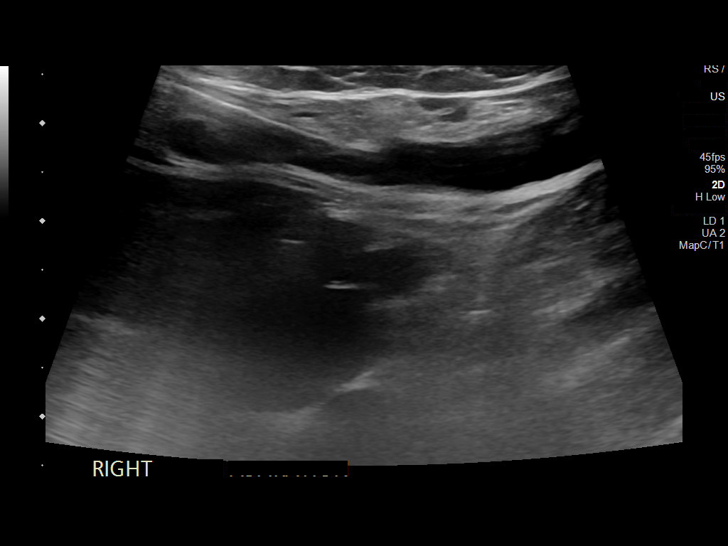
[im 15/16]
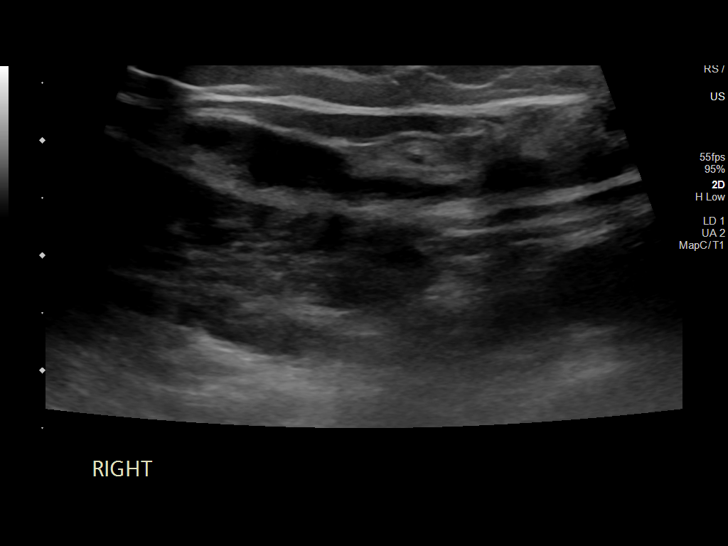
[im 16/16]
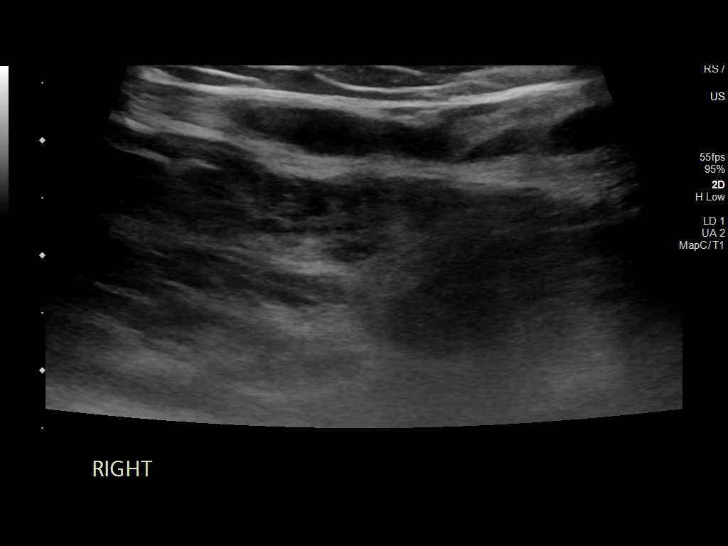

[13 of 16 positions shown; findings below may reference images not displayed]

MEDICATIONS:
None

ANESTHESIA/SEDATION:
None.

CONTRAST:  None

COMPLICATIONS:
None immediate.

PROCEDURE:
Informed written consent was obtained from the patient after a
discussion of the risks, benefits and alternatives to treatment. The
patient was placed supine with preprocedural ultrasound scanning
demonstrating grossly unchanged size and appearance of the at least
7.1 x 5.2 cm anechoic fluid collection within the right groin
(representative image 4).

The procedure was planned. A timeout was performed prior to the
initiation of the procedure.

The right groin was prepped and draped in the usual sterile fashion.
The overlying soft tissues were anesthetized with 1% lidocaine with
epinephrine.

Under direct ultrasound guidance, the right groin cystic collection
was accessed with an 18 gauge spinal needle. Ultrasound image was
saved for procedural documentation purposes.

Next, approximately 200 cc of simple serous appearing fluid was
aspirated from the collection. A representative sample was capped
and sent to the laboratory for analysis.

Postprocedural ultrasound scanning demonstrates complete resolution
of the right groin presumed seroma.

A dressing was applied. The patient tolerated the procedure well
without immediate post procedural complication.
IMPRESSION: Successful ultrasound guided aspiration of approximately 200 cc of
simple serous appearing fluid from right groin presumed seroma.

Samples were sent to the laboratory as requested by the ordering
clinical team.

## 2020-03-19 MED ORDER — GLUCOSE 4 G PO CHEW
CHEWABLE_TABLET | ORAL | Status: AC
  Filled 2020-03-19: qty 1

## 2020-03-19 MED ORDER — GLUCOSE 4 G PO CHEW
1.0000 | CHEWABLE_TABLET | Freq: Once | ORAL | Status: DC
  Filled 2020-03-19: qty 1

## 2020-03-19 MED ORDER — LIDOCAINE-EPINEPHRINE 1 %-1:100000 IJ SOLN
INTRAMUSCULAR | Status: AC
  Filled 2020-03-19: qty 1

## 2020-03-19 MED ORDER — SODIUM CHLORIDE 0.9 % IV SOLN: INTRAVENOUS | Status: DC

## 2020-03-19 MED ORDER — GLUCOSE 4 G PO CHEW
1.0000 | CHEWABLE_TABLET | Freq: Once | ORAL | Status: AC
  Administered 2020-03-19 (×2): 4 g via ORAL
  Filled 2020-03-19: qty 1

## 2020-03-19 MED ORDER — MIDAZOLAM HCL 2 MG/2ML IJ SOLN
INTRAMUSCULAR | Status: AC
  Filled 2020-03-19: qty 2

## 2020-03-19 MED ORDER — FENTANYL CITRATE (PF) 100 MCG/2ML IJ SOLN
INTRAMUSCULAR | Status: DC
Start: 2020-03-19 — End: 2020-03-19
  Filled 2020-03-19: qty 2

## 2020-03-19 NOTE — Procedures (Signed)
Pre Procedure Dx: Right groin fluid collection Post Procedural Dx: Same  Technically successful US guided aspiration of right groin presumed seroma yielding 200 cc of simple serous fluid. Representative sample capped and sent to the lab for analysis.   EBL: None No immediate complications.   Ronny Bacon, MD Pager #: [phone]

## 2020-03-19 NOTE — H&P (Signed)
Chief Complaint: Patient was seen in consultation today for right groin/inginal fluid collection aspiration/biopsy at the request of Gerkin,Todd  Referring Physician(s): Gerkin,Todd  Supervising Physician: Sandi Mariscal  Patient Status: [facility] - Out-pt  History of Present Illness: Anthony [name] is a 74 y.o. male   Hx Prostate and Colon Cancer 2019 Rt thigh groin mass has been evident since 2013 No growth and no changes no pain until last several months Growing and fairly quickly per pt; painful  MR 02/26/20: A lesion in the right groin measuring approximately 10 cm craniocaudal by 7 cm transverse by 6 cm is seen in the region of Concern. IMPRESSION: Large cystic lesion in the right groin has been present since 2013 and has slowly increased in size. The lesion has benign features and could represent a seroma or lymphangioma. It should be easily amenable to percutaneous aspiration.  Scheduled now for aspiration/biopsy of lesion  Past Medical History:  Diagnosis Date  . Agent orange exposure   . Anxiety   . At risk for sleep apnea    STOP BANG SCORE= 5             SENT TO PCP 11-24-2017  . Chronic calcific pancreatitis ([place]) 2000  . Colon cancer [facility])    09/ 2016  sigmoid cancer  s/p  left hemicolectomy (negative nodes and margins)  . Depression   . GERD (gastroesophageal reflux disease)   . Hemorrhoids   . History of adenomatous polyp of colon   . Hyperlipidemia   . Hyperplasia of prostate with lower urinary tract symptoms (LUTS)   . Hypertension   . Lipoma   . Peripheral neuropathy   . Prostate cancer [facility]) urologist-  dr Tresa Moore  oncologist-  dr Tammi Klippel   dx 08-02-2017-- Stage T1c, Gleason, 3+4, PSA 5.07, vol 38cc--  scheduled for radioactive seed implants 11-29-2017  . Type 2 diabetes mellitus with insulin therapy ([place])    Pt reports he is Type 1 Diabetic-diagnosed at age 60/  followed by pcp  . Wears glasses     Past Surgical History:  Procedure Laterality Date    . COLONOSCOPY  last one 03-14-2015  . CYSTOSCOPY N/A 11/29/2017   Procedure: CYSTOSCOPY;  Surgeon: Alexis Frock, MD;  Location: [facility];  Service: Urology;  Laterality: N/A;  no seeds in bladder per Dr Tresa Moore  . EVALUATION UNDER ANESTHESIA WITH HEMORRHOIDECTOMY N/A 05/29/2015   Procedure: ANAL EXAM UNDER ANESTHESIA WITH  HEMORRHOIDOPEXY; RIGID SIGMOIDOSCOPY;  Surgeon: Leighton Ruff, MD;  Location: [facility];  Service: General;  Laterality: N/A;  . LAPAROSCOPIC PARTIAL COLECTOMY N/A 03/29/2015   Procedure: LAPAROSCOPIC SIGMOIDECTOMY;  Surgeon: Leighton Ruff, MD;  Location: [facility];  Service: General;  Laterality: N/A;  . NASAL SEPTUM SURGERY  2006  . PROSTATE BIOPSY  08-02-2017  dr Tresa Moore office  . RADIOACTIVE SEED IMPLANT N/A 11/29/2017   Procedure: RADIOACTIVE SEED IMPLANT/BRACHYTHERAPY IMPLANT;  Surgeon: Alexis Frock, MD;  Location: [facility];  Service: Urology;  Laterality: N/A;  . SPACE OAR INSTILLATION N/A 11/29/2017   Procedure: SPACE OAR INSTILLATION;  Surgeon: Alexis Frock, MD;  Location: [facility];  Service: Urology;  Laterality: N/A;    Allergies: Atorvastatin, Other, Shellfish allergy, Toradol [ketorolac tromethamine], and Adhesive [tape]  Medications: Prior to Admission medications   Medication Sig Start Date End Date Taking? Authorizing Provider  amLODipine (NORVASC) 10 MG tablet Take 10 mg by mouth every morning.    Yes [provider]  celecoxib (CELEBREX)  200 MG capsule Take 200 mg by mouth daily. 02/05/20  Yes [provider]  Evolocumab (REPATHA) 140 MG/ML SOSY Inject 140 mg into the skin every 14 (fourteen) days.   Yes [provider]  fosinopril (MONOPRIL) 10 MG tablet Take 10 mg by mouth daily.   Yes [provider]  hydrochlorothiazide (MICROZIDE) 12.5 MG capsule Take 12.5 mg by mouth daily.    Yes [provider]  insulin aspart (NOVOLOG) 100 UNIT/ML  injection Inject 30-45 Units into the skin daily. Via pump   Yes [provider]  pantoprazole (PROTONIX) 40 MG tablet Take 40 mg by mouth every morning.    Yes [provider]  tamsulosin (FLOMAX) 0.4 MG CAPS capsule Take 1 capsule (0.4 mg total) by mouth daily as needed. For urinary urgency after prostate radiation. Patient taking differently: Take 0.4 mg by mouth daily.  11/29/17  Yes Alexis Frock, MD  traZODone (DESYREL) 50 MG tablet Take 50 mg by mouth at bedtime.   Yes [provider]  ACCU-CHEK COMPACT PLUS test strip  11/20/17   [provider]  ALPRAZolam Duanne Moron) 1 MG tablet Take 1-2 tablets thirty minutes prior to MRI.  May take one additional tablet before entering scanner, if needed.  MUST HAVE DRIVER. Patient not taking: Reported on 03/14/2020 02/13/20   Sater, Nanine Means, MD  ALPRAZolam Duanne Moron) 1 MG tablet Take 1-2 tablets 30 minutes prior to MRI, may repeat once as needed. Must have driver. Patient not taking: Reported on 03/14/2020 02/12/20   Marcial Pacas, MD  aspirin 325 MG tablet Take 325 mg by mouth 2 (two) times a week. And takes as needed for headaches Patient not taking: Reported on 03/14/2020    [provider]  carboxymethylcellulose (REFRESH PLUS) 0.5 % SOLN Apply 1 drop to eye daily as needed (FOR EYE IRRITATION). Patient not taking: Reported on 03/14/2020    [provider]     Family History  Problem Relation Age of Onset  . Hyperlipidemia Father   . Stroke Father   . Prostate cancer Father   . Heart attack Father   . Diabetes Maternal Uncle   . Cancer Brother        unknown blood cancer  . Healthy Mother     Social History   Socioeconomic History  . Marital status: Married    Spouse name: Not on file  . Number of children: 2  . Years of education: college  . Highest education level: Not on file  Occupational History  . Occupation: Retired  Tobacco Use  . Smoking status: Former Smoker    Years: 30.00     Types: Cigarettes    Quit date: 06/26/2009    Years since quitting: 10.7  . Smokeless tobacco: Never Used  Vaping Use  . Vaping Use: Never used  Substance and Sexual Activity  . Alcohol use: Not Currently  . Drug use: No  . Sexual activity: Not Currently  Other Topics Concern  . Not on file  Social History Narrative   Resides in [place], [place]. Lives with his wife. Believes he was exposed to agent orange in the [place] War.    Right-handed.   No daily caffeine use.   Social Determinants of Health   Financial Resource Strain:   . Difficulty of Paying Living Expenses: Not on file  Food Insecurity:   . Worried About Charity fundraiser in the Last Year: Not on file  . Ran Out of Food in the Last  Year: Not on file  Transportation Needs:   . Lack of Transportation (Medical): Not on file  . Lack of Transportation (Non-Medical): Not on file  Physical Activity:   . Days of Exercise per Week: Not on file  . Minutes of Exercise per Session: Not on file  Stress:   . Feeling of Stress : Not on file  Social Connections:   . Frequency of Communication with Friends and Family: Not on file  . Frequency of Social Gatherings with Friends and Family: Not on file  . Attends Religious Services: Not on file  . Active Member of Clubs or Organizations: Not on file  . Attends Archivist Meetings: Not on file  . Marital Status: Not on file    Review of Systems: A 12 point ROS discussed and pertinent positives are indicated in the HPI above.  All other systems are negative.  Review of Systems  Constitutional: Negative for activity change, fatigue and fever.  Respiratory: Negative for cough and shortness of breath.   Gastrointestinal: Negative for abdominal pain.  Neurological: Negative for weakness.  Psychiatric/Behavioral: Negative for behavioral problems and confusion.    Vital Signs: BP 113/70   Pulse 97   Temp 98.4 F (36.9 C) (Oral)   Ht 6\' 2"  (1.88 m)   Wt 220 lb (99.8 kg)    SpO2 98%   BMI 28.25 kg/m   Physical Exam Vitals reviewed.  HENT:     Mouth/Throat:     Mouth: Mucous membranes are moist.  Cardiovascular:     Rate and Rhythm: Normal rate and regular rhythm.     Heart sounds: Normal heart sounds.  Pulmonary:     Effort: Pulmonary effort is normal.     Breath sounds: Normal breath sounds.  Abdominal:     Palpations: Abdomen is soft.  Musculoskeletal:        General: Normal range of motion.     Comments: Right groin lesion-- Visible; soft; immobile; sl tender to palpate  Skin:    General: Skin is warm.  Neurological:     Mental Status: He is alert and oriented to person, place, and time.  Psychiatric:        Behavior: Behavior normal.     Imaging: MR PELVIS W WO CONTRAST  Result Date: 02/27/2020 CLINICAL DATA:  Right groin swelling for 4 months. The patient has a history of prostate and colon cancer. EXAM: MRI PELVIS WITHOUT AND WITH CONTRAST TECHNIQUE: Multiplanar multisequence MR imaging of the pelvis was performed both before and after administration of intravenous contrast. CONTRAST:  20 mL MULTIHANCE GADOBENATE DIMEGLUMINE 529 MG/ML IV SOLN COMPARISON:  CT chest, abdomen and pelvis 03/14/2015. FINDINGS: Bones/Joint/Cartilage Unremarkable without fracture, stress change or worrisome lesion. Ligaments Intact. Muscles and Tendons Intact. Soft tissues A lesion in the right groin measuring approximately 10 cm craniocaudal by 7 cm transverse by 6 cm is seen in the region of concern. The lesion is uniformly T2 hyperintense, T1 hypointense and shows very mild peripheral enhancement after IV contrast. No soft tissue or nodule within the lesion is identified. The patient had a cystic lesion in this location on the prior CT which measured 3.0 x 1.9 x 4 cm and the report of the prior CT indicates the lesion was also present in 2013. Images for the 2013 exam are not available. IMPRESSION: Large cystic lesion in the right groin has been present since 2013 and  has slowly increased in size. The lesion has benign features and could represent  a seroma or lymphangioma. It should be easily amenable to percutaneous aspiration. The exam is otherwise negative. Electronically Signed   By: Inge Rise M.D.   On: 02/27/2020 10:19    Labs:  CBC: No results for input(s): WBC, HGB, HCT, PLT in the last 8760 hours.  COAGS: No results for input(s): INR, APTT in the last 8760 hours.  BMP: No results for input(s): NA, K, CL, CO2, GLUCOSE, BUN, CALCIUM, CREATININE, GFRNONAA, GFRAA in the last 8760 hours.  Invalid input(s): CMP  LIVER FUNCTION TESTS: No results for input(s): BILITOT, AST, ALT, ALKPHOS, PROT, ALBUMIN in the last 8760 hours.  TUMOR MARKERS: No results for input(s): AFPTM, CEA, CA199, CHROMGRNA in the last 8760 hours.  Assessment and Plan:  Hx prostate and colon cancer Rt groin lesion since 2013-- now enlarging and painful Scheduled for aspiration/biopsy of same Risks and benefits of aspiration/biopsy of Rt groin lesion was discussed with the patient and/or patient's family including, but not limited to bleeding, infection, damage to adjacent structures or low yield requiring additional tests.  All of the questions were answered and there is agreement to proceed. Consent signed and in chart.    Thank you for this interesting consult.  I greatly enjoyed meeting Anthony [name] and look forward to participating in their care.  A copy of this report was sent to the requesting provider on this date.  Electronically Signed: Lavonia Drafts, PA-C 03/19/2020, 11:24 AM   I spent a total of  30 Minutes   in face to face in clinical consultation, greater than 50% of which was counseling/coordinating care for right groin lesion biopsy

## 2020-03-21 LAB — CYTOLOGY - NON PAP

## 2020-03-24 LAB — AEROBIC/ANAEROBIC CULTURE W GRAM STAIN (SURGICAL/DEEP WOUND): Culture: NO GROWTH

## 2020-04-10 ENCOUNTER — Ambulatory Visit (INDEPENDENT_AMBULATORY_CARE_PROVIDER_SITE_OTHER): Payer: Medicare Other | Admitting: Neurology

## 2020-04-10 ENCOUNTER — Other Ambulatory Visit: Payer: Self-pay

## 2020-04-10 ENCOUNTER — Telehealth: Payer: Self-pay | Admitting: Neurology

## 2020-04-10 DIAGNOSIS — R202 Paresthesia of skin: Secondary | ICD-10-CM

## 2020-04-10 DIAGNOSIS — M5441 Lumbago with sciatica, right side: Secondary | ICD-10-CM

## 2020-04-10 DIAGNOSIS — M6281 Muscle weakness (generalized): Secondary | ICD-10-CM

## 2020-04-10 NOTE — Procedures (Signed)
Full Name: Anthony [name] Gender: Male MRN #: [id number] Date of Birth: 06/03/1946    Visit Date: 04/10/2020 09:56 Age: 74 Years Examining Physician: Marcial Pacas, MD  Referring Physician: Marcial Pacas, MD Height: 6 feet 2 inch History: 74 year old male, presented with history of gradual onset of left hand muscle atrophy, weakness,  Summary of the test: Nerve conduction study: Left sural, superficial peroneal sensory responses were absent.  Bilateral ulnar sensory responses showed mildly prolonged peak latency with mild to moderately decreased snap amplitude.  Right radial sensory response was normal.  Bilateral median sensory response showed normal peak latency, with mildly decreased snap amplitude  Left tibial motor response showed moderately decreased CMAP amplitude.  Left peroneal to EDB, bilateral ulnar, and motor responses were normal  Electromyography: Selected needle examinations was performed at bilateral upper, lower extremity muscles, left cervical, bilateral lumbosacral paraspinal muscles.  There was evidence of chronic neuropathic changes involving bilateral tibialis anterior, medial gastrocnemius, tibialis posterior, peroneal longus, vastus lateralis, left biceps femoris.  There was evidence of increased insertional activity, mild spontaneous activity, polyphasic complex motor unit potential at bilateral lower lumbar paraspinal muscles.  There was also chronic neuropathic changes involving bilateral cervical paraspinal muscles, more obvious involvement of left cervical paraspinal muscles, involving left T1, C8 7, 6 myotomes.  There was no spontaneous activity at left cervical paraspinal muscles  Conclusion: This is an abnormal study.  There is electrodiagnostic evidence of active and chronic neuropathic changes involving bilateral lumbar sacral myotomes, L5-S1 more than L4; also involvement of bilateral cervical myotomes, most obvious on the left side, T1c 8 C7 more than  more proximal cervical myotomes.  Differentiation diagnosis of above findings include bilateral lumbosacral radiculopathy, cervical radiculopathy.  In addition, there is evidence of length dependent mild to moderate axonal sensorimotor polyneuropathy.    ------------------------------- Marcial Pacas M.D. PhD  [facility] [address] [place], [place] [address] Tel: [phone] Fax: [phone]  Verbal informed consent was obtained from the patient, patient was informed of potential superficial peroneal sensory responses were absent including bruising, bleeding, hematoma formation, infection, muscle weakness, muscle pain, numbness, among others.        [place]    Nerve / Sites Muscle Latency Ref. Amplitude Ref. Rel Amp Segments Distance Velocity Ref. Area    ms ms mV mV %  cm m/s m/s mVms  L Median - APB     Wrist APB 3.7 ?4.4 8.8 ?4.0 100 Wrist - APB 7   25.8     Upper arm APB 8.8  7.4  84.1 Upper arm - Wrist 26 51 ?49 23.5  R Median - APB     Wrist APB 4.0 ?4.4 4.0 ?4.0 100 Wrist - APB 7   16.6     Upper arm APB 9.4  3.3  82.1 Upper arm - Wrist 26 49 ?49 23.8  L Ulnar - ADM     Wrist ADM 3.3 ?3.3 7.1 ?6.0 100 Wrist - ADM 7   21.7     B.Elbow ADM 8.2  6.6  93.2 B.Elbow - Wrist 24 49 ?49 21.9     A.Elbow ADM 10.3  6.1  93.3 A.Elbow - B.Elbow 10 49 ?49 25.4  R Ulnar - ADM     Wrist ADM 2.9 ?3.3 8.5 ?6.0 100 Wrist - ADM 7   34.1     B.Elbow ADM 7.9  8.3  97.7 B.Elbow - Wrist 24 49 ?49 33.2  A.Elbow ADM 9.9  7.7  92.7 A.Elbow - B.Elbow 10 49 ?49 35.8  L Peroneal - EDB     Ankle EDB 5.5 ?6.5 3.1 ?2.0 100 Ankle - EDB 9   8.6     Fib head EDB 13.3  3.5  113 Fib head - Ankle 34 44 ?44 10.9     Pop fossa EDB 15.6  3.5  100 Pop fossa - Fib head 10 44 ?44 11.5         Pop fossa - Ankle      L Tibial - AH     Ankle AH 4.3 ?5.8 1.9 ?4.0 100 Ankle - AH 9   5.8     Pop fossa AH 17.0  1.4  71.6 Pop fossa - Ankle 41 32 ?41 4.6                 SNC    Nerve / Sites  Rec. Site Peak Lat Ref.  Amp Ref. Segments Distance    ms ms V V  cm  L Radial - Anatomical snuff box (Forearm)     Forearm Wrist 2.6 ?2.9 16 ?15 Forearm - Wrist 10  R Radial - Anatomical snuff box (Forearm)     Forearm Wrist 2.7 ?2.9 17 ?15 Forearm - Wrist 10  L Sural - Ankle (Calf)     Calf Ankle NR ?4.4 NR ?6 Calf - Ankle 14  L Superficial peroneal - Ankle     Lat leg Ankle NR ?4.4 NR ?6 Lat leg - Ankle 14  L Median - Orthodromic (Dig II, Mid palm)     Dig II Wrist 3.4 ?3.4 7 ?10 Dig II - Wrist 13  R Median - Orthodromic (Dig II, Mid palm)     Dig II Wrist 3.4 ?3.4 5 ?10 Dig II - Wrist 13  L Ulnar - Orthodromic, (Dig V, Mid palm)     Dig V Wrist 3.8 ?3.1 3 ?5 Dig V - Wrist 11  R Ulnar - Orthodromic, (Dig V, Mid palm)     Dig V Wrist 3.4 ?3.1 3 ?5 Dig V - Wrist 45                     F  Wave    Nerve F Lat Ref.   ms ms  L Ulnar - ADM 37.0 ?32.0  L Tibial - AH 63.2 ?56.0  R Ulnar - ADM 35.3 ?32.0           EMG Summary Table    Spontaneous MUAP Recruitment  Muscle IA Fib PSW Fasc Other Amp Dur. Poly Pattern  R. Tibialis anterior Normal None None None _______ Normal Normal Normal Normal  R. Tibialis posterior Normal None None None _______ Normal Normal Normal Normal  R. Peroneus longus Normal None None None _______ Normal Normal Normal Normal  R. Vastus lateralis Normal None None None _______ Normal Normal Normal Normal  R. Gastrocnemius (Medial head) Normal None None None _______ Normal Normal Normal Normal  L. Tibialis anterior Increased 1+ None None _______ Increased Increased 1+ Reduced  L. Tibialis posterior Increased 1+ None None _______ Increased Increased 1+ Reduced  L. Gastrocnemius (Medial head) Increased None None None _______ Increased Increased 1+ Reduced  L. Peroneus longus Increased None None None _______ Increased Increased 1+ Reduced  L. Vastus lateralis Increased None None None _______ Normal Normal Normal Reduced  R. Lumbar paraspinals (low) Increased 1+  None None _______ Normal Normal Normal Normal  R.  Lumbar paraspinals (mid) Increased None None None _______ Normal Normal Normal Normal  L. Lumbar paraspinals (low) Increased None None None _______ Normal Normal Normal Normal  L. Lumbar paraspinals (mid) Increased None None None _______ Normal Normal Normal Normal  L. First dorsal interosseous Increased 2+ 2+ None _______ Increased Increased 1+ Reduced  L. Pronator teres Increased None None None _______ Increased Increased 1+ Reduced  L. Biceps brachii Increased None None None _______ Increased Increased 1+ Reduced  L. Deltoid Normal None None None _______ Normal Normal Normal Reduced  L. Triceps brachii Normal None None None _______ Normal Normal Normal Reduced  L. Cervical paraspinals Normal None None None _______ Normal Normal Normal Normal  R. First dorsal interosseous Increased None None None _______ Normal Normal Normal Normal  R. Pronator teres Normal None None None _______ Normal Normal Normal Normal  R. Biceps brachii Normal None None None _______ Normal Normal Normal Normal  R. Deltoid Normal None None None _______ Normal Normal Normal Normal  R. Brachioradialis Normal None None None _______ Normal Normal Normal Normal

## 2020-04-10 NOTE — Telephone Encounter (Signed)
Noted, I sent the orders to GI. They will reach out to the patient to schedule.

## 2020-04-10 NOTE — Telephone Encounter (Signed)
I have ordered MRI cervical spine, he was confused about his schedule, I saw him was on schedule for February 20 2020.  Today, I ordered MRI lumbar.  Please make sure that he has both done

## 2020-04-10 NOTE — Progress Notes (Signed)
HISTORICAL  Anthony [name] is a 74 year old male, seen in request by his primary care physician Dr. Merrilee [name], for evaluation of bilateral upper extremity paresthesia, initial evaluation was on February 12, 2020,  I reviewed and summarized the referring note.  Past medical history -Hypertension, -Diabetes since 1990s, insulin-dependent since 2008,  -Anxiety, chronic insomnia, trazodone 50mg  qhs -Colon Cancer surgery in 2016, no chemo/radiation therapy -Abnormal LFTs-? statin treatment, off statin now, followed up by. Anthony Seashore, MD  CT of abdomen with contrast in June 2020, marked hepatic steatosis, atrophic pancreas with multiple calcification, consistent with chronic pancreatitis, postsurgical change of the sigmoid colon, radioactive seeds in the prostate gland,Lumbar degenerative changes, partially imaging homogeneous ovoid mass in the subcutaneous soft tissue often anteromedial right upper thigh, consistent with a benign lesion such as seroma or lymphocele  Around April 2021, he noticed numbness involving left arm, also noticed left medial elbow sensitivity, radiating discomfort, later he noticed left hand muscle atrophy, weakness, difficulty making a tight grip, in June 2021, he noticed similar but milder involvement of right hand, right arm,  He had a history of diabetes peripheral neuropathy for more than 10 years, describes spongy sensation at the bottom of his feet, lower extremity paresthesia stated below he denies gait abnormality, he denies significant low back pain, denies bowel and bladder incontinence.  UPDATE Sept 15 2021: Return for electrodiagnostic study today, which showed evidence of mild length dependent diabetic peripheral neuropathy, chronic neuropathic changes involving bilateral lumbar sacral myotomes, left cervical myotomes more than right cervical myotomes.  The most bothersome symptoms for patient is left hand weakness, difficulty opening up a bottle, he  does has a long history of mild bilateral feet paresthesia from his diabetes, he denies significant gait abnormality, right arm weakness, no bulbar weakness  He had intermittent flareup of low back pain, sometimes radiating pain to right lower extremity  REVIEW OF SYSTEMS: Full 14 system review of systems performed and notable only for as above All other review of systems were negative.  ALLERGIES: Allergies  Allergen Reactions  . Atorvastatin Other (See Comments)    Reports elevated liver enzymes.  . Other Other (See Comments)    Positive allergy test for peanuts and almonds  . Shellfish Allergy Other (See Comments)    Positive allergy test  . Toradol [Ketorolac Tromethamine] Other (See Comments)    Pt has chronic calcific pancreatitis  . Adhesive [Tape] Rash    Tegaderm     HOME MEDICATIONS: Current Outpatient Medications  Medication Sig Dispense Refill  . ACCU-CHEK COMPACT PLUS test strip   5  . ALPRAZolam (XANAX) 1 MG tablet Take 1-2 tablets thirty minutes prior to MRI.  May take one additional tablet before entering scanner, if needed.  MUST HAVE DRIVER. (Patient not taking: Reported on 03/14/2020) 3 tablet 0  . ALPRAZolam (XANAX) 1 MG tablet Take 1-2 tablets 30 minutes prior to MRI, may repeat once as needed. Must have driver. (Patient not taking: Reported on 03/14/2020) 3 tablet 0  . amLODipine (NORVASC) 10 MG tablet Take 10 mg by mouth every morning.     Marland Kitchen aspirin 325 MG tablet Take 325 mg by mouth 2 (two) times a week. And takes as needed for headaches (Patient not taking: Reported on 03/14/2020)    . carboxymethylcellulose (REFRESH PLUS) 0.5 % SOLN Apply 1 drop to eye daily as needed (FOR EYE IRRITATION). (Patient not taking: Reported on 03/14/2020)    . celecoxib (CELEBREX) 200 MG capsule Take 200 mg by  mouth daily.    . Evolocumab (REPATHA) 140 MG/ML SOSY Inject 140 mg into the skin every 14 (fourteen) days.    . fosinopril (MONOPRIL) 10 MG tablet Take 10 mg by mouth daily.     . hydrochlorothiazide (MICROZIDE) 12.5 MG capsule Take 12.5 mg by mouth daily.     . insulin aspart (NOVOLOG) 100 UNIT/ML injection Inject 30-45 Units into the skin daily. Via pump    . pantoprazole (PROTONIX) 40 MG tablet Take 40 mg by mouth every morning.     . tamsulosin (FLOMAX) 0.4 MG CAPS capsule Take 1 capsule (0.4 mg total) by mouth daily as needed. For urinary urgency after prostate radiation. (Patient taking differently: Take 0.4 mg by mouth daily. ) 30 capsule 11  . traZODone (DESYREL) 50 MG tablet Take 50 mg by mouth at bedtime.     No current facility-administered medications for this visit.    PAST MEDICAL HISTORY: Past Medical History:  Diagnosis Date  . Agent orange exposure   . Anxiety   . At risk for sleep apnea    STOP BANG SCORE= 5             SENT TO PCP 11-24-2017  . Chronic calcific pancreatitis ([place]) 2000  . Colon cancer [facility])    09/ 2016  sigmoid cancer  s/p  left hemicolectomy (negative nodes and margins)  . Depression   . GERD (gastroesophageal reflux disease)   . Hemorrhoids   . History of adenomatous polyp of colon   . Hyperlipidemia   . Hyperplasia of prostate with lower urinary tract symptoms (LUTS)   . Hypertension   . Lipoma   . Peripheral neuropathy   . Prostate cancer [facility]) urologist-  dr Tresa Moore  oncologist-  dr Tammi Klippel   dx 08-02-2017-- Stage T1c, Gleason, 3+4, PSA 5.07, vol 38cc--  scheduled for radioactive seed implants 11-29-2017  . Type 2 diabetes mellitus with insulin therapy ([place])    Pt reports he is Type 1 Diabetic-diagnosed at age 70/  followed by pcp  . Wears glasses     PAST SURGICAL HISTORY: Past Surgical History:  Procedure Laterality Date  . COLONOSCOPY  last one 03-14-2015  . CYSTOSCOPY N/A 11/29/2017   Procedure: CYSTOSCOPY;  Surgeon: Alexis Frock, MD;  Location: [facility];  Service: Urology;  Laterality: N/A;  no seeds in bladder per Dr Tresa Moore  . EVALUATION UNDER ANESTHESIA WITH HEMORRHOIDECTOMY N/A  05/29/2015   Procedure: ANAL EXAM UNDER ANESTHESIA WITH  HEMORRHOIDOPEXY; RIGID SIGMOIDOSCOPY;  Surgeon: Leighton Ruff, MD;  Location: [place];  Service: General;  Laterality: N/A;  . LAPAROSCOPIC PARTIAL COLECTOMY N/A 03/29/2015   Procedure: LAPAROSCOPIC SIGMOIDECTOMY;  Surgeon: Leighton Ruff, MD;  Location: [facility];  Service: General;  Laterality: N/A;  . NASAL SEPTUM SURGERY  2006  . PROSTATE BIOPSY  08-02-2017  dr Tresa Moore office  . RADIOACTIVE SEED IMPLANT N/A 11/29/2017   Procedure: RADIOACTIVE SEED IMPLANT/BRACHYTHERAPY IMPLANT;  Surgeon: Alexis Frock, MD;  Location[facility];  Service: Urology;  Laterality: N/A;  . SPACE OAR INSTILLATION N/A 11/29/2017   Procedure: SPACE OAR INSTILLATION;  Surgeon: Alexis Frock, MD;  Location: [facility];  Service: Urology;  Laterality: N/A;    FAMILY HISTORY: Family History  Problem Relation Age of Onset  . Hyperlipidemia Father   . Stroke Father   . Prostate cancer Father   . Heart attack Father   . Diabetes Maternal Uncle   . Cancer Brother  unknown blood cancer  . Healthy Mother     SOCIAL HISTORY: Social History   Socioeconomic History  . Marital status: Married    Spouse name: Not on file  . Number of children: 2  . Years of education: college  . Highest education level: Not on file  Occupational History  . Occupation: Retired  Tobacco Use  . Smoking status: Former Smoker    Years: 30.00    Types: Cigarettes    Quit date: 06/26/2009    Years since quitting: 10.7  . Smokeless tobacco: Never Used  Vaping Use  . Vaping Use: Never used  Substance and Sexual Activity  . Alcohol use: Not Currently  . Drug use: No  . Sexual activity: Not Currently  Other Topics Concern  . Not on file  Social History Narrative   Resides in [place], [place]. Lives with his wife. Believes he was exposed to agent orange in the [place] War.    Right-handed.   No daily caffeine use.   Social  Determinants of Health   Financial Resource Strain:   . Difficulty of Paying Living Expenses: Not on file  Food Insecurity:   . Worried About Charity fundraiser in the Last Year: Not on file  . Ran Out of Food in the Last Year: Not on file  Transportation Needs:   . Lack of Transportation (Medical): Not on file  . Lack of Transportation (Non-Medical): Not on file  Physical Activity:   . Days of Exercise per Week: Not on file  . Minutes of Exercise per Session: Not on file  Stress:   . Feeling of Stress : Not on file  Social Connections:   . Frequency of Communication with Friends and Family: Not on file  . Frequency of Social Gatherings with Friends and Family: Not on file  . Attends Religious Services: Not on file  . Active Member of Clubs or Organizations: Not on file  . Attends Archivist Meetings: Not on file  . Marital Status: Not on file  Intimate Partner Violence:   . Fear of Current or Ex-Partner: Not on file  . Emotionally Abused: Not on file  . Physically Abused: Not on file  . Sexually Abused: Not on file     PHYSICAL EXAM   There were no vitals filed for this visit. Not recorded     There is no height or weight on file to calculate BMI.  PHYSICAL EXAMNIATION:  Gen: NAD, conversant, well nourised, well groomed  NEUROLOGICAL EXAM:  MENTAL STATUS: Speech/cognition: Awake alert oriented to history taking care of conversation   CRANIAL NERVES: CN II: Visual fields are full to confrontation. Pupils are round equal and briskly reactive to light. CN III, IV, VI: extraocular movement are normal. No ptosis. CN V: Facial sensation is intact to light touch CN VII: Face is symmetric with normal eye closure  CN VIII: Hearing is normal to causal conversation. CN IX, X: Phonation is normal. CN XI: Head turning and shoulder shrug are intact  MOTOR: Moderate atrophy of left hand intrinsic muscles, mild bilateral upper extremity proximal muscle weakness,  mild to moderate left handgrip, finger abduction weakness, no significant right hand weakness, bilateral lower extremity motor strength was normal, left elbow Tinel's sign was present.  He has a large size right anterior/inner thigh lipoma, is under the care of [place] [facility]  REFLEXES: Reflexes are 1 and symmetric at the biceps, triceps, knees, and absent ankles. Plantar responses are flexor.  SENSORY:  Length dependent decreased light touch, pinprick, vibratory sensation to distal shin level  COORDINATION: There is no trunk or limb dysmetria noted.  GAIT/STANCE: Posture is normal. Gait is steady with normal steps, base, arm swing, and turning. Heel and toe walking are normal. Tandem gait is normal.  Romberg is absent.   DIAGNOSTIC DATA (LABS, IMAGING, TESTING) - I reviewed patient records, labs, notes, testing and imaging myself where available.   ASSESSMENT AND PLAN  Jailen Coward is a 73 y.o. male   Bilateral upper extremity paresthesia, left hand significant intrinsic muscle atrophy weakness., Evidence of diabetic peripheral neuropathy  EMG nerve conduction study today confirmed mild length dependent axonal peripheral neuropathy, but widespread chronic neuropathic changes involving bilateral lumbar sacral myotomes, left more than right cervical myotomes.  Differentiation diagnosis of his complaints include cervical radiculopathy, lumbar radiculopathy,  Proceed with MRI of lumbar, and cervical spine,  Return to clinic in 2 months  Marcial Pacas, M.D. Ph.D.  [facility] [address], [place], [place] [address] Ph: [phone] Fax: [phone]  CC:  Anthony [name], [address] [place],  [place] [place] [address]

## 2020-04-15 NOTE — Telephone Encounter (Signed)
Pt called and was informed that the MRI orders have been sent to GI. Phone number was given to pt.

## 2020-04-15 NOTE — Telephone Encounter (Signed)
Noted, thank you

## 2020-04-28 ENCOUNTER — Ambulatory Visit
Admission: RE | Admit: 2020-04-28 | Discharge: 2020-04-28 | Disposition: A | Discharge status: Home / Self Care | Payer: Medicare Other | Source: Ambulatory Visit | Attending: Neurology | Admitting: Neurology

## 2020-04-28 ENCOUNTER — Other Ambulatory Visit: Payer: Self-pay

## 2020-04-28 DIAGNOSIS — R202 Paresthesia of skin: Secondary | ICD-10-CM

## 2020-04-28 DIAGNOSIS — M5441 Lumbago with sciatica, right side: Secondary | ICD-10-CM

## 2020-04-28 DIAGNOSIS — M6281 Muscle weakness (generalized): Secondary | ICD-10-CM

## 2020-04-28 DIAGNOSIS — M4802 Spinal stenosis, cervical region: Secondary | ICD-10-CM | POA: Diagnosis not present

## 2020-04-28 DIAGNOSIS — M48061 Spinal stenosis, lumbar region without neurogenic claudication: Secondary | ICD-10-CM | POA: Diagnosis not present

## 2020-04-28 DIAGNOSIS — M5126 Other intervertebral disc displacement, lumbar region: Secondary | ICD-10-CM | POA: Diagnosis not present

## 2020-04-28 IMAGING — MR MR CERVICAL SPINE W/O CM
5 series · 29 of 48 positions shown · non-contrast
Comparison: None.

CLINICAL DATA: Bilateral arm numbness and tingling for 3-6 months.
History of prostate and colon carcinoma.

EXAM:
MRI CERVICAL SPINE WITHOUT CONTRAST
TECHNIQUE: Multiplanar, multisequence MR imaging of the cervical spine was
performed. No intravenous contrast was administered.

[Series 3: T2 · sagittal · 3.0mm · 0.82mm/px · 7 of 16 slices shown (1 of 2)]
[im 1/16]
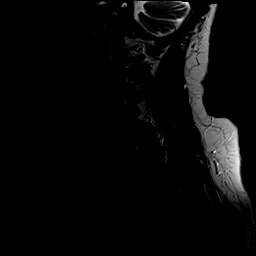
[im 3/16]
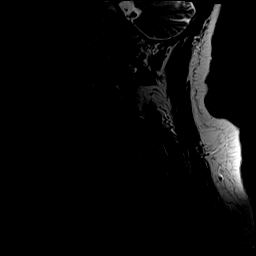
[im 6/16]
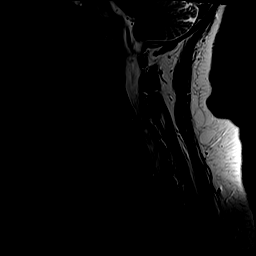
[im 8/16]
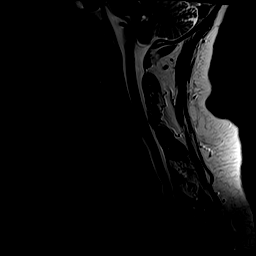
[im 11/16]
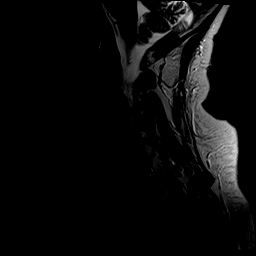
[im 13/16]
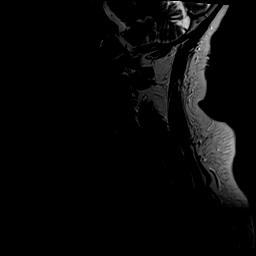
[im 16/16]
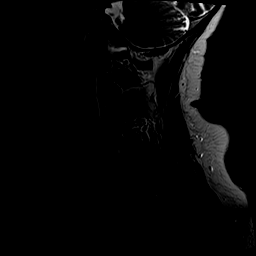

[Series 4: T1 · sagittal · 3.0mm · 0.41mm/px · 7 of 16 slices shown]
[im 1/16]
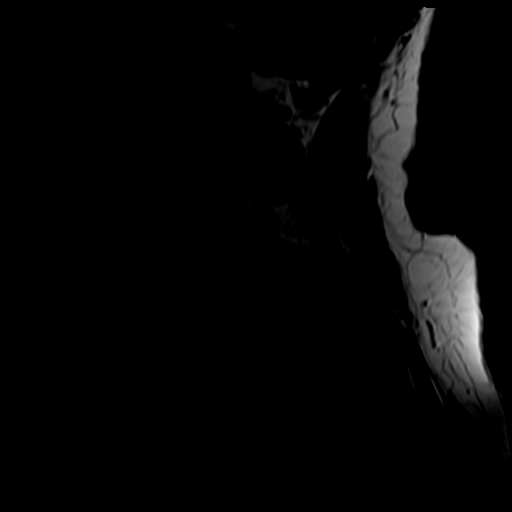
[im 3/16]
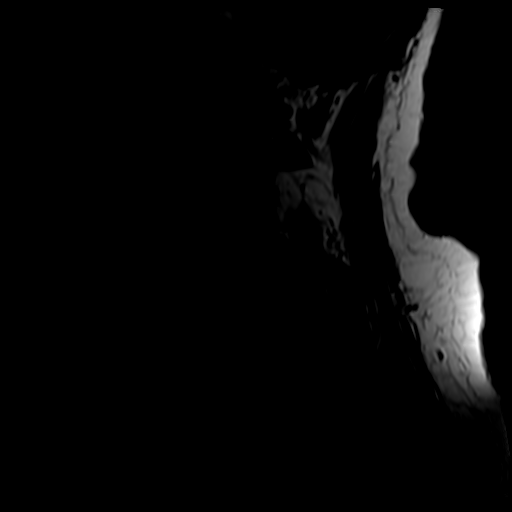
[im 6/16]
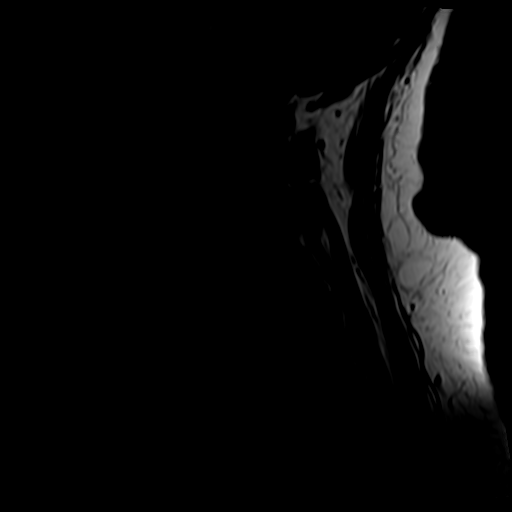
[im 8/16]
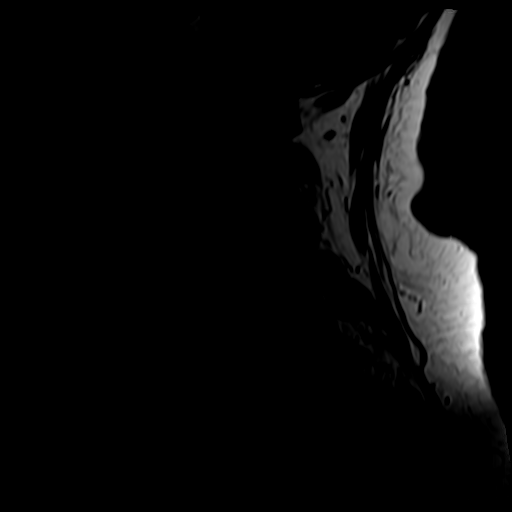
[im 11/16]
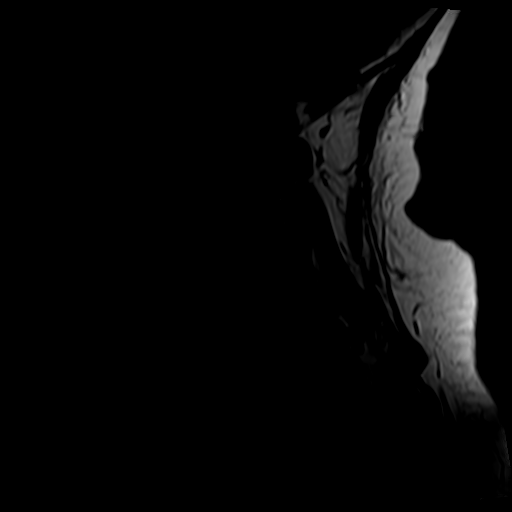
[im 13/16]
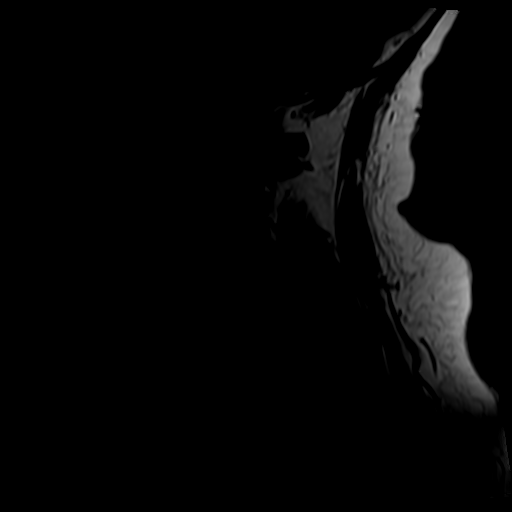
[im 16/16]
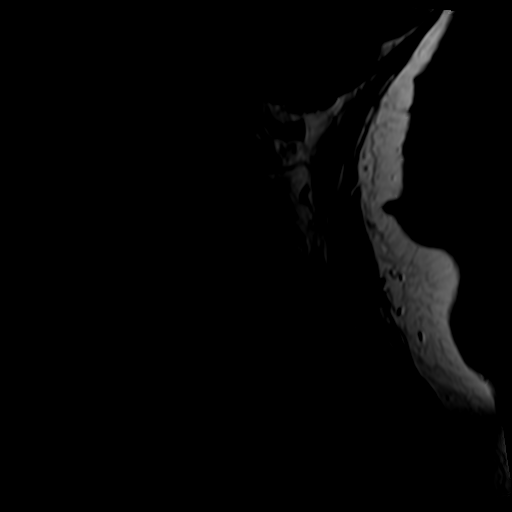

[Series 5: tir sag · sagittal · 3.0mm · 0.41mm/px · 6 of 16 slices shown]
[im 1/16]
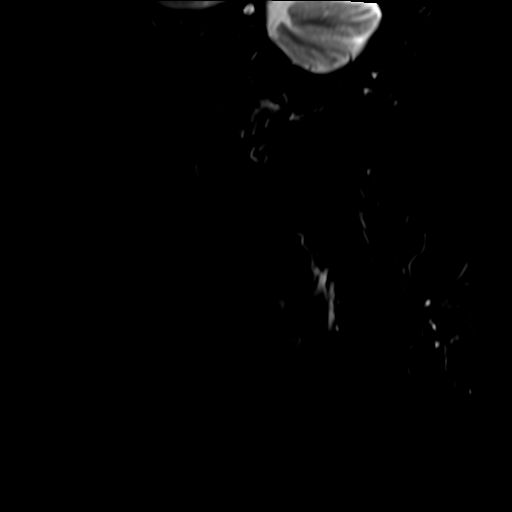
[im 4/16]
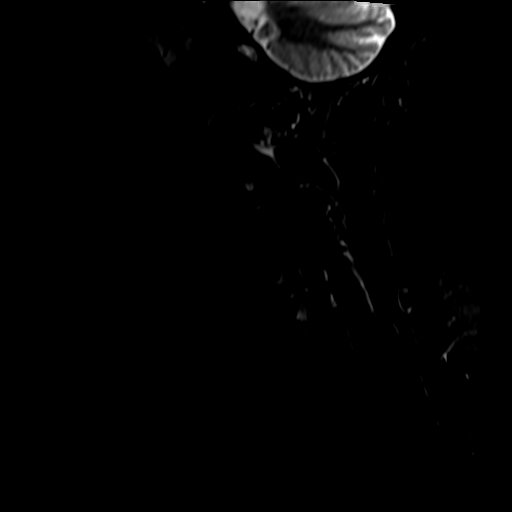
[im 7/16]
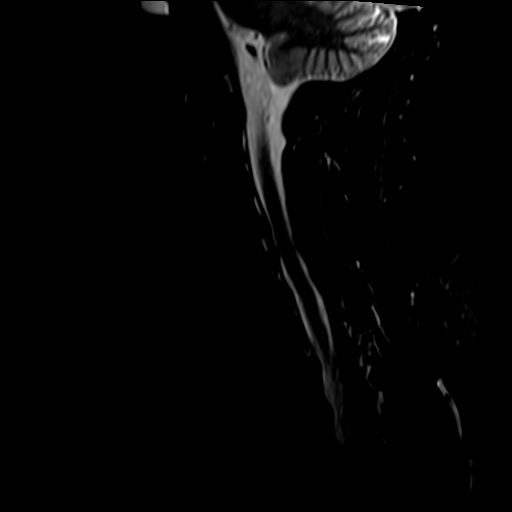
[im 10/16]
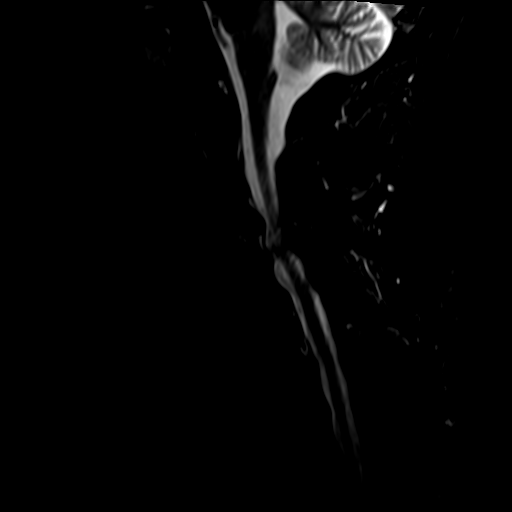
[im 13/16]
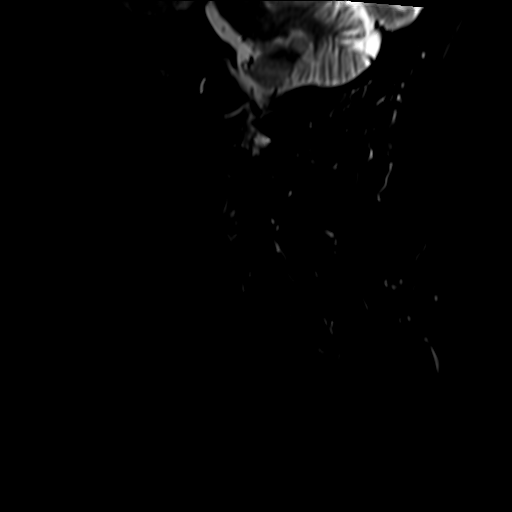
[im 16/16]
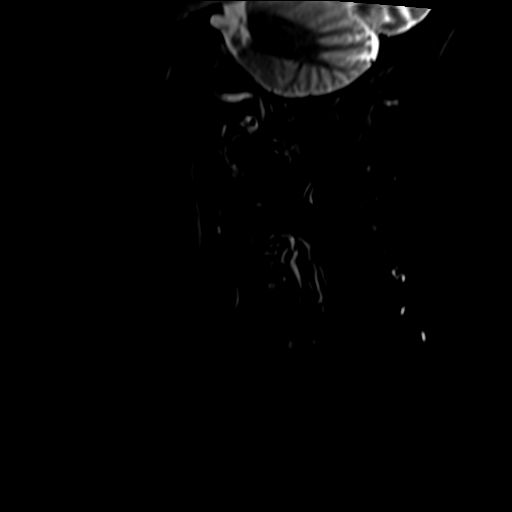

[Series 6: GRE · axial · 3.0mm · 0.35mm/px · 1 of 36 slices shown]
[im 1/36]
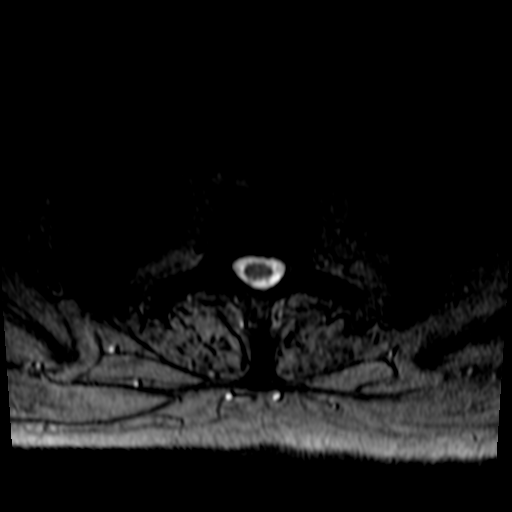

[Series 7: T2 · axial · 3.0mm · 0.70mm/px · z∈[-112,+17]mm · 8 of 36 slices shown (2 of 2)]
[im 1/36]
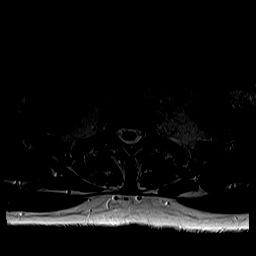
[im 6/36]
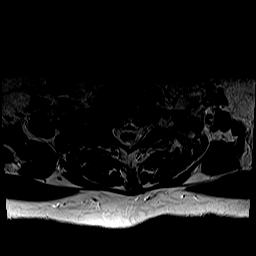
[im 11/36]
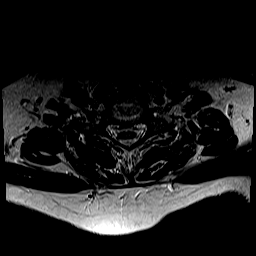
[im 17/36]
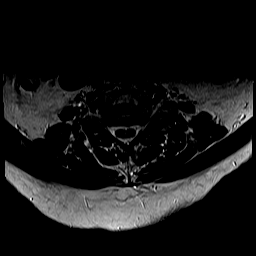
[im 19/36]
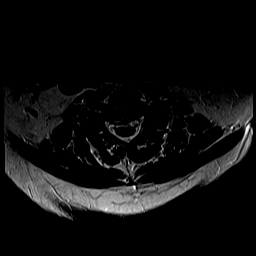
[im 25/36]
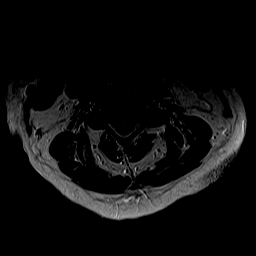
[im 30/36]
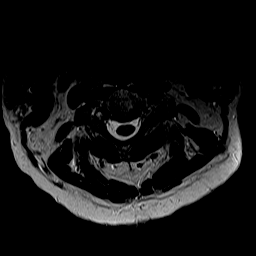
[im 36/36]
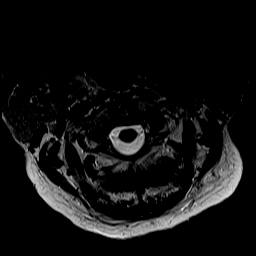

[29 of 48 positions shown; findings below may reference images not displayed]

FINDINGS: Alignment: Straightening of normal cervical lordosis. Grade 1
anterolisthesis at C7-T1.

Vertebrae: No fracture, evidence of discitis, or bone lesion.

Cord: Normal signal and morphology.

Posterior Fossa, vertebral arteries, paraspinal tissues: Negative.

Disc levels:

C1-C2: Normal.

C2-C3: Disc space narrowing without herniation. No spinal canal or
neural foraminal stenosis.

C3-C4: Intermediate sized disc bulge with bilateral uncovertebral
osteophytes. Mild spinal canal stenosis. Moderate right and severe
left foraminal stenosis.

C4-C5: Disc bulge with right-greater-than-left uncovertebral
osteophytes. Severe bilateral neural foraminal stenosis. No central
spinal canal stenosis.

C5-C6: Disc bulge with right-greater-than-left uncovertebral
hypertrophy. Bilateral facet hypertrophy. No spinal canal stenosis.
Moderate bilateral foraminal stenosis.

C6-C7: Small disc bulge bilateral facet hypertrophy. Bilateral
uncovertebral osteophytes. Mild right and moderate left foraminal
stenosis. No spinal canal stenosis.

C7-T1: Grade 1 anterolisthesis due to facet hypertrophy. Disc
uncovering. Severe bilateral foraminal stenosis. No spinal canal
stenosis.
IMPRESSION: 1. Multilevel cervical degenerative disc disease with mild spinal
canal stenosis at C3-C4.
2. Multilevel moderate-to-severe neural foraminal stenosis, worst at
C3-4, C4-5 and C7-T1.

## 2020-04-28 IMAGING — MR MR LUMBAR SPINE W/O CM
4 of 5 series · 25 of 48 positions shown · non-contrast
Comparison: None.

CLINICAL DATA: Low back pain and trauma

EXAM:
MRI LUMBAR SPINE WITHOUT CONTRAST
TECHNIQUE: Multiplanar, multisequence MR imaging of the lumbar spine was
performed. No intravenous contrast was administered.

[Series 4: T2 · sagittal · 4.0mm · 0.53mm/px · 6 of 19 slices shown (1 of 2)]
[im 1/19]
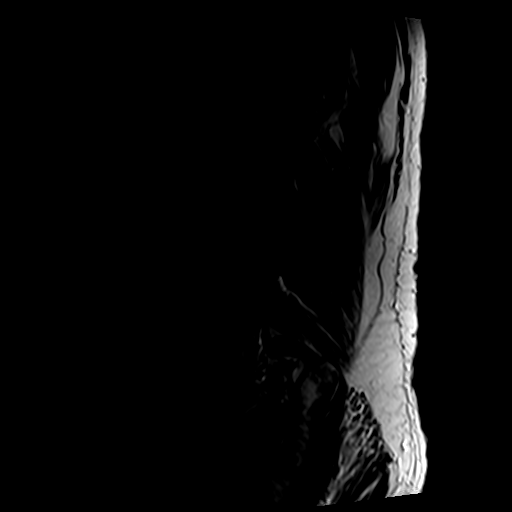
[im 4/19]
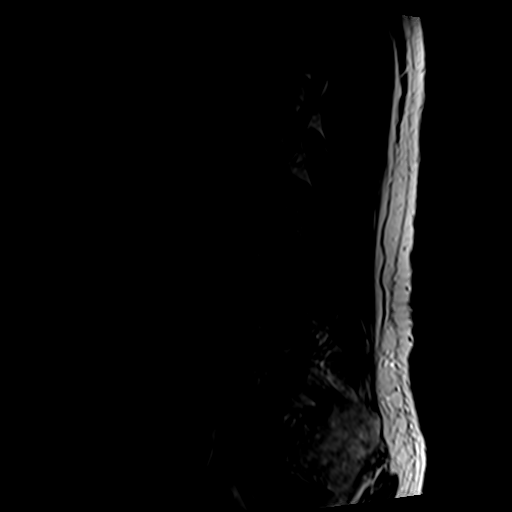
[im 8/19]
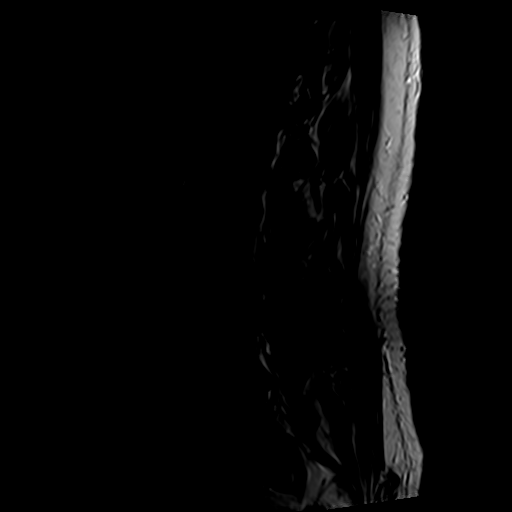
[im 11/19]
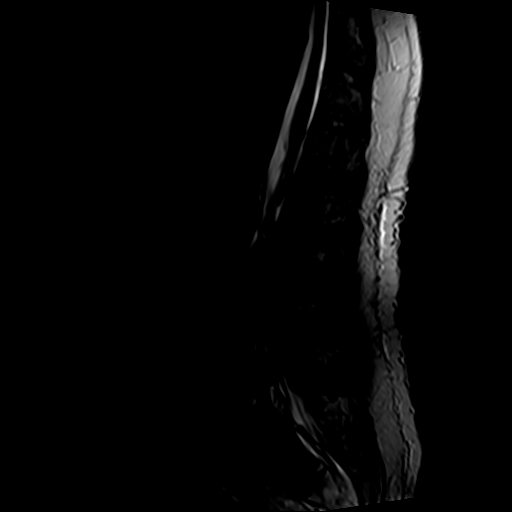
[im 15/19]
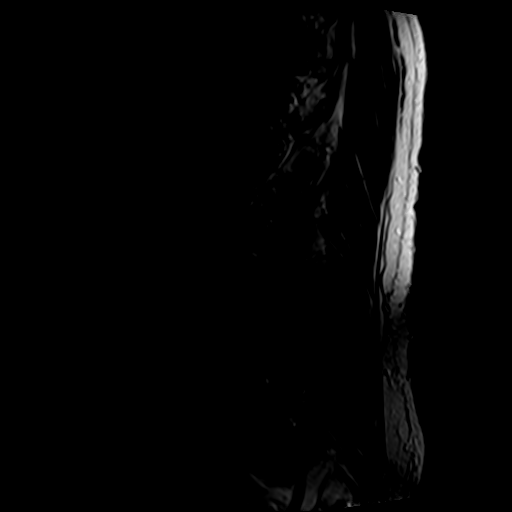
[im 19/19]
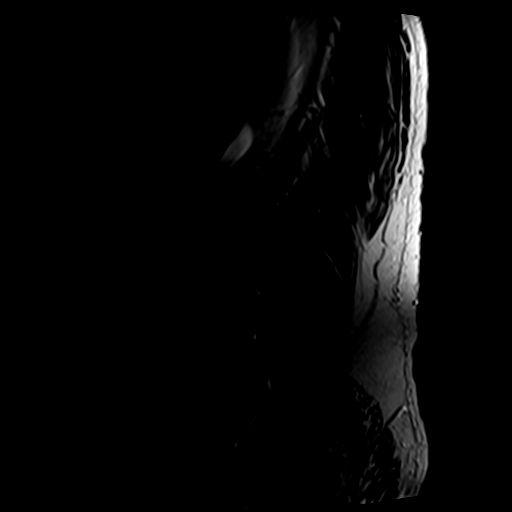

[Series 6: T1 · sagittal · 4.0mm · 0.53mm/px · 7 of 19 slices shown (1 of 2)]
[im 1/19]
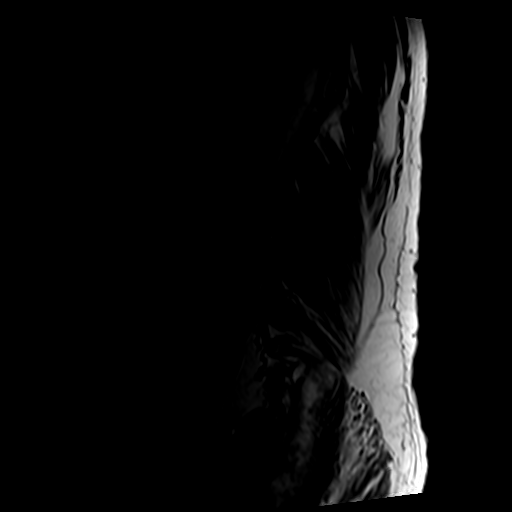
[im 4/19]
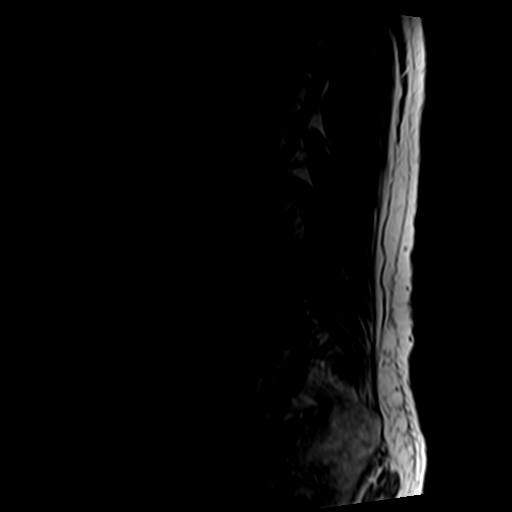
[im 7/19]
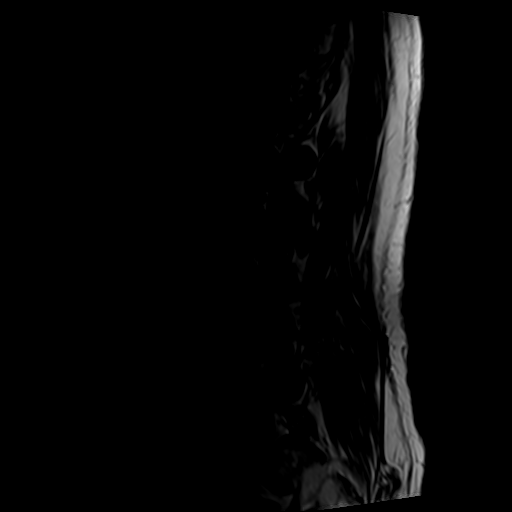
[im 10/19]
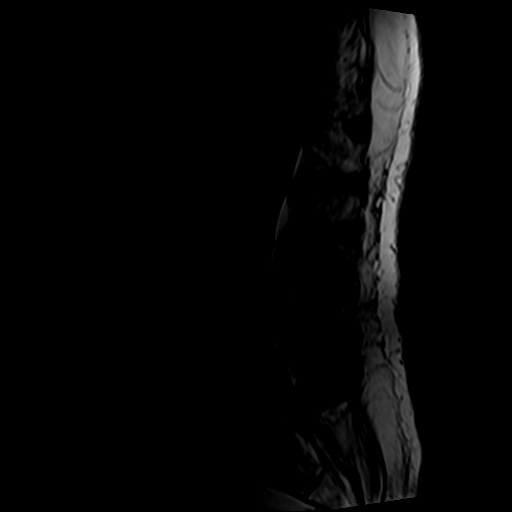
[im 13/19]
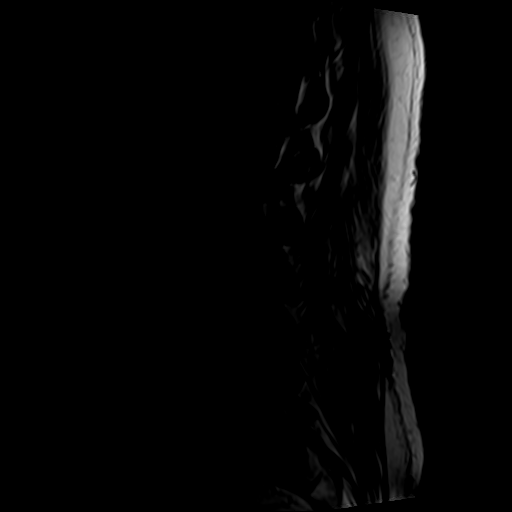
[im 16/19]
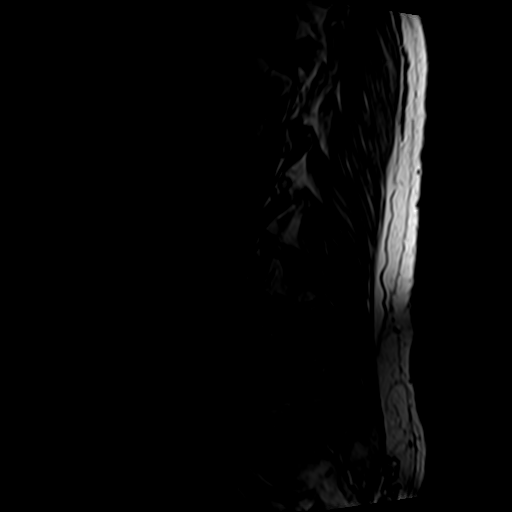
[im 19/19]
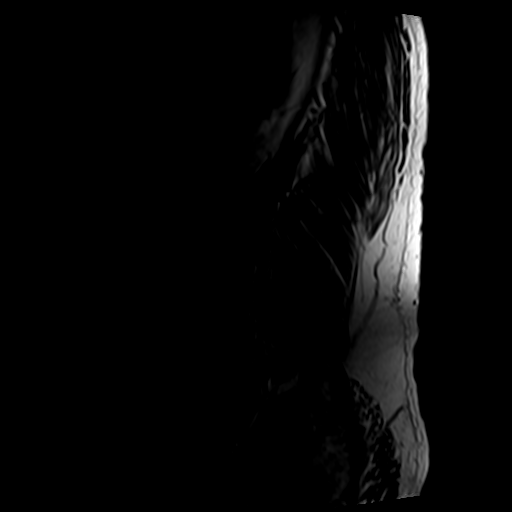

[Series 7: T2 · axial · 4.0mm · 0.70mm/px · z∈[-78,+123]mm · 8 of 39 slices shown (2 of 2)]
[im 1/39]
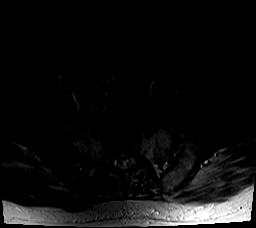
[im 6/39]
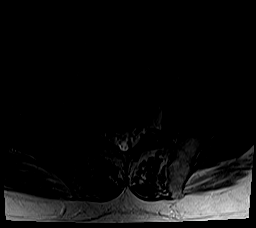
[im 12/39]
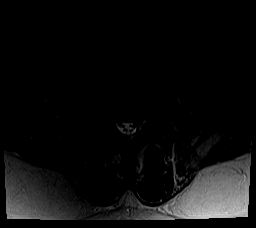
[im 18/39]
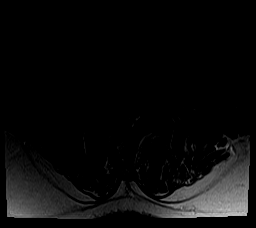
[im 21/39]
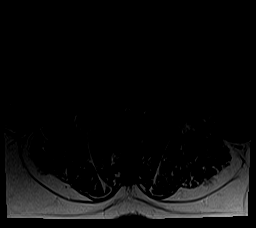
[im 27/39]
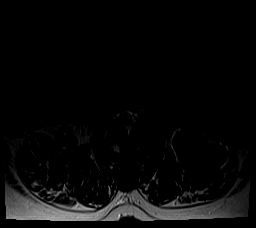
[im 33/39]
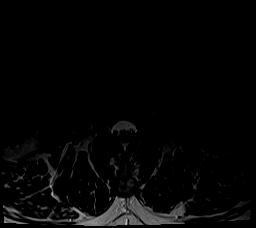
[im 39/39]
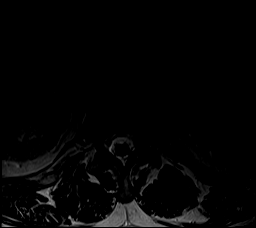

[Series 8: T1 · axial · 4.0mm · 0.35mm/px · z∈[-78,+92]mm · 4 of 39 slices shown (2 of 2)]
[im 1/39]
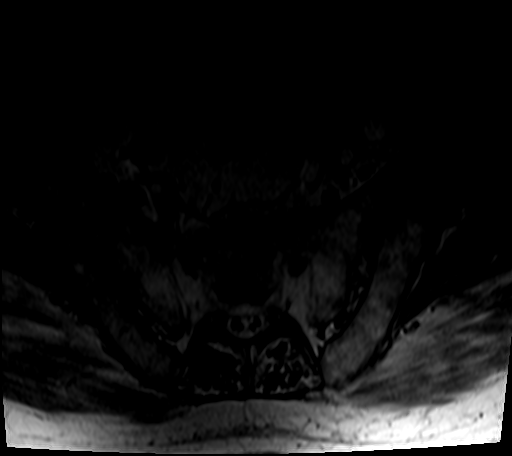
[im 6/39]
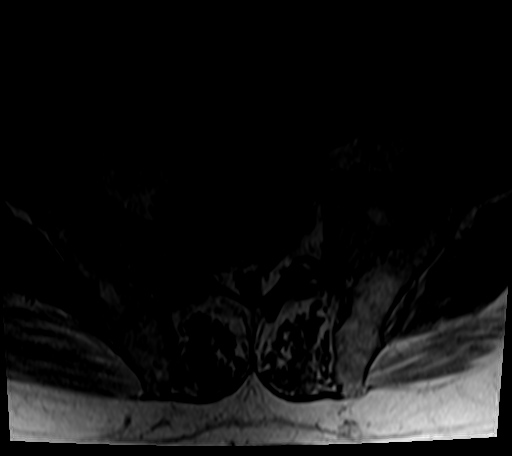
[im 21/39]
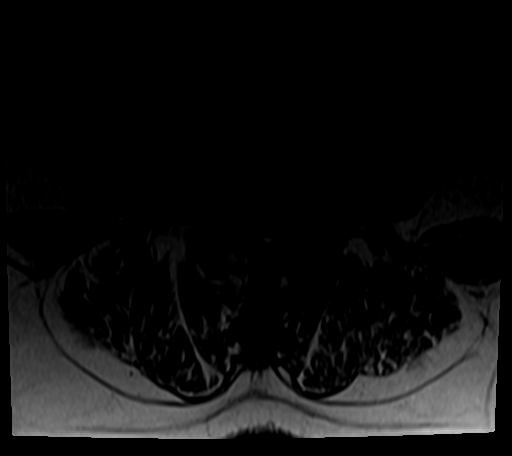
[im 33/39]
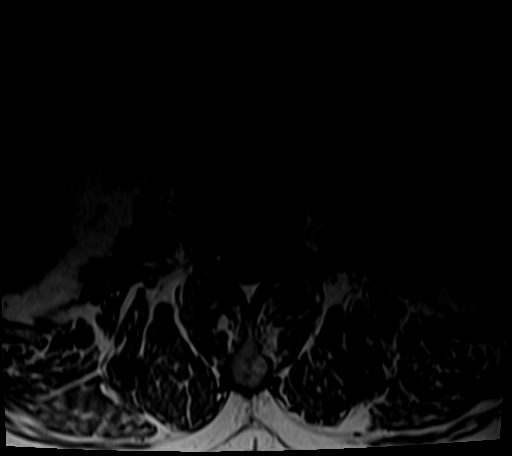

[25 of 48 positions shown; findings below may reference images not displayed]

FINDINGS: Segmentation:  Standard

Alignment:  Grade 1 retrolisthesis at L3-4.

Vertebrae:  No fracture, evidence of discitis, or bone lesion.

Conus medullaris and cauda equina: Conus extends to the L2 level.
Conus and cauda equina appear normal.

Paraspinal and other soft tissues: Negative.

Disc levels:

T12-L1: Normal disc space and facets. No spinal canal or
neuroforaminal stenosis.

L1-L2: Normal disc space and facets. No spinal canal or
neuroforaminal stenosis.

L2-L3: Normal disc space and facets. No spinal canal or
neuroforaminal stenosis.

L3-L4: Disc space narrowing with intermediate right asymmetric disc
bulge and endplate spurring. Mild central spinal canal stenosis.
Severe right neural foraminal stenosis.

L4-L5: Large left subarticular disc extrusion with superior
migration. Severe spinal canal stenosis. Moderate facet hypertrophy.
Mild bilateral neural foraminal stenosis.

L5-S1: Small disc bulge. Narrowing of both lateral recesses without
central spinal canal stenosis. No neural foraminal stenosis.

Visualized sacrum: Normal.
IMPRESSION: 1. Large left subarticular disc extrusion with superior migration at
L4-L5 causing severe spinal canal stenosis and mild bilateral neural
foraminal stenosis.
2. Mild L3-4 spinal canal stenosis and severe right neural foraminal
stenosis secondary to right asymmetric disc bulge and endplate
spurring.

## 2020-04-29 ENCOUNTER — Telehealth: Payer: Self-pay | Admitting: Neurology

## 2020-04-29 DIAGNOSIS — C61 Malignant neoplasm of prostate: Secondary | ICD-10-CM | POA: Diagnosis not present

## 2020-04-29 DIAGNOSIS — D1723 Benign lipomatous neoplasm of skin and subcutaneous tissue of right leg: Secondary | ICD-10-CM | POA: Diagnosis not present

## 2020-04-29 DIAGNOSIS — R3915 Urgency of urination: Secondary | ICD-10-CM | POA: Diagnosis not present

## 2020-04-29 NOTE — Telephone Encounter (Signed)
  IMPRESSION: This MRI of the cervical spine without contrast shows the following: 1.  There is degenerative fusion at C2-C3 but no foraminal narrowing or nerve root compression or spinal stenosis. 2.  At C3-C4, there is borderline spinal stenosis and moderately severe left foraminal narrowing with potential for left C4 nerve root compression. 3.  At C4-C5, there is borderline spinal stenosis and moderately severe right foraminal narrowing with potential for right C5 nerve root compression. 4.  At C5-C6, there is minimal anterolisthesis and other degenerative changes but no nerve root compression or spinal stenosis. 5.  At C6-C7, there is degenerative change but no nerve root compression or spinal stenosis. 6.  AtC7-T1, there is minimal anterolisthesis at the degenerative changes causing moderately severe foraminal narrowing bilaterally with potential for C8 nerve root compression on either side   IMPRESSION: This MRI of the lumbar spine without contrast shows the following: 1.   At L3-L4, there is moderately severe spinal stenosis (AP diameter 6.5 mm) due to degenerative changes.  There is moderate right foraminal narrowing and moderately severe right lateral recess stenosis with potential for right L4 nerve root compression. 2.   At L4-L5, there is severe spinal stenosis (AP diameter 4.8 mm) due to left paramedian disc protrusion and other degenerative changes.  There is moderately severe left lateral recess stenosis with potential for left L5 nerve root compression. 3.   At L5-S1, there is mild to moderate spinal stenosis but no nerve root compression.  Please call patient, MRI of the cervical spine showed multilevel degenerative changes, with variable degree of foraminal stenosis, no spinal cord compression  MRI of the lumbar spine showed moderately severe spinal stenosis at L3-4, severe spinal stenosis L4-5, with variable degree of foraminal narrowing

## 2020-04-29 NOTE — Telephone Encounter (Signed)
I spoke to the patient and provided him with the MRI results below. He has been scheduled to come in for further discussion of the findings on 05/01/20.

## 2020-04-29 NOTE — Telephone Encounter (Signed)
Give him a follow up visit with me at next follow up visit

## 2020-05-01 ENCOUNTER — Ambulatory Visit (INDEPENDENT_AMBULATORY_CARE_PROVIDER_SITE_OTHER): Payer: Medicare Other | Admitting: Neurology

## 2020-05-01 ENCOUNTER — Encounter: Payer: Self-pay | Admitting: Neurology

## 2020-05-01 ENCOUNTER — Other Ambulatory Visit: Payer: Self-pay

## 2020-05-01 VITALS — BP 138/90 | HR 88 | Ht 74.0 in | Wt 220.5 lb

## 2020-05-01 DIAGNOSIS — R1909 Other intra-abdominal and pelvic swelling, mass and lump: Secondary | ICD-10-CM | POA: Diagnosis not present

## 2020-05-01 DIAGNOSIS — R202 Paresthesia of skin: Secondary | ICD-10-CM | POA: Diagnosis not present

## 2020-05-01 DIAGNOSIS — M6281 Muscle weakness (generalized): Secondary | ICD-10-CM | POA: Diagnosis not present

## 2020-05-01 DIAGNOSIS — D181 Lymphangioma, any site: Secondary | ICD-10-CM | POA: Diagnosis not present

## 2020-05-01 DIAGNOSIS — M5441 Lumbago with sciatica, right side: Secondary | ICD-10-CM

## 2020-05-01 DIAGNOSIS — M4302 Spondylolysis, cervical region: Secondary | ICD-10-CM | POA: Diagnosis not present

## 2020-05-01 NOTE — Progress Notes (Addendum)
HISTORICAL  Anthony [name] is a 74 year old male, seen in request by his primary care physician Dr. Merrilee Seashore, for evaluation of bilateral upper extremity paresthesia, initial evaluation was on February 12, 2020,  I reviewed and summarized the referring note.  Past medical history -Hypertension, -Diabetes since 1990s, insulin-dependent since 2008,  -Anxiety, chronic insomnia, trazodone 50mg  qhs -Colon Cancer surgery in 2016, no chemo/radiation therapy -Abnormal LFTs-? statin treatment, off statin now, followed up by. Merrilee Seashore, MD  CT of abdomen with contrast in June 2020, marked hepatic steatosis, atrophic pancreas with multiple calcification, consistent with chronic pancreatitis, postsurgical change of the sigmoid colon, radioactive seeds in the prostate gland,Lumbar degenerative changes, partially imaging homogeneous ovoid mass in the subcutaneous soft tissue often anteromedial right upper thigh, consistent with a benign lesion such as seroma or lymphocele  Around April 2021, he noticed numbness involving left arm, also noticed left medial elbow sensitivity, radiating discomfort, later he noticed left hand muscle atrophy, weakness, difficulty making a tight grip, in June 2021, he noticed similar but milder involvement of right hand, right arm,  He had a history of diabetes peripheral neuropathy for more than 10 years, describes spongy sensation at the bottom of his feet, lower extremity paresthesia stated below he denies gait abnormality, he denies significant low back pain, denies bowel and bladder incontinence.  UPDATE Sept 15 2021: Return for electrodiagnostic study today, which showed evidence of mild length dependent diabetic peripheral neuropathy, chronic neuropathic changes involving bilateral lumbar sacral myotomes, left cervical myotomes more than right cervical myotomes.  The most bothersome symptoms for patient is left hand weakness, difficulty opening up a bottle, he  does has a long history of mild bilateral feet paresthesia from his diabetes, he denies significant gait abnormality, right arm weakness, no bulbar weakness  He had intermittent flareup of low back pain, sometimes radiating pain to right lower extremity  UPDATE May 01 2020: Personally reviewed MRI of cervical spine in October 2021, degenerative fusions at C2-3, multilevel degenerative changes, no significant canal stenosis, severe left C3-4, moderately severe right C4-5 foraminal stenosis,  MRI of lumbar spine multilevel degenerative changes, moderately severe stenosis at L3-4, severe spinal stenosis at L4-5, with moderately severe right lateral recess, foraminal stenosis  He has intermittent low back pain, radiating pain to bilateral lower extremity, the most bothersome symptoms in his left hand weakness, and numbness, he denies significant gait abnormality, no bowel and bladder incontinence,  REVIEW OF SYSTEMS: Full 14 system review of systems performed and notable only for as above All other review of systems were negative.  ALLERGIES: Allergies  Allergen Reactions  . Atorvastatin Other (See Comments)    Reports elevated liver enzymes.  . Other Other (See Comments)    Positive allergy test for peanuts and almonds  . Shellfish Allergy Other (See Comments)    Positive allergy test  . Toradol [Ketorolac Tromethamine] Other (See Comments)    Pt has chronic calcific pancreatitis  . Adhesive [Tape] Rash    Tegaderm     HOME MEDICATIONS: Current Outpatient Medications  Medication Sig Dispense Refill  . ACCU-CHEK COMPACT PLUS test strip   5  . amLODipine (NORVASC) 10 MG tablet Take 10 mg by mouth every morning.     Marland Kitchen aspirin 325 MG tablet Take 325 mg by mouth 2 (two) times a week. And takes as needed for headaches    . carboxymethylcellulose (REFRESH PLUS) 0.5 % SOLN Apply 1 drop to eye daily as needed (FOR EYE IRRITATION).     Marland Kitchen  celecoxib (CELEBREX) 200 MG capsule Take 200 mg by mouth  daily.    . Evolocumab (REPATHA) 140 MG/ML SOSY Inject 140 mg into the skin every 14 (fourteen) days.    . fosinopril (MONOPRIL) 10 MG tablet Take 10 mg by mouth daily.    . hydrochlorothiazide (MICROZIDE) 12.5 MG capsule Take 12.5 mg by mouth daily.     . insulin aspart (NOVOLOG) 100 UNIT/ML injection Inject 30-45 Units into the skin daily. Via pump    . pantoprazole (PROTONIX) 40 MG tablet Take 40 mg by mouth every morning.     . tamsulosin (FLOMAX) 0.4 MG CAPS capsule Take 1 capsule (0.4 mg total) by mouth daily as needed. For urinary urgency after prostate radiation. (Patient taking differently: Take 0.4 mg by mouth daily. ) 30 capsule 11  . traZODone (DESYREL) 50 MG tablet Take 50 mg by mouth at bedtime.     No current facility-administered medications for this visit.    PAST MEDICAL HISTORY: Past Medical History:  Diagnosis Date  . Agent orange exposure   . Anxiety   . At risk for sleep apnea    STOP BANG SCORE= 5             SENT TO PCP 11-24-2017  . Chronic calcific pancreatitis ([place]) 2000  . Colon cancer [facility])    09/ 2016  sigmoid cancer  s/p  left hemicolectomy (negative nodes and margins)  . Depression   . GERD (gastroesophageal reflux disease)   . Hemorrhoids   . History of adenomatous polyp of colon   . Hyperlipidemia   . Hyperplasia of prostate with lower urinary tract symptoms (LUTS)   . Hypertension   . Lipoma   . Peripheral neuropathy   . Prostate cancer [facility]) urologist-  dr Tresa Moore  oncologist-  dr Tammi Klippel   dx 08-02-2017-- Stage T1c, Gleason, 3+4, PSA 5.07, vol 38cc--  scheduled for radioactive seed implants 11-29-2017  . Type 2 diabetes mellitus with insulin therapy ([place])    Pt reports he is Type 1 Diabetic-diagnosed at age 74/  followed by pcp  . Wears glasses     PAST SURGICAL HISTORY: Past Surgical History:  Procedure Laterality Date  . COLONOSCOPY  last one 03-14-2015  . CYSTOSCOPY N/A 11/29/2017   Procedure: CYSTOSCOPY;  Surgeon: Alexis Frock, MD;   Location: [facility];  Service: Urology;  Laterality: N/A;  no seeds in bladder per Dr Tresa Moore  . EVALUATION UNDER ANESTHESIA WITH HEMORRHOIDECTOMY N/A 05/29/2015   Procedure: ANAL EXAM UNDER ANESTHESIA WITH  HEMORRHOIDOPEXY; RIGID SIGMOIDOSCOPY;  Surgeon: Leighton Ruff, MD;  Location: [facility];  Service: General;  Laterality: N/A;  . LAPAROSCOPIC PARTIAL COLECTOMY N/A 03/29/2015   Procedure: LAPAROSCOPIC SIGMOIDECTOMY;  Surgeon: Leighton Ruff, MD;  Location: [facility];  Service: General;  Laterality: N/A;  . NASAL SEPTUM SURGERY  2006  . PROSTATE BIOPSY  08-02-2017  dr Tresa Moore office  . RADIOACTIVE SEED IMPLANT N/A 11/29/2017   Procedure: RADIOACTIVE SEED IMPLANT/BRACHYTHERAPY IMPLANT;  Surgeon: Alexis Frock, MD;  Location: [facility];  Service: Urology;  Laterality: N/A;  . SPACE OAR INSTILLATION N/A 11/29/2017   Procedure: SPACE OAR INSTILLATION;  Surgeon: Alexis Frock, MD;  Location: [facility];  Service: Urology;  Laterality: N/A;    FAMILY HISTORY: Family History  Problem Relation Age of Onset  . Hyperlipidemia Father   . Stroke Father   . Prostate cancer Father   . Heart attack Father   . Diabetes Maternal Uncle   .  Cancer Brother        unknown blood cancer  . Healthy Mother     SOCIAL HISTORY: Social History   Socioeconomic History  . Marital status: Married    Spouse name: Not on file  . Number of children: 2  . Years of education: college  . Highest education level: Not on file  Occupational History  . Occupation: Retired  Tobacco Use  . Smoking status: Former Smoker    Years: 30.00    Types: Cigarettes    Quit date: 06/26/2009    Years since quitting: 10.8  . Smokeless tobacco: Never Used  Vaping Use  . Vaping Use: Never used  Substance and Sexual Activity  . Alcohol use: Not Currently  . Drug use: No  . Sexual activity: Not Currently  Other Topics Concern  . Not on file  Social History Narrative    Resides in [place], [place]. Lives with his wife. Believes he was exposed to agent orange in the [place] War.    Right-handed.   No daily caffeine use.   Social Determinants of Health   Financial Resource Strain:   . Difficulty of Paying Living Expenses: Not on file  Food Insecurity:   . Worried About Charity fundraiser in the Last Year: Not on file  . Ran Out of Food in the Last Year: Not on file  Transportation Needs:   . Lack of Transportation (Medical): Not on file  . Lack of Transportation (Non-Medical): Not on file  Physical Activity:   . Days of Exercise per Week: Not on file  . Minutes of Exercise per Session: Not on file  Stress:   . Feeling of Stress : Not on file  Social Connections:   . Frequency of Communication with Friends and Family: Not on file  . Frequency of Social Gatherings with Friends and Family: Not on file  . Attends Religious Services: Not on file  . Active Member of Clubs or Organizations: Not on file  . Attends Archivist Meetings: Not on file  . Marital Status: Not on file  Intimate Partner Violence:   . Fear of Current or Ex-Partner: Not on file  . Emotionally Abused: Not on file  . Physically Abused: Not on file  . Sexually Abused: Not on file     PHYSICAL EXAM   Vitals:   05/01/20 1448  BP: 138/90  Pulse: 88  Weight: 220 lb 8 oz (100 kg)  Height: 6\' 2"  (1.88 m)   Not recorded     Body mass index is 28.31 kg/m.  PHYSICAL EXAMNIATION:  Gen: NAD, conversant, well nourised, well groomed  NEUROLOGICAL EXAM:  MENTAL STATUS: Speech/cognition: Awake alert oriented to history taking care of conversation   CRANIAL NERVES: CN II: Visual fields are full to confrontation. Pupils are round equal and briskly reactive to light. CN III, IV, VI: extraocular movement are normal. No ptosis. CN V: Facial sensation is intact to light touch CN VII: Face is symmetric with normal eye closure  CN VIII: Hearing is normal to causal  conversation. CN IX, X: Phonation is normal. CN XI: Head turning and shoulder shrug are intact  MOTOR: Moderate atrophy of left hand intrinsic muscles, mild bilateral upper extremity proximal muscle weakness, mild to moderate left handgrip, finger abduction weakness, no significant right hand weakness, bilateral lower extremity motor strength was normal, left elbow Tinel's sign was present.  He has a large size right anterior/inner thigh lipoma, is under the care of  [place] [facility]  REFLEXES: Reflexes are 1 and symmetric at the biceps, triceps, knees, and absent ankles. Plantar responses are flexor.  SENSORY: Length dependent decreased light touch, pinprick, vibratory sensation to distal shin level  COORDINATION: There is no trunk or limb dysmetria noted.  GAIT/STANCE: Posture is normal.  DIAGNOSTIC DATA (LABS, IMAGING, TESTING) - I reviewed patient records, labs, notes, testing and imaging myself where available.   ASSESSMENT AND PLAN  Anthony [name] is a 74 y.o. male   Bilateral upper extremity paresthesia, left hand significant intrinsic muscle atrophy weakness., Evidence of diabetic peripheral neuropathy  EMG nerve conduction study today confirmed mild length dependent axonal peripheral neuropathy, but widespread chronic neuropathic changes involving bilateral lumbar sacral myotomes, left cervical radiculopathy   His left hand weakness, numbness, most consistent with left C7, 8 T1 radiculopathy,  I have discussed with patient and his wife, he does not want to consider surgery at this point     Marcial Pacas, M.D. Ph.D.  [facility] [address], [place], [place] [address] Ph: [phone] Fax: [phone]  CC:  Merrilee Seashore, [address] [place],  [place] [address]    I reviewed neurosurgical evaluation by Dr. Newman Pies May 31, 2020,   MRI of cervical spine was described as straightening and mild kyphotic  cervical spine with multilevel disc degeneration spondylosis, C7-T1 bilateral neural foraminal stenosis left greater than right,  MRI of lumbar moderate spinal stenosis L3-4, most severe at L4-5  Patient was offered C7-T1 anterior cervical discectomy first, with his neurogenic claudication of lumbar sacral spine, potential lumbar decompression later, patient was agreeable  Addendum, patient had C7-T1 anterior cervical discectomy, fusion, plating on June 26, 2020, recover well

## 2020-05-02 DIAGNOSIS — I10 Essential (primary) hypertension: Secondary | ICD-10-CM | POA: Diagnosis not present

## 2020-05-02 DIAGNOSIS — Z9641 Presence of insulin pump (external) (internal): Secondary | ICD-10-CM | POA: Diagnosis not present

## 2020-05-02 DIAGNOSIS — E782 Mixed hyperlipidemia: Secondary | ICD-10-CM | POA: Diagnosis not present

## 2020-05-02 DIAGNOSIS — E1165 Type 2 diabetes mellitus with hyperglycemia: Secondary | ICD-10-CM | POA: Diagnosis not present

## 2020-05-02 DIAGNOSIS — Z23 Encounter for immunization: Secondary | ICD-10-CM | POA: Diagnosis not present

## 2020-05-02 DIAGNOSIS — E10319 Type 1 diabetes mellitus with unspecified diabetic retinopathy without macular edema: Secondary | ICD-10-CM | POA: Diagnosis not present

## 2020-05-14 ENCOUNTER — Other Ambulatory Visit: Payer: Self-pay

## 2020-05-14 ENCOUNTER — Ambulatory Visit (INDEPENDENT_AMBULATORY_CARE_PROVIDER_SITE_OTHER): Payer: Medicare Other | Admitting: Ophthalmology

## 2020-05-14 ENCOUNTER — Encounter (INDEPENDENT_AMBULATORY_CARE_PROVIDER_SITE_OTHER): Payer: Self-pay | Admitting: Ophthalmology

## 2020-05-14 DIAGNOSIS — H3581 Retinal edema: Secondary | ICD-10-CM | POA: Diagnosis not present

## 2020-05-14 DIAGNOSIS — H35363 Drusen (degenerative) of macula, bilateral: Secondary | ICD-10-CM | POA: Diagnosis not present

## 2020-05-14 DIAGNOSIS — H31002 Unspecified chorioretinal scars, left eye: Secondary | ICD-10-CM

## 2020-05-14 DIAGNOSIS — I10 Essential (primary) hypertension: Secondary | ICD-10-CM

## 2020-05-14 DIAGNOSIS — E119 Type 2 diabetes mellitus without complications: Secondary | ICD-10-CM

## 2020-05-14 DIAGNOSIS — H35033 Hypertensive retinopathy, bilateral: Secondary | ICD-10-CM

## 2020-05-14 DIAGNOSIS — H353131 Nonexudative age-related macular degeneration, bilateral, early dry stage: Secondary | ICD-10-CM | POA: Diagnosis not present

## 2020-05-14 DIAGNOSIS — Z961 Presence of intraocular lens: Secondary | ICD-10-CM

## 2020-05-14 NOTE — Progress Notes (Signed)
[facility] Note  05/14/2020     CHIEF COMPLAINT Patient presents for Retina Follow Up   HISTORY OF PRESENT ILLNESS: Anthony [name] is a 74 y.o. male who presents to the clinic today for:   HPI    Retina Follow Up    Patient presents with  Other.  In left eye.  This started months ago.  Severity is moderate.  Duration of months.  Since onset it is stable.  I, the attending physician,  performed the HPI with the patient and updated documentation appropriately.          Comments    Pt states vision is still very good and has no complaints.  Patient denies eye pain or discomfort and denies any new or worsening floaters or fol OU.       Last edited by Bernarda Caffey, MD on 05/14/2020  3:45 PM. (History)       Referring physician: Hortencia Pilar, MD [place],  [place] [address]  HISTORICAL INFORMATION:   Selected notes from the MEDICAL RECORD NUMBER Referred by Dr. Kathlen Mody for cat sx clearance   CURRENT MEDICATIONS: Current Outpatient Medications (Ophthalmic Drugs)  Medication Sig  . carboxymethylcellulose (REFRESH PLUS) 0.5 % SOLN Apply 1 drop to eye daily as needed (FOR EYE IRRITATION).    No current facility-administered medications for this visit. (Ophthalmic Drugs)   Current Outpatient Medications (Other)  Medication Sig  . ACCU-CHEK COMPACT PLUS test strip   . amLODipine (NORVASC) 10 MG tablet Take 10 mg by mouth every morning.   Marland Kitchen aspirin 325 MG tablet Take 325 mg by mouth 2 (two) times a week. And takes as needed for headaches  . celecoxib (CELEBREX) 200 MG capsule Take 200 mg by mouth daily.  . Evolocumab (REPATHA) 140 MG/ML SOSY Inject 140 mg into the skin every 14 (fourteen) days.  . fosinopril (MONOPRIL) 10 MG tablet Take 10 mg by mouth daily.  . hydrochlorothiazide (MICROZIDE) 12.5 MG capsule Take 12.5 mg by mouth daily.   . insulin aspart (NOVOLOG) 100 UNIT/ML injection Inject 30-45 Units into the skin  daily. Via pump  . pantoprazole (PROTONIX) 40 MG tablet Take 40 mg by mouth every morning.   . tamsulosin (FLOMAX) 0.4 MG CAPS capsule Take 1 capsule (0.4 mg total) by mouth daily as needed. For urinary urgency after prostate radiation. (Patient taking differently: Take 0.4 mg by mouth daily. )  . traZODone (DESYREL) 50 MG tablet Take 50 mg by mouth at bedtime.   No current facility-administered medications for this visit. (Other)      REVIEW OF SYSTEMS: ROS    Positive for: Gastrointestinal, Endocrine, Eyes   Negative for: Constitutional, Neurological, Skin, Genitourinary, Musculoskeletal, HENT, Cardiovascular, Respiratory, Psychiatric, Allergic/Imm, Heme/Lymph   Last edited by Doneen Poisson on 05/14/2020  2:31 PM. (History)       ALLERGIES Allergies  Allergen Reactions  . Atorvastatin Other (See Comments)    Reports elevated liver enzymes.  . Other Other (See Comments)    Positive allergy test for peanuts and almonds  . Shellfish Allergy Other (See Comments)    Positive allergy test  . Toradol [Ketorolac Tromethamine] Other (See Comments)    Pt has chronic calcific pancreatitis  . Adhesive [Tape] Rash    Tegaderm     PAST MEDICAL HISTORY Past Medical History:  Diagnosis Date  . Agent orange exposure   . Anxiety   . At risk for sleep apnea  STOP BANG SCORE= 5             SENT TO PCP 11-24-2017  . Chronic calcific pancreatitis ([place]) 2000  . Colon cancer [facility])    09/ 2016  sigmoid cancer  s/p  left hemicolectomy (negative nodes and margins)  . Depression   . GERD (gastroesophageal reflux disease)   . Hemorrhoids   . History of adenomatous polyp of colon   . Hyperlipidemia   . Hyperplasia of prostate with lower urinary tract symptoms (LUTS)   . Hypertension   . Lipoma   . Peripheral neuropathy   . Prostate cancer [facility]) urologist-  dr Tresa Moore  oncologist-  dr Tammi Klippel   dx 08-02-2017-- Stage T1c, Gleason, 3+4, PSA 5.07, vol 38cc--  scheduled for radioactive seed  implants 11-29-2017  . Type 2 diabetes mellitus with insulin therapy ([place])    Pt reports he is Type 1 Diabetic-diagnosed at age 55/  followed by pcp  . Wears glasses    Past Surgical History:  Procedure Laterality Date  . COLONOSCOPY  last one 03-14-2015  . CYSTOSCOPY N/A 11/29/2017   Procedure: CYSTOSCOPY;  Surgeon: Alexis Frock, MD;  Location: [facility];  Service: Urology;  Laterality: N/A;  no seeds in bladder per Dr Tresa Moore  . EVALUATION UNDER ANESTHESIA WITH HEMORRHOIDECTOMY N/A 05/29/2015   Procedure: ANAL EXAM UNDER ANESTHESIA WITH  HEMORRHOIDOPEXY; RIGID SIGMOIDOSCOPY;  Surgeon: Leighton Ruff, MD;  Location: [place];  Service: General;  Laterality: N/A;  . LAPAROSCOPIC PARTIAL COLECTOMY N/A 03/29/2015   Procedure: LAPAROSCOPIC SIGMOIDECTOMY;  Surgeon: Leighton Ruff, MD;  Location: [facility];  Service: General;  Laterality: N/A;  . NASAL SEPTUM SURGERY  2006  . PROSTATE BIOPSY  08-02-2017  dr Tresa Moore office  . RADIOACTIVE SEED IMPLANT N/A 11/29/2017   Procedure: RADIOACTIVE SEED IMPLANT/BRACHYTHERAPY IMPLANT;  Surgeon: Alexis Frock, MD;  Location: [facility];  Service: Urology;  Laterality: N/A;  . SPACE OAR INSTILLATION N/A 11/29/2017   Procedure: SPACE OAR INSTILLATION;  Surgeon: Alexis Frock, MD;  Location: [facility];  Service: Urology;  Laterality: N/A;    FAMILY HISTORY Family History  Problem Relation Age of Onset  . Hyperlipidemia Father   . Stroke Father   . Prostate cancer Father   . Heart attack Father   . Diabetes Maternal Uncle   . Cancer Brother        unknown blood cancer  . Healthy Mother     SOCIAL HISTORY Social History   Tobacco Use  . Smoking status: Former Smoker    Years: 30.00    Types: Cigarettes    Quit date: 06/26/2009    Years since quitting: 10.8  . Smokeless tobacco: Never Used  Vaping Use  . Vaping Use: Never used  Substance Use Topics  . Alcohol use: Not Currently  . Drug  use: No         OPHTHALMIC EXAM:  Base Eye Exam    Visual Acuity (Snellen - Linear)      Right Left   Dist [place] 20/20 -1 20/20 -2       Tonometry (Tonopen, 2:33 PM)      Right Left   Pressure 13 12       Pupils      Dark Light Shape React APD   Right 3 2 Round Minimal 0   Left 3 2 Round Minimal 0       Visual Fields      Left Right  Full Full       Extraocular Movement      Right Left    Full Full       Neuro/Psych    Oriented x3: Yes   Mood/Affect: Normal       Dilation    Both eyes: 1.0% Mydriacyl, 2.5% Phenylephrine @ 2:33 PM        Slit Lamp and Fundus Exam    Slit Lamp Exam      Right Left   Lids/Lashes Dermatochalasis - upper lid, Ptosis Dermatochalasis - upper lid, mild Ptosis   Conjunctiva/Sclera Mild Melanosis, inferior scleral show Melanosis, temporal pinguecula, inferior scleral show   Cornea Arcus, well healed cataract wounds Arcus, well healed cataract wounds   Anterior Chamber deep and clear deep and clear   Iris Round and moderately Round and moderately dilated   Lens PC IOL in excellent position PC IOL in excellent poistion   Vitreous Vitreous syneresis Vitreous syneresis       Fundus Exam      Right Left   Disc Pink and Sharp Pink and Sharp, +cupping and central pallor   C/D Ratio 0.6 0.7   Macula Blunted foveal reflex, Retinal pigment epithelial mottling, scattered Drusen, No heme.  Focal shallow SRF superior macula - stable from prior Blunted foveal reflex, Retinal pigment epithelial mottling and clumping, Drusen, No heme or edema, RPE atrophy and mild ERM   Vessels Vascular attenuation, mild Copper wiring, mild Tortuousity Vascular attenuation, mild Copper wiring   Periphery Attached, scattered RPE changes, peripheral drusen, No heme  Attached, focal patch of CR scarring just outside IT arcades and inferiorly--stable, scattered peripheral drusen, No heme         Refraction    Wearing Rx      Sphere Cylinder   Right +3.00 Sphere    Left +3.25 Sphere   Type: SVL          IMAGING AND PROCEDURES  Imaging and Procedures for @TODAY @  OCT, Retina - OU - Both Eyes       Right Eye Quality was good. Central Foveal Thickness: 264. Progression has been stable. Findings include no IRF, retinal drusen , vitreomacular adhesion , normal foveal contour, pigment epithelial detachment, subretinal fluid (Persistent focal patch of SRF superior to fovea--stable, scattered small PED's,  Mild scattered drusen, Focal ellipsoid disruption nasal macula--stable).   Left Eye Quality was good. Central Foveal Thickness: 250. Progression has been stable. Findings include normal foveal contour, no IRF, no SRF, vitreomacular adhesion , pigment epithelial detachment, outer retinal atrophy (Focal ellipsoid disruption IN and temporal macula, larger geographic area of inner and outer retinal atrophy IT midzone caught on widefield, rare drusen, focal low lying PED temporal macula -- stable from prior).   Notes *Images captured and stored on drive  Diagnosis / Impression:  NFP, no IRF OU +retinal drusen OU -- non-exudative ARMD OD: focal patch of SRF superior to fovea--persistent, stable; scattered small PED's,  Mild scattered drusen, Focal ellipsoid disruption nasal macula--stable OS: Focal ellipsoid disruption IN and temporal macula, larger geographic area of inner and outer retinal atrophy IT midzone caught on widefield, rare drusen, focal low lying PED temporal macula -- stable from prior  Clinical management:  See below  Abbreviations: NFP - Normal foveal profile. CME - cystoid macular edema. PED - pigment epithelial detachment. IRF - intraretinal fluid. SRF - subretinal fluid. EZ - ellipsoid zone. ERM - epiretinal membrane. ORA - outer retinal atrophy. ORT - outer retinal tubulation. SRHM -  subretinal hyper-reflective material        Fluorescein Angiography Optos (Transit OD)       Right Eye   Progression has been stable. Early  phase findings include staining (Delayed venous return). Mid/Late phase findings include staining, leakage (Focal staining / window defect superior and nasal to fovea, ?CNVM superior macula).   Left Eye   Progression has been stable. Early phase findings include staining, window defect. Mid/Late phase findings include staining, leakage, window defect.   Notes **Images stored on drive**  Impression: Scattered staining / window defect corresponding to RPE changes and CR scarring OU (OS > OD) +CNVM superior macula OD -- mild leakage                  ASSESSMENT/PLAN:    ICD-10-CM   1. Chorioretinal scar of left eye  H31.002   2. Early dry stage nonexudative age-related macular degeneration of both eyes  H35.3131   3. Retinal drusen of both eyes  H35.363   4. Retinal edema  H35.81 OCT, Retina - OU - Both Eyes  5. Diabetes mellitus type 2 without retinopathy ([place])  E11.9   6. Essential hypertension  I10   7. Hypertensive retinopathy of both eyes  H35.033 Fluorescein Angiography Optos (Transit OD)  8. Pseudophakia of both eyes  Z96.1     1 Chorioretinal Scarring OS -- stable  - geographic patch of pigmented CR atrophy just outside IT arcades  - scattered patches of outer retinal atrophy  - per records, likely chronic and present on earliest exams in 2014 w/ Dr. Herbert Deaner  - no intervention indicated  - monitor  - f/u 3 months  2-4. Age related macular degeneration, non-exudative, both eyes  - early dry stage  - BCVA improved post cataract surgery OU to 20/20 OU  - OCT shows persistent focal SRF OD, scattered small PEDs OU -- ?CSCR component  - FA 10.19.21 shows CNV superior macula corresponding to focal SRF OD  - discussed findings  - no intervention recommended as CNV remains minimally active, BCVA remains 20/20 and pt asymptomatic  - The incidence, anatomy, and pathology of dry AMD, risk of progression, and the AREDS and AREDS 2 study including smoking risks discussed with  patient.  - continue amsler grid monitoring  - f/u 3 mos, sooner prn for DFE/OCT  5. Diabetes mellitus, type 2 without retinopathy  - The incidence, risk factors for progression, natural history and treatment options for diabetic retinopathy  were discussed with patient.    - The need for close monitoring of blood glucose, blood pressure, and serum lipids, avoiding cigarette or any type of tobacco, and the need for long term follow up was also discussed with patient.  - f/u in 1 year, sooner prn  6,7. Hypertensive retinopathy OU  - discussed importance of tight BP control  - monitor  8. Pseudophakia OU  - s/p CEIOL OD on 12.14.20 and OS on 12.28.20 with Dr. Kathlen Mody.  - beautiful surgeries w/ uncorrected VA 20/20 OU  - pt extremely happy   Ophthalmic Meds Ordered this visit:  No orders of the defined types were placed in this encounter.      Return in about 3 months (around 08/14/2020) for f/u 3 months CR scarring OS, DFE, OCT.  There are no Patient Instructions on file for this visit.   Explained the diagnoses, plan, and follow up with the patient and they expressed understanding.  Patient expressed understanding of the importance of proper follow up  care.   This document serves as a record of services personally performed by Gardiner Sleeper, MD, PhD. It was created on their behalf by Estill Bakes, COT an ophthalmic technician. The creation of this record is the provider's dictation and/or activities during the visit.    Electronically signed by: Estill Bakes, COT 10.19.21 @ 7:55 PM   This document serves as a record of services personally performed by Gardiner Sleeper, MD, PhD. It was created on their behalf by San Jetty. Owens Shark, OA an ophthalmic technician. The creation of this record is the provider's dictation and/or activities during the visit.    Electronically signed by: San Jetty. Owens Shark, [place] 10.19.2021 7:55 PM  Gardiner Sleeper, M.D., Ph.D. Diseases & Surgery of the Retina  and [place] 05/14/2020   I have reviewed the above documentation for accuracy and completeness, and I agree with the above. Gardiner Sleeper, M.D., Ph.D. 05/14/20 7:55 PM   Abbreviations: M myopia (nearsighted); A astigmatism; H hyperopia (farsighted); P presbyopia; Mrx spectacle prescription;  CTL contact lenses; OD right eye; OS left eye; OU both eyes  XT exotropia; ET esotropia; PEK punctate epithelial keratitis; PEE punctate epithelial erosions; DES dry eye syndrome; MGD meibomian gland dysfunction; ATs artificial tears; PFAT's preservative free artificial tears; [place] nuclear sclerotic cataract; PSC posterior subcapsular cataract; ERM epi-retinal membrane; PVD posterior vitreous detachment; RD retinal detachment; DM diabetes mellitus; DR diabetic retinopathy; NPDR non-proliferative diabetic retinopathy; PDR proliferative diabetic retinopathy; CSME clinically significant macular edema; DME diabetic macular edema; dbh dot blot hemorrhages; CWS cotton wool spot; POAG primary open angle glaucoma; C/D cup-to-disc ratio; HVF humphrey visual field; GVF goldmann visual field; OCT optical coherence tomography; IOP intraocular pressure; BRVO Branch retinal vein occlusion; CRVO central retinal vein occlusion; CRAO central retinal artery occlusion; BRAO branch retinal artery occlusion; RT retinal tear; SB scleral buckle; PPV pars plana vitrectomy; VH Vitreous hemorrhage; PRP panretinal laser photocoagulation; IVK intravitreal kenalog; VMT vitreomacular traction; MH Macular hole;  NVD neovascularization of the disc; NVE neovascularization elsewhere; AREDS age related eye disease study; ARMD age related macular degeneration; POAG primary open angle glaucoma; EBMD epithelial/anterior basement membrane dystrophy; ACIOL anterior chamber intraocular lens; IOL intraocular lens; PCIOL posterior chamber intraocular lens; Phaco/IOL phacoemulsification with intraocular lens placement; [place]  photorefractive keratectomy; LASIK laser assisted in situ keratomileusis; HTN hypertension; DM diabetes mellitus; COPD chronic obstructive pulmonary disease

## 2020-05-15 DIAGNOSIS — I10 Essential (primary) hypertension: Secondary | ICD-10-CM | POA: Diagnosis not present

## 2020-05-15 DIAGNOSIS — Z23 Encounter for immunization: Secondary | ICD-10-CM | POA: Diagnosis not present

## 2020-05-15 DIAGNOSIS — E1142 Type 2 diabetes mellitus with diabetic polyneuropathy: Secondary | ICD-10-CM | POA: Diagnosis not present

## 2020-05-15 DIAGNOSIS — E782 Mixed hyperlipidemia: Secondary | ICD-10-CM | POA: Diagnosis not present

## 2020-05-15 DIAGNOSIS — E1165 Type 2 diabetes mellitus with hyperglycemia: Secondary | ICD-10-CM | POA: Diagnosis not present

## 2020-05-22 DIAGNOSIS — E782 Mixed hyperlipidemia: Secondary | ICD-10-CM | POA: Diagnosis not present

## 2020-05-22 DIAGNOSIS — E10319 Type 1 diabetes mellitus with unspecified diabetic retinopathy without macular edema: Secondary | ICD-10-CM | POA: Diagnosis not present

## 2020-05-22 DIAGNOSIS — E1142 Type 2 diabetes mellitus with diabetic polyneuropathy: Secondary | ICD-10-CM | POA: Diagnosis not present

## 2020-05-22 DIAGNOSIS — G629 Polyneuropathy, unspecified: Secondary | ICD-10-CM | POA: Diagnosis not present

## 2020-05-22 DIAGNOSIS — M5412 Radiculopathy, cervical region: Secondary | ICD-10-CM | POA: Diagnosis not present

## 2020-05-22 DIAGNOSIS — I10 Essential (primary) hypertension: Secondary | ICD-10-CM | POA: Diagnosis not present

## 2020-05-31 DIAGNOSIS — M4712 Other spondylosis with myelopathy, cervical region: Secondary | ICD-10-CM | POA: Diagnosis not present

## 2020-05-31 DIAGNOSIS — M4313 Spondylolisthesis, cervicothoracic region: Secondary | ICD-10-CM | POA: Diagnosis not present

## 2020-05-31 DIAGNOSIS — M48062 Spinal stenosis, lumbar region with neurogenic claudication: Secondary | ICD-10-CM | POA: Diagnosis not present

## 2020-06-26 DIAGNOSIS — M4712 Other spondylosis with myelopathy, cervical region: Secondary | ICD-10-CM | POA: Diagnosis not present

## 2020-06-26 DIAGNOSIS — M4723 Other spondylosis with radiculopathy, cervicothoracic region: Secondary | ICD-10-CM | POA: Diagnosis not present

## 2020-07-16 DIAGNOSIS — M4802 Spinal stenosis, cervical region: Secondary | ICD-10-CM | POA: Diagnosis not present

## 2020-07-16 DIAGNOSIS — M4712 Other spondylosis with myelopathy, cervical region: Secondary | ICD-10-CM | POA: Diagnosis not present

## 2020-08-01 DIAGNOSIS — I10 Essential (primary) hypertension: Secondary | ICD-10-CM | POA: Diagnosis not present

## 2020-08-01 DIAGNOSIS — E1165 Type 2 diabetes mellitus with hyperglycemia: Secondary | ICD-10-CM | POA: Diagnosis not present

## 2020-08-13 NOTE — Progress Notes (Signed)
[facility] Note  08/14/2020     CHIEF COMPLAINT Patient presents for Retina Follow Up   HISTORY OF PRESENT ILLNESS: Anthony [name] is a 75 y.o. male who presents to the clinic today for:   HPI    Retina Follow Up    Patient presents with  Dry AMD (Chorioretinal scar).  In left eye.  Severity is moderate.  Duration of 3 months.  Since onset it is stable.  I, the attending physician,  performed the HPI with the patient and updated documentation appropriately.          Comments    Patient states vision the same OU. BS was 11 this a.m. Last a1c was 7.2, checked last week.        Last edited by Bernarda Caffey, MD on 08/15/2020 10:28 PM. (History)     pt states vision is good  Referring physician: Merrilee Seashore, [address],  [place] [id number]  HISTORICAL INFORMATION:   Selected notes from the MEDICAL RECORD NUMBER Referred by Dr. Kathlen Mody for cat sx clearance   CURRENT MEDICATIONS: Current Outpatient Medications (Ophthalmic Drugs)  Medication Sig  . carboxymethylcellulose (REFRESH PLUS) 0.5 % SOLN Apply 1 drop to eye daily as needed (FOR EYE IRRITATION).    No current facility-administered medications for this visit. (Ophthalmic Drugs)   Current Outpatient Medications (Other)  Medication Sig  . ACCU-CHEK COMPACT PLUS test strip   . amLODipine (NORVASC) 10 MG tablet Take 10 mg by mouth every morning.   Marland Kitchen aspirin 325 MG tablet Take 325 mg by mouth 2 (two) times a week. And takes as needed for headaches  . celecoxib (CELEBREX) 200 MG capsule Take 200 mg by mouth daily.  . Evolocumab (REPATHA) 140 MG/ML SOSY Inject 140 mg into the skin every 14 (fourteen) days.  . fosinopril (MONOPRIL) 10 MG tablet Take 10 mg by mouth daily.  . hydrochlorothiazide (MICROZIDE) 12.5 MG capsule Take 12.5 mg by mouth daily.   . insulin aspart (NOVOLOG) 100 UNIT/ML injection Inject 30-45 Units into the skin daily. Via pump  . pantoprazole  (PROTONIX) 40 MG tablet Take 40 mg by mouth every morning.   . tamsulosin (FLOMAX) 0.4 MG CAPS capsule Take 1 capsule (0.4 mg total) by mouth daily as needed. For urinary urgency after prostate radiation. (Patient taking differently: Take 0.4 mg by mouth daily.)  . traZODone (DESYREL) 50 MG tablet Take 50 mg by mouth at bedtime.   No current facility-administered medications for this visit. (Other)      REVIEW OF SYSTEMS: ROS    Positive for: Gastrointestinal, Endocrine, Eyes   Negative for: Constitutional, Neurological, Skin, Genitourinary, Musculoskeletal, HENT, Cardiovascular, Respiratory, Psychiatric, Allergic/Imm, Heme/Lymph   Last edited by Roselee Nova D, COT on 08/14/2020  2:04 PM. (History)       ALLERGIES Allergies  Allergen Reactions  . Atorvastatin Other (See Comments)    Reports elevated liver enzymes.  . Other Other (See Comments)    Positive allergy test for peanuts and almonds  . Shellfish Allergy Other (See Comments)    Positive allergy test  . Toradol [Ketorolac Tromethamine] Other (See Comments)    Pt has chronic calcific pancreatitis  . Adhesive [Tape] Rash    Tegaderm     PAST MEDICAL HISTORY Past Medical History:  Diagnosis Date  . Agent orange exposure   . Anxiety   . At risk for sleep apnea    STOP BANG SCORE= 5  SENT TO PCP 11-24-2017  . Chronic calcific pancreatitis ([place]) 2000  . Colon cancer [facility])    09/ 2016  sigmoid cancer  s/p  left hemicolectomy (negative nodes and margins)  . Depression   . GERD (gastroesophageal reflux disease)   . Hemorrhoids   . History of adenomatous polyp of colon   . Hyperlipidemia   . Hyperplasia of prostate with lower urinary tract symptoms (LUTS)   . Hypertension   . Lipoma   . Peripheral neuropathy   . Prostate cancer [facility]) urologist-  dr Tresa Moore  oncologist-  dr Tammi Klippel   dx 08-02-2017-- Stage T1c, Gleason, 3+4, PSA 5.07, vol 38cc--  scheduled for radioactive seed implants 11-29-2017  . Type 2  diabetes mellitus with insulin therapy ([place])    Pt reports he is Type 1 Diabetic-diagnosed at age 19/  followed by pcp  . Wears glasses    Past Surgical History:  Procedure Laterality Date  . COLONOSCOPY  last one 03-14-2015  . CYSTOSCOPY N/A 11/29/2017   Procedure: CYSTOSCOPY;  Surgeon: Alexis Frock, MD;  Location: [facility];  Service: Urology;  Laterality: N/A;  no seeds in bladder per Dr Tresa Moore  . EVALUATION UNDER ANESTHESIA WITH HEMORRHOIDECTOMY N/A 05/29/2015   Procedure: ANAL EXAM UNDER ANESTHESIA WITH  HEMORRHOIDOPEXY; RIGID SIGMOIDOSCOPY;  Surgeon: Leighton Ruff, MD;  Location: [place];  Service: General;  Laterality: N/A;  . LAPAROSCOPIC PARTIAL COLECTOMY N/A 03/29/2015   Procedure: LAPAROSCOPIC SIGMOIDECTOMY;  Surgeon: Leighton Ruff, MD;  Location: [facility];  Service: General;  Laterality: N/A;  . NASAL SEPTUM SURGERY  2006  . PROSTATE BIOPSY  08-02-2017  dr Tresa Moore office  . RADIOACTIVE SEED IMPLANT N/A 11/29/2017   Procedure: RADIOACTIVE SEED IMPLANT/BRACHYTHERAPY IMPLANT;  Surgeon: Alexis Frock, MD;  Location: [facility];  Service: Urology;  Laterality: N/A;  . SPACE OAR INSTILLATION N/A 11/29/2017   Procedure: SPACE OAR INSTILLATION;  Surgeon: Alexis Frock, MD;  Location: [facility];  Service: Urology;  Laterality: N/A;    FAMILY HISTORY Family History  Problem Relation Age of Onset  . Hyperlipidemia Father   . Stroke Father   . Prostate cancer Father   . Heart attack Father   . Diabetes Maternal Uncle   . Cancer Brother        unknown blood cancer  . Healthy Mother     SOCIAL HISTORY Social History   Tobacco Use  . Smoking status: Former Smoker    Years: 30.00    Types: Cigarettes    Quit date: 06/26/2009    Years since quitting: 11.1  . Smokeless tobacco: Never Used  Vaping Use  . Vaping Use: Never used  Substance Use Topics  . Alcohol use: Not Currently  . Drug use: No          OPHTHALMIC EXAM:  Base Eye Exam    Visual Acuity (Snellen - Linear)      Right Left   Dist [place] 20/20 20/20       Tonometry (Tonopen, 2:11 PM)      Right Left   Pressure 15 14       Pupils      Dark Light Shape React APD   Right 3 2 Round Minimal None   Left 3 2 Round Minimal None       Visual Fields (Counting fingers)      Left Right    Full Full       Extraocular Movement  Right Left    Full, Ortho Full, Ortho       Neuro/Psych    Oriented x3: Yes   Mood/Affect: Normal       Dilation    Both eyes: 1.0% Mydriacyl, 2.5% Phenylephrine @ 2:11 PM        Slit Lamp and Fundus Exam    Slit Lamp Exam      Right Left   Lids/Lashes Dermatochalasis - upper lid, Ptosis Dermatochalasis - upper lid, mild Ptosis   Conjunctiva/Sclera Mild Melanosis, inferior scleral show Melanosis, temporal pinguecula, inferior scleral show   Cornea Arcus, well healed cataract wounds Arcus, well healed cataract wounds   Anterior Chamber deep and clear deep and clear   Iris Round and moderately dilated Round and moderately dilated   Lens PC IOL in excellent position PC IOL in excellent poistion   Vitreous Vitreous syneresis Vitreous syneresis       Fundus Exam      Right Left   Disc Pink and Sharp Pink and Sharp, +cupping and central pallor   C/D Ratio 0.6 0.7   Macula Blunted foveal reflex, Retinal pigment epithelial mottling, scattered Drusen, No heme.  Focal shallow SRF superior macula - stable from prior Blunted foveal reflex, Retinal pigment epithelial mottling and clumping, Drusen, No heme or edema, RPE atrophy and mild ERM   Vessels Vascular attenuation, mild Copper wiring, mild Tortuousity, AV crossing changes Vascular attenuation, mild Copper wiring, Tortuous, mild AV crossing changes   Periphery Attached, scattered RPE changes, peripheral drusen, No heme  Attached, focal patch of CR scarring just outside IT arcades and inferiorly -- stable, scattered peripheral drusen, No heme            IMAGING AND PROCEDURES  Imaging and Procedures for @TODAY @  OCT, Retina - OU - Both Eyes       Right Eye Quality was good. Central Foveal Thickness: 257. Progression has been stable. Findings include no IRF, retinal drusen , vitreomacular adhesion , normal foveal contour, pigment epithelial detachment, subretinal fluid (Persistent focal patch of SRF superior to fovea--stable, scattered small PED's,  Mild scattered drusen, Focal ellipsoid disruption nasal macula -- stable).   Left Eye Quality was good. Central Foveal Thickness: 250. Progression has been stable. Findings include normal foveal contour, no IRF, no SRF, vitreomacular adhesion , pigment epithelial detachment, outer retinal atrophy, retinal drusen  (Focal ellipsoid disruption IN and temporal macula, larger geographic area of inner and outer retinal atrophy IT midzone caught on widefield, rare drusen, focal low lying PED temporal macula -- stable from prior).   Notes *Images captured and stored on drive  Diagnosis / Impression:  NFP, no IRF OU +retinal drusen OU -- non-exudative ARMD OD: focal patch of SRF superior to fovea--persistent, stable; scattered small PED's,  Mild scattered drusen, Focal ellipsoid disruption nasal macula--stable OS: Focal ellipsoid disruption IN and temporal macula, larger geographic area of inner and outer retinal atrophy IT midzone caught on widefield, rare drusen, focal low lying PED temporal macula -- stable from prior  Clinical management:  See below  Abbreviations: NFP - Normal foveal profile. CME - cystoid macular edema. PED - pigment epithelial detachment. IRF - intraretinal fluid. SRF - subretinal fluid. EZ - ellipsoid zone. ERM - epiretinal membrane. ORA - outer retinal atrophy. ORT - outer retinal tubulation. SRHM - subretinal hyper-reflective material                 ASSESSMENT/PLAN:    ICD-10-CM   1. Chorioretinal scar of left  eye  H31.002   2. Early dry stage  nonexudative age-related macular degeneration of both eyes  H35.3131   3. Retinal drusen of both eyes  H35.363   4. Retinal edema  H35.81 OCT, Retina - OU - Both Eyes  5. Diabetes mellitus type 2 without retinopathy ([place])  E11.9   6. Essential hypertension  I10   7. Hypertensive retinopathy of both eyes  H35.033   8. Pseudophakia of both eyes  Z96.1   9. Combined forms of age-related cataract of both eyes  H25.813     1 Chorioretinal Scarring OS -- stable  - geographic patch of pigmented CR atrophy just outside IT arcades  - scattered patches of outer retinal atrophy  - per records, likely chronic and present on earliest exams in 2014 w/ Dr. Herbert Deaner  - no intervention indicated  - monitor  - f/u 3-4 months  2-4. Age related macular degeneration, non-exudative, both eyes  - early dry stage  - BCVA improved post cataract surgery to 20/20 OU  - OCT shows persistent focal SRF OD (superior to fovea), scattered small PEDs OU -- ?CSCR component  - FA 10.19.21 shows CNV superior macula corresponding to focal SRF OD  - discussed findings  - no intervention recommended as CNV remains minimally active, BCVA remains 20/20 and pt asymptomatic  - continue amsler grid monitoring  - f/u 3-4 months, sooner prn DFE, OCT  5. Diabetes mellitus, type 2 without retinopathy  - The incidence, risk factors for progression, natural history and treatment options for diabetic retinopathy  were discussed with patient.    - The need for close monitoring of blood glucose, blood pressure, and serum lipids, avoiding cigarette or any type of tobacco, and the need for long term follow up was also discussed with patient.  - f/u in 1 year, sooner prn  6,7. Hypertensive retinopathy OU  - discussed importance of tight BP control  - monitor  8. Pseudophakia OU  - s/p CEIOL OD on 12.14.20 and OS on 12.28.20 with Dr. Kathlen Mody.  - beautiful surgeries w/ uncorrected VA 20/20 OU  - pt extremely happy   Ophthalmic Meds  Ordered this visit:  No orders of the defined types were placed in this encounter.      Return for f/u 3-4 months, non exu ARMD OU, DFE, OCT.  There are no Patient Instructions on file for this visit.   Explained the diagnoses, plan, and follow up with the patient and they expressed understanding.  Patient expressed understanding of the importance of proper follow up care.   This document serves as a record of services personally performed by Gardiner Sleeper, MD, PhD. It was created on their behalf by Roselee Nova, COMT. The creation of this record is the provider's dictation and/or activities during the visit.  Electronically signed by: Roselee Nova, COMT 08/15/20 10:32 PM  Gardiner Sleeper, M.D., Ph.D. Diseases & Surgery of the Retina and Vitreous [facility]  I have reviewed the above documentation for accuracy and completeness, and I agree with the above. Gardiner Sleeper, M.D., Ph.D. 08/15/20 10:32 PM   Abbreviations: M myopia (nearsighted); A astigmatism; H hyperopia (farsighted); P presbyopia; Mrx spectacle prescription;  CTL contact lenses; OD right eye; OS left eye; OU both eyes  XT exotropia; ET esotropia; PEK punctate epithelial keratitis; PEE punctate epithelial erosions; DES dry eye syndrome; MGD meibomian gland dysfunction; ATs artificial tears; PFAT's preservative free artificial tears; [place] nuclear sclerotic cataract; PSC posterior  subcapsular cataract; ERM epi-retinal membrane; PVD posterior vitreous detachment; RD retinal detachment; DM diabetes mellitus; DR diabetic retinopathy; NPDR non-proliferative diabetic retinopathy; PDR proliferative diabetic retinopathy; CSME clinically significant macular edema; DME diabetic macular edema; dbh dot blot hemorrhages; CWS cotton wool spot; POAG primary open angle glaucoma; C/D cup-to-disc ratio; HVF humphrey visual field; GVF goldmann visual field; OCT optical coherence tomography; IOP intraocular pressure; BRVO  Branch retinal vein occlusion; CRVO central retinal vein occlusion; CRAO central retinal artery occlusion; BRAO branch retinal artery occlusion; RT retinal tear; SB scleral buckle; PPV pars plana vitrectomy; VH Vitreous hemorrhage; PRP panretinal laser photocoagulation; IVK intravitreal kenalog; VMT vitreomacular traction; MH Macular hole;  NVD neovascularization of the disc; NVE neovascularization elsewhere; AREDS age related eye disease study; ARMD age related macular degeneration; POAG primary open angle glaucoma; EBMD epithelial/anterior basement membrane dystrophy; ACIOL anterior chamber intraocular lens; IOL intraocular lens; PCIOL posterior chamber intraocular lens; Phaco/IOL phacoemulsification with intraocular lens placement; [place] photorefractive keratectomy; LASIK laser assisted in situ keratomileusis; HTN hypertension; DM diabetes mellitus; COPD chronic obstructive pulmonary disease

## 2020-08-14 ENCOUNTER — Encounter (INDEPENDENT_AMBULATORY_CARE_PROVIDER_SITE_OTHER): Payer: Self-pay | Admitting: Ophthalmology

## 2020-08-14 ENCOUNTER — Other Ambulatory Visit: Payer: Self-pay

## 2020-08-14 ENCOUNTER — Ambulatory Visit (INDEPENDENT_AMBULATORY_CARE_PROVIDER_SITE_OTHER): Payer: Medicare Other | Admitting: Ophthalmology

## 2020-08-14 DIAGNOSIS — H31002 Unspecified chorioretinal scars, left eye: Secondary | ICD-10-CM | POA: Diagnosis not present

## 2020-08-14 DIAGNOSIS — H35033 Hypertensive retinopathy, bilateral: Secondary | ICD-10-CM

## 2020-08-14 DIAGNOSIS — H353131 Nonexudative age-related macular degeneration, bilateral, early dry stage: Secondary | ICD-10-CM

## 2020-08-14 DIAGNOSIS — E119 Type 2 diabetes mellitus without complications: Secondary | ICD-10-CM

## 2020-08-14 DIAGNOSIS — Z961 Presence of intraocular lens: Secondary | ICD-10-CM | POA: Diagnosis not present

## 2020-08-14 DIAGNOSIS — H3581 Retinal edema: Secondary | ICD-10-CM

## 2020-08-14 DIAGNOSIS — I10 Essential (primary) hypertension: Secondary | ICD-10-CM

## 2020-08-14 DIAGNOSIS — H35363 Drusen (degenerative) of macula, bilateral: Secondary | ICD-10-CM | POA: Diagnosis not present

## 2020-08-14 DIAGNOSIS — H25813 Combined forms of age-related cataract, bilateral: Secondary | ICD-10-CM

## 2020-08-15 ENCOUNTER — Encounter (INDEPENDENT_AMBULATORY_CARE_PROVIDER_SITE_OTHER): Payer: Self-pay | Admitting: Ophthalmology

## 2020-09-11 DIAGNOSIS — G629 Polyneuropathy, unspecified: Secondary | ICD-10-CM | POA: Diagnosis not present

## 2020-09-11 DIAGNOSIS — E782 Mixed hyperlipidemia: Secondary | ICD-10-CM | POA: Diagnosis not present

## 2020-09-11 DIAGNOSIS — I10 Essential (primary) hypertension: Secondary | ICD-10-CM | POA: Diagnosis not present

## 2020-09-11 DIAGNOSIS — E10319 Type 1 diabetes mellitus with unspecified diabetic retinopathy without macular edema: Secondary | ICD-10-CM | POA: Diagnosis not present

## 2020-09-11 DIAGNOSIS — E1142 Type 2 diabetes mellitus with diabetic polyneuropathy: Secondary | ICD-10-CM | POA: Diagnosis not present

## 2020-09-18 DIAGNOSIS — Z0000 Encounter for general adult medical examination without abnormal findings: Secondary | ICD-10-CM | POA: Diagnosis not present

## 2020-09-18 DIAGNOSIS — Z888 Allergy status to other drugs, medicaments and biological substances status: Secondary | ICD-10-CM | POA: Diagnosis not present

## 2020-09-18 DIAGNOSIS — E1165 Type 2 diabetes mellitus with hyperglycemia: Secondary | ICD-10-CM | POA: Diagnosis not present

## 2020-09-18 DIAGNOSIS — E782 Mixed hyperlipidemia: Secondary | ICD-10-CM | POA: Diagnosis not present

## 2020-09-18 DIAGNOSIS — I10 Essential (primary) hypertension: Secondary | ICD-10-CM | POA: Diagnosis not present

## 2020-09-18 DIAGNOSIS — E10319 Type 1 diabetes mellitus with unspecified diabetic retinopathy without macular edema: Secondary | ICD-10-CM | POA: Diagnosis not present

## 2020-09-18 DIAGNOSIS — K760 Fatty (change of) liver, not elsewhere classified: Secondary | ICD-10-CM | POA: Diagnosis not present

## 2020-10-31 DIAGNOSIS — Z9641 Presence of insulin pump (external) (internal): Secondary | ICD-10-CM | POA: Diagnosis not present

## 2020-10-31 DIAGNOSIS — R609 Edema, unspecified: Secondary | ICD-10-CM | POA: Diagnosis not present

## 2020-10-31 DIAGNOSIS — I10 Essential (primary) hypertension: Secondary | ICD-10-CM | POA: Diagnosis not present

## 2020-10-31 DIAGNOSIS — E1165 Type 2 diabetes mellitus with hyperglycemia: Secondary | ICD-10-CM | POA: Diagnosis not present

## 2020-11-15 DIAGNOSIS — Z6827 Body mass index (BMI) 27.0-27.9, adult: Secondary | ICD-10-CM | POA: Diagnosis not present

## 2020-11-15 DIAGNOSIS — M4313 Spondylolisthesis, cervicothoracic region: Secondary | ICD-10-CM | POA: Diagnosis not present

## 2020-11-15 DIAGNOSIS — M4712 Other spondylosis with myelopathy, cervical region: Secondary | ICD-10-CM | POA: Diagnosis not present

## 2020-11-26 ENCOUNTER — Emergency Department (HOSPITAL_COMMUNITY): Payer: Medicare Other

## 2020-11-26 ENCOUNTER — Other Ambulatory Visit: Payer: Self-pay

## 2020-11-26 ENCOUNTER — Encounter (HOSPITAL_COMMUNITY): Payer: Self-pay

## 2020-11-26 ENCOUNTER — Inpatient Hospital Stay (HOSPITAL_COMMUNITY)
Admission: EM | Admit: 2020-11-26 | Discharge: 2020-11-30 | DRG: 854 | Disposition: A | Discharge status: Home / Self Care | Payer: Medicare Other | Attending: Internal Medicine | Admitting: Internal Medicine

## 2020-11-26 DIAGNOSIS — E785 Hyperlipidemia, unspecified: Secondary | ICD-10-CM | POA: Diagnosis present

## 2020-11-26 DIAGNOSIS — Z20822 Contact with and (suspected) exposure to covid-19: Secondary | ICD-10-CM | POA: Diagnosis present

## 2020-11-26 DIAGNOSIS — I2510 Atherosclerotic heart disease of native coronary artery without angina pectoris: Secondary | ICD-10-CM | POA: Diagnosis present

## 2020-11-26 DIAGNOSIS — A419 Sepsis, unspecified organism: Secondary | ICD-10-CM | POA: Diagnosis present

## 2020-11-26 DIAGNOSIS — E871 Hypo-osmolality and hyponatremia: Secondary | ICD-10-CM | POA: Diagnosis present

## 2020-11-26 DIAGNOSIS — R0689 Other abnormalities of breathing: Secondary | ICD-10-CM | POA: Diagnosis not present

## 2020-11-26 DIAGNOSIS — K612 Anorectal abscess: Secondary | ICD-10-CM | POA: Diagnosis present

## 2020-11-26 DIAGNOSIS — M5441 Lumbago with sciatica, right side: Secondary | ICD-10-CM | POA: Diagnosis not present

## 2020-11-26 DIAGNOSIS — E441 Mild protein-calorie malnutrition: Secondary | ICD-10-CM | POA: Diagnosis present

## 2020-11-26 DIAGNOSIS — Z833 Family history of diabetes mellitus: Secondary | ICD-10-CM

## 2020-11-26 DIAGNOSIS — Z91013 Allergy to seafood: Secondary | ICD-10-CM

## 2020-11-26 DIAGNOSIS — K219 Gastro-esophageal reflux disease without esophagitis: Secondary | ICD-10-CM | POA: Diagnosis present

## 2020-11-26 DIAGNOSIS — K610 Anal abscess: Secondary | ICD-10-CM | POA: Diagnosis not present

## 2020-11-26 DIAGNOSIS — Z8042 Family history of malignant neoplasm of prostate: Secondary | ICD-10-CM

## 2020-11-26 DIAGNOSIS — R6520 Severe sepsis without septic shock: Secondary | ICD-10-CM

## 2020-11-26 DIAGNOSIS — B9620 Unspecified Escherichia coli [E. coli] as the cause of diseases classified elsewhere: Secondary | ICD-10-CM | POA: Diagnosis present

## 2020-11-26 DIAGNOSIS — K611 Rectal abscess: Secondary | ICD-10-CM | POA: Diagnosis not present

## 2020-11-26 DIAGNOSIS — F32A Depression, unspecified: Secondary | ICD-10-CM | POA: Diagnosis present

## 2020-11-26 DIAGNOSIS — K861 Other chronic pancreatitis: Secondary | ICD-10-CM | POA: Diagnosis present

## 2020-11-26 DIAGNOSIS — F418 Other specified anxiety disorders: Secondary | ICD-10-CM | POA: Diagnosis not present

## 2020-11-26 DIAGNOSIS — K760 Fatty (change of) liver, not elsewhere classified: Secondary | ICD-10-CM | POA: Diagnosis present

## 2020-11-26 DIAGNOSIS — Z823 Family history of stroke: Secondary | ICD-10-CM

## 2020-11-26 DIAGNOSIS — Z6827 Body mass index (BMI) 27.0-27.9, adult: Secondary | ICD-10-CM

## 2020-11-26 DIAGNOSIS — F419 Anxiety disorder, unspecified: Secondary | ICD-10-CM | POA: Diagnosis present

## 2020-11-26 DIAGNOSIS — Z85038 Personal history of other malignant neoplasm of large intestine: Secondary | ICD-10-CM

## 2020-11-26 DIAGNOSIS — Z91048 Other nonmedicinal substance allergy status: Secondary | ICD-10-CM

## 2020-11-26 DIAGNOSIS — Z87891 Personal history of nicotine dependence: Secondary | ICD-10-CM

## 2020-11-26 DIAGNOSIS — Z888 Allergy status to other drugs, medicaments and biological substances status: Secondary | ICD-10-CM

## 2020-11-26 DIAGNOSIS — Z8639 Personal history of other endocrine, nutritional and metabolic disease: Secondary | ICD-10-CM | POA: Diagnosis not present

## 2020-11-26 DIAGNOSIS — E109 Type 1 diabetes mellitus without complications: Secondary | ICD-10-CM | POA: Diagnosis present

## 2020-11-26 DIAGNOSIS — Z83438 Family history of other disorder of lipoprotein metabolism and other lipidemia: Secondary | ICD-10-CM

## 2020-11-26 DIAGNOSIS — R509 Fever, unspecified: Secondary | ICD-10-CM | POA: Diagnosis not present

## 2020-11-26 DIAGNOSIS — I700 Atherosclerosis of aorta: Secondary | ICD-10-CM | POA: Diagnosis present

## 2020-11-26 DIAGNOSIS — I10 Essential (primary) hypertension: Secondary | ICD-10-CM | POA: Diagnosis present

## 2020-11-26 DIAGNOSIS — Z7982 Long term (current) use of aspirin: Secondary | ICD-10-CM

## 2020-11-26 DIAGNOSIS — R609 Edema, unspecified: Secondary | ICD-10-CM | POA: Diagnosis not present

## 2020-11-26 DIAGNOSIS — E876 Hypokalemia: Secondary | ICD-10-CM | POA: Diagnosis present

## 2020-11-26 DIAGNOSIS — Z8546 Personal history of malignant neoplasm of prostate: Secondary | ICD-10-CM | POA: Diagnosis not present

## 2020-11-26 DIAGNOSIS — Z79899 Other long term (current) drug therapy: Secondary | ICD-10-CM

## 2020-11-26 DIAGNOSIS — N179 Acute kidney failure, unspecified: Secondary | ICD-10-CM | POA: Diagnosis present

## 2020-11-26 DIAGNOSIS — Z794 Long term (current) use of insulin: Secondary | ICD-10-CM

## 2020-11-26 DIAGNOSIS — R0902 Hypoxemia: Secondary | ICD-10-CM | POA: Diagnosis not present

## 2020-11-26 DIAGNOSIS — K529 Noninfective gastroenteritis and colitis, unspecified: Secondary | ICD-10-CM | POA: Diagnosis present

## 2020-11-26 DIAGNOSIS — Z8249 Family history of ischemic heart disease and other diseases of the circulatory system: Secondary | ICD-10-CM

## 2020-11-26 DIAGNOSIS — E872 Acidosis: Secondary | ICD-10-CM | POA: Diagnosis not present

## 2020-11-26 LAB — CBC WITH DIFFERENTIAL/PLATELET
Abs Immature Granulocytes: 0.12 10*3/uL — ABNORMAL HIGH (ref 0.00–0.07)
Basophils Absolute: 0 10*3/uL (ref 0.0–0.1)
Basophils Relative: 0 %
Eosinophils Absolute: 0 10*3/uL (ref 0.0–0.5)
Eosinophils Relative: 0 %
HCT: 49 % (ref 39.0–52.0)
Hemoglobin: 16.4 g/dL (ref 13.0–17.0)
Immature Granulocytes: 1 %
Lymphocytes Relative: 7 %
Lymphs Abs: 1 10*3/uL (ref 0.7–4.0)
MCH: 30 pg (ref 26.0–34.0)
MCHC: 33.5 g/dL (ref 30.0–36.0)
MCV: 89.6 fL (ref 80.0–100.0)
Monocytes Absolute: 1.7 10*3/uL — ABNORMAL HIGH (ref 0.1–1.0)
Monocytes Relative: 11 %
Neutro Abs: 11.8 10*3/uL — ABNORMAL HIGH (ref 1.7–7.7)
Neutrophils Relative %: 81 %
Platelets: 200 10*3/uL (ref 150–400)
RBC: 5.47 MIL/uL (ref 4.22–5.81)
RDW: 13.5 % (ref 11.5–15.5)
WBC: 14.6 10*3/uL — ABNORMAL HIGH (ref 4.0–10.5)
nRBC: 0 % (ref 0.0–0.2)

## 2020-11-26 LAB — URINALYSIS, ROUTINE W REFLEX MICROSCOPIC
Bacteria, UA: NONE SEEN
Bilirubin Urine: NEGATIVE
Glucose, UA: NEGATIVE mg/dL
Ketones, ur: 20 mg/dL — AB
Leukocytes,Ua: NEGATIVE
Nitrite: NEGATIVE
Protein, ur: 30 mg/dL — AB
Specific Gravity, Urine: 1.019 (ref 1.005–1.030)
pH: 5 (ref 5.0–8.0)

## 2020-11-26 LAB — COMPREHENSIVE METABOLIC PANEL
ALT: 35 U/L (ref 0–44)
AST: 74 U/L — ABNORMAL HIGH (ref 15–41)
Albumin: 2.9 g/dL — ABNORMAL LOW (ref 3.5–5.0)
Alkaline Phosphatase: 84 U/L (ref 38–126)
Anion gap: 13 (ref 5–15)
BUN: 31 mg/dL — ABNORMAL HIGH (ref 8–23)
CO2: 25 mmol/L (ref 22–32)
Calcium: 8.6 mg/dL — ABNORMAL LOW (ref 8.9–10.3)
Chloride: 95 mmol/L — ABNORMAL LOW (ref 98–111)
Creatinine, Ser: 1.67 mg/dL — ABNORMAL HIGH (ref 0.61–1.24)
GFR, Estimated: 43 mL/min — ABNORMAL LOW (ref >60)
Glucose, Bld: 185 mg/dL — ABNORMAL HIGH (ref 70–99)
Potassium: 2.8 mmol/L — ABNORMAL LOW (ref 3.5–5.1)
Sodium: 133 mmol/L — ABNORMAL LOW (ref 135–145)
Total Bilirubin: 1.2 mg/dL (ref 0.3–1.2)
Total Protein: 7 g/dL (ref 6.5–8.1)

## 2020-11-26 LAB — PROTIME-INR
INR: 1.1 (ref 0.8–1.2)
Prothrombin Time: 14.3 seconds (ref 11.4–15.2)

## 2020-11-26 LAB — CBG MONITORING, ED
Glucose-Capillary: 199 mg/dL — ABNORMAL HIGH (ref 70–99)
Glucose-Capillary: 229 mg/dL — ABNORMAL HIGH (ref 70–99)

## 2020-11-26 LAB — RESP PANEL BY RT-PCR (FLU A&B, COVID) ARPGX2
Influenza A by PCR: NEGATIVE
Influenza B by PCR: NEGATIVE
SARS Coronavirus 2 by RT PCR: NEGATIVE

## 2020-11-26 LAB — MAGNESIUM: Magnesium: 2.1 mg/dL (ref 1.7–2.4)

## 2020-11-26 LAB — LACTIC ACID, PLASMA
Lactic Acid, Venous: 2 mmol/L (ref 0.5–1.9)
Lactic Acid, Venous: 1.5 mmol/L (ref 0.5–1.9)

## 2020-11-26 LAB — APTT: aPTT: 25 seconds (ref 24–36)

## 2020-11-26 IMAGING — DX DG CHEST 1V PORT
2 series · 2 of 2 positions shown · non-contrast
Comparison: [DATE]

CLINICAL DATA: Fever for 4 days, sepsis

EXAM:
PORTABLE CHEST 1 VIEW

[chest ap (1 of 2)]
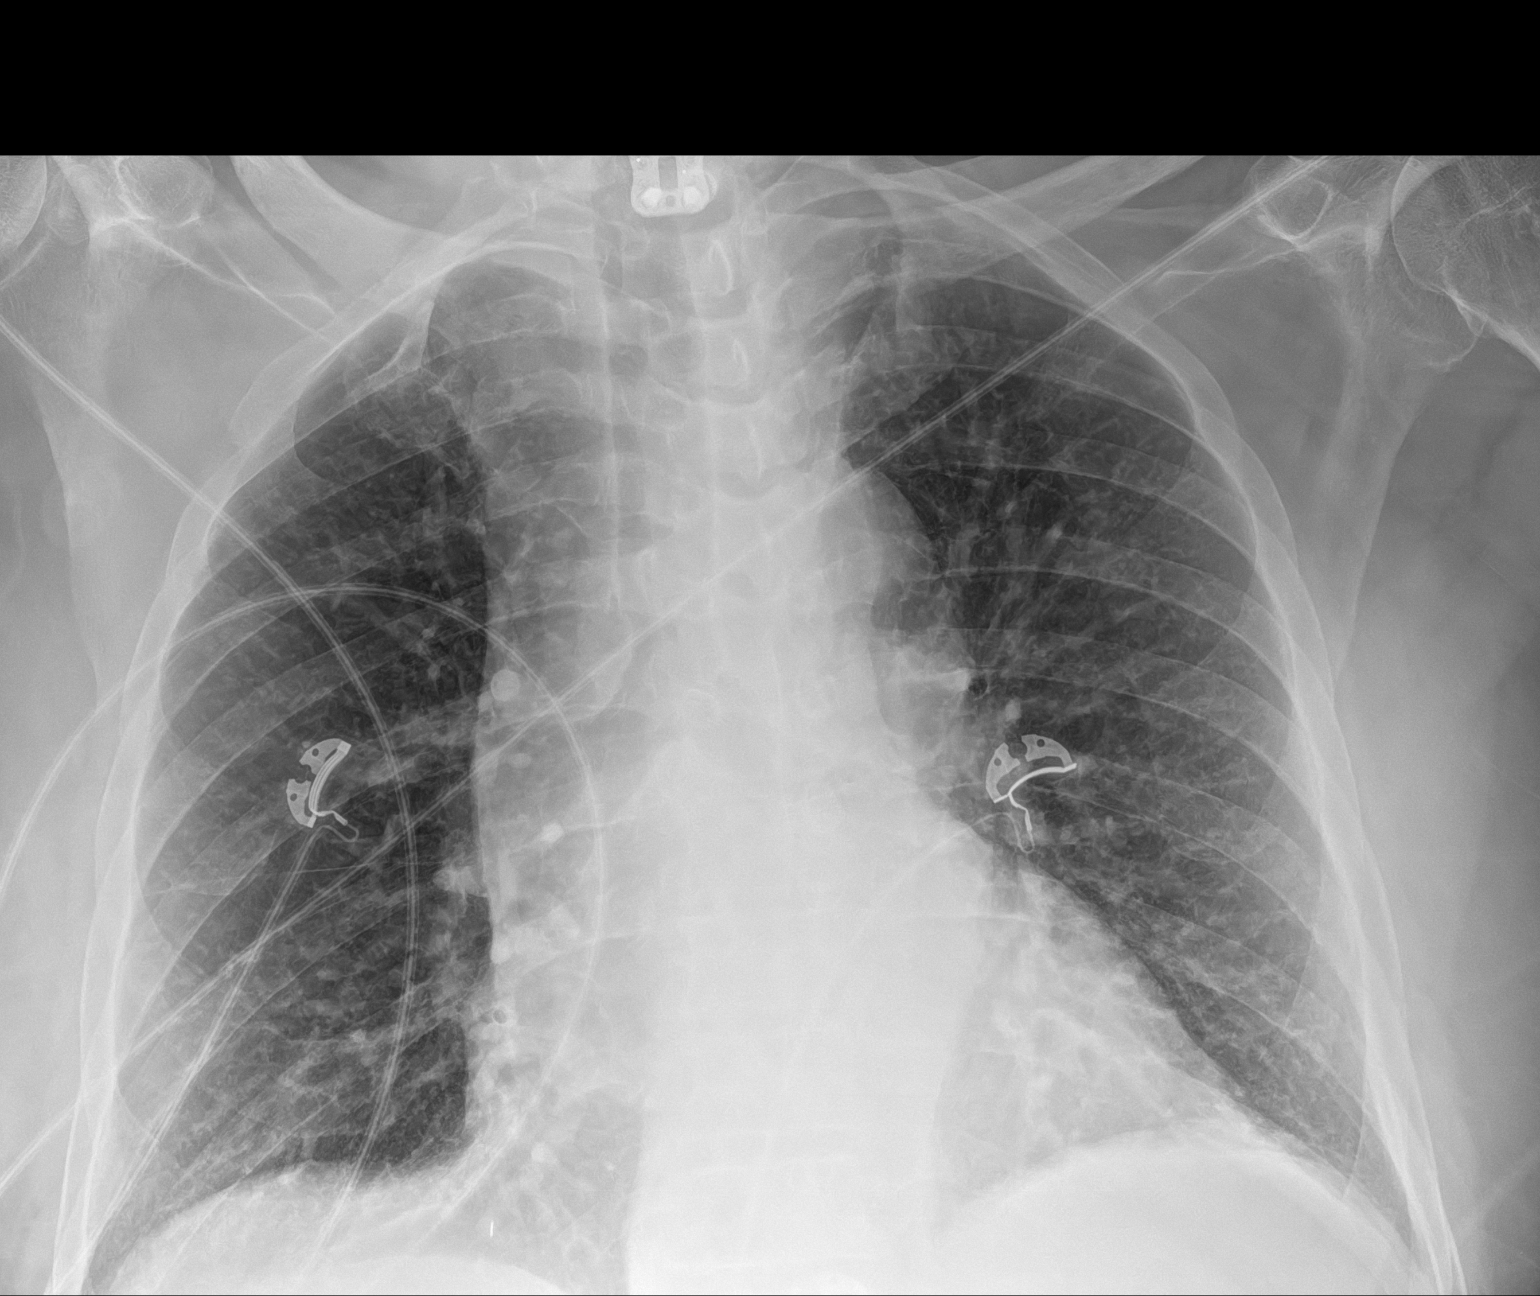

[chest ap (2 of 2)]
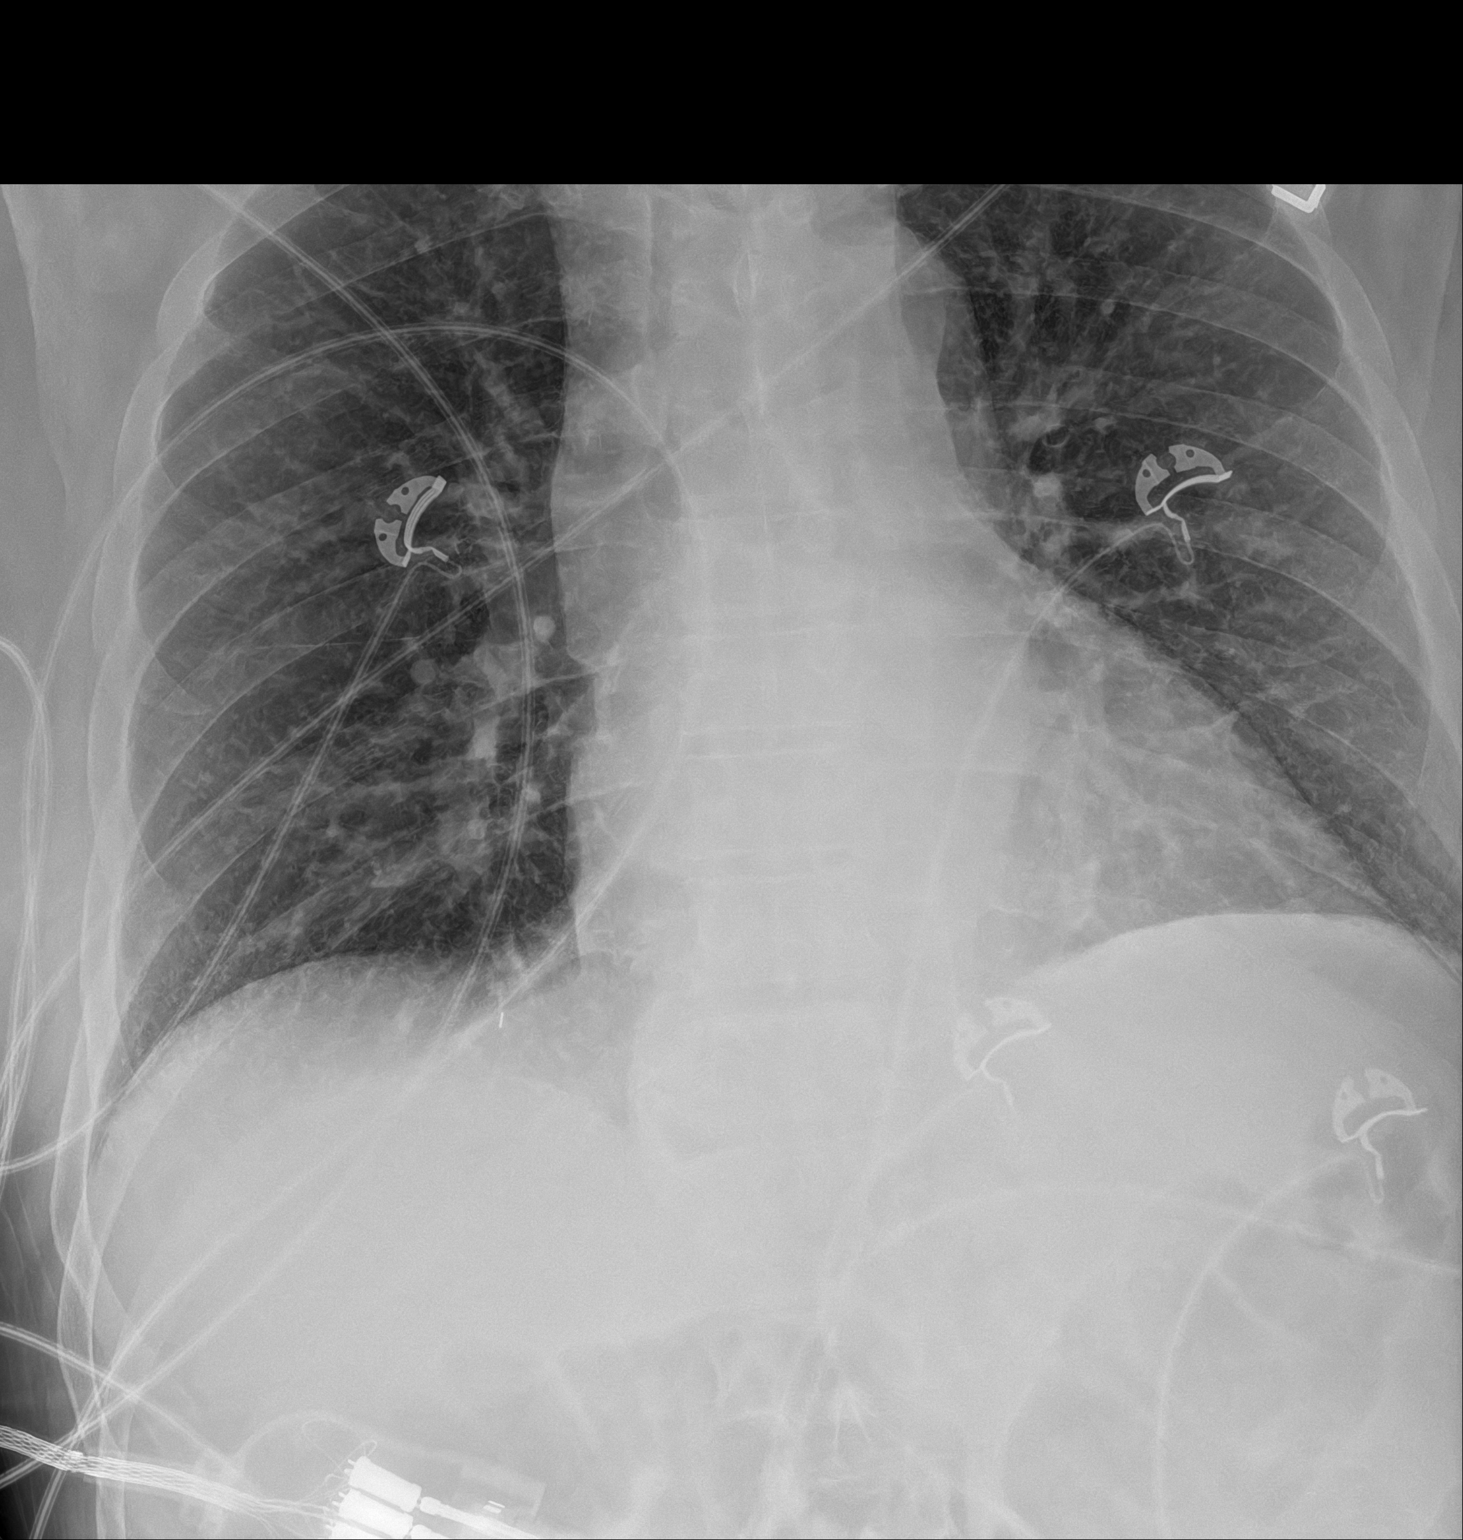

[2 of 2 positions shown; findings below may reference images not displayed]

FINDINGS: 2 frontal views of the chest demonstrate an unremarkable cardiac
silhouette. No acute airspace disease, effusion, or pneumothorax. No
acute bony abnormalities.
IMPRESSION: 1. No acute intrathoracic process.

## 2020-11-26 IMAGING — CT CT ABD-PELV W/ CM
2 of 5 series · 14 of 46 positions shown, 16 images · IV contrast (Omnipaque or Isovue)
Comparison: MRI pelvis [DATE] and CT abdomen/pelvis [DATE]

CLINICAL DATA: Abdominal abscess along the peroneal/rectal region.
Chronic pancreatitis. History of prostate and colon cancer

EXAM:
CT ABDOMEN AND PELVIS WITH CONTRAST
TECHNIQUE: Multidetector CT imaging of the abdomen and pelvis was performed
using the standard protocol following bolus administration of
intravenous contrast.
CONTRAST:  80mL OMNIPAQUE IOHEXOL 300 MG/ML  SOLN

[Series 2: axial st · axial · 0.86mm/px · z∈[+452,+988]mm · 11 of 119 slices shown, 13 images]
[im 6/119  soft-tissue]
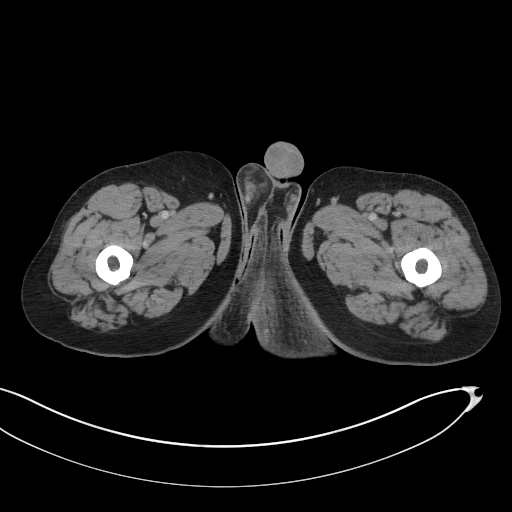
[im 6/119  bone]
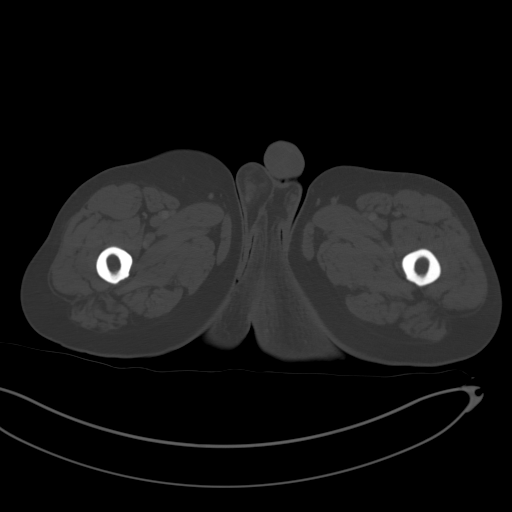
[im 17/119  soft-tissue]
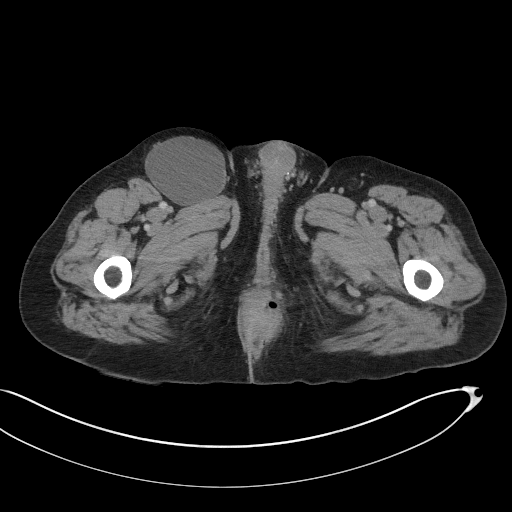
[im 29/119  soft-tissue]
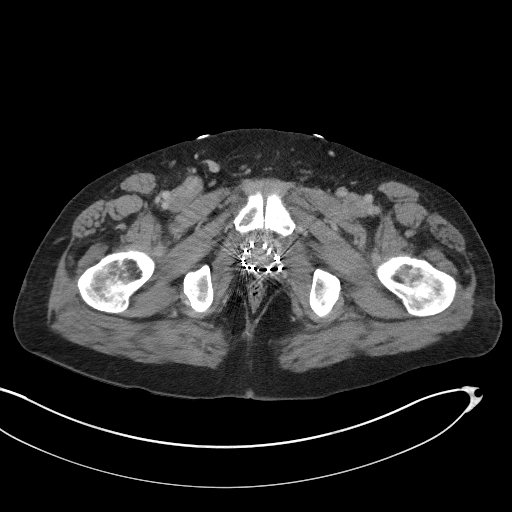
[im 40/119  soft-tissue]
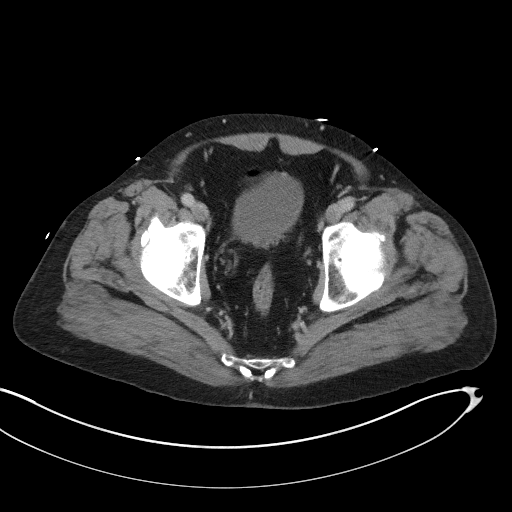
[im 51/119  soft-tissue]
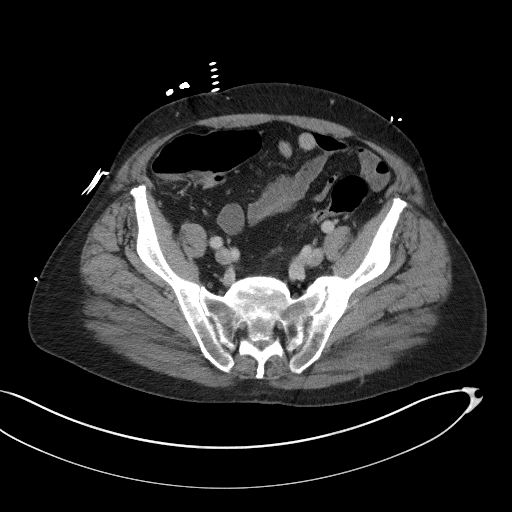
[im 62/119  soft-tissue]
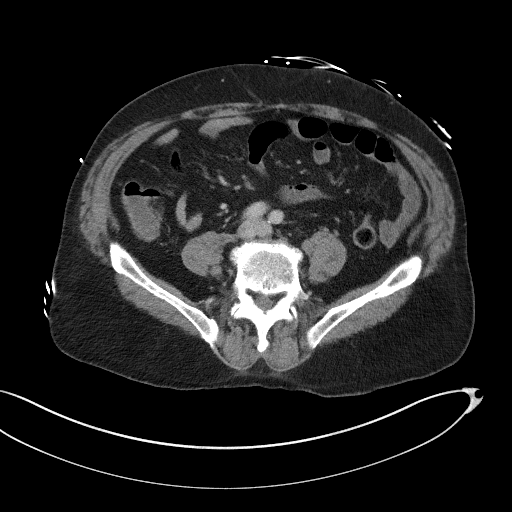
[im 68/119  soft-tissue]
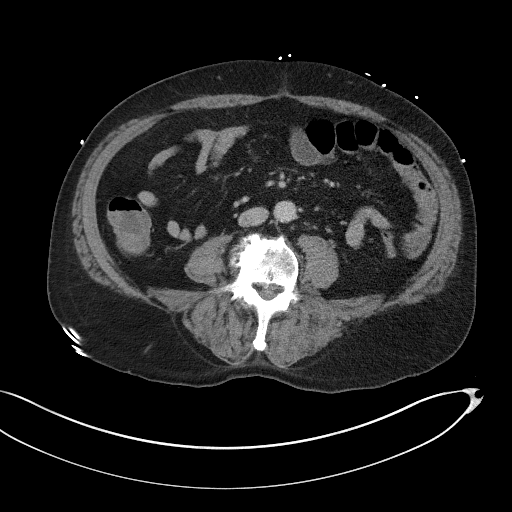
[im 79/119  soft-tissue]
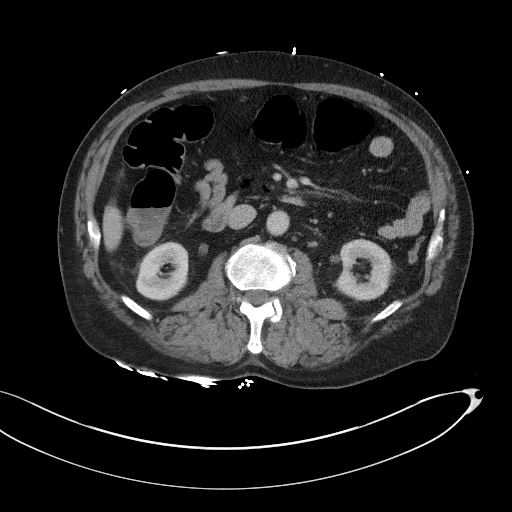
[im 90/119  soft-tissue]
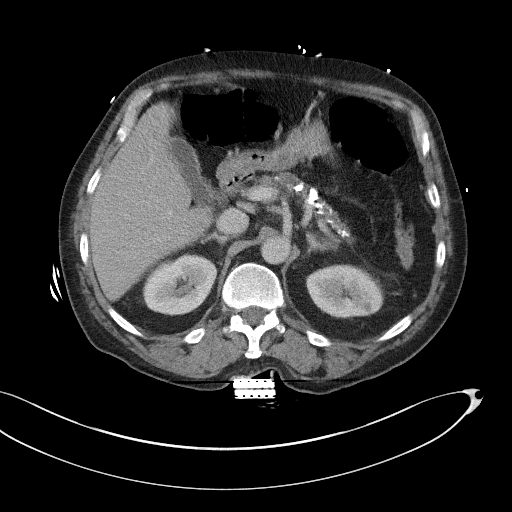
[im 90/119  bone]
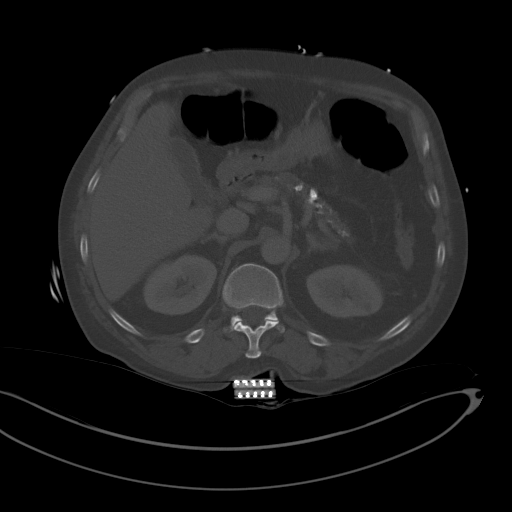
[im 102/119  soft-tissue]
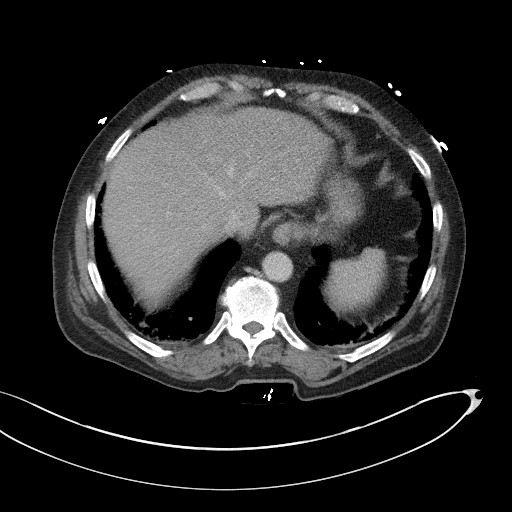
[im 113/119  soft-tissue]
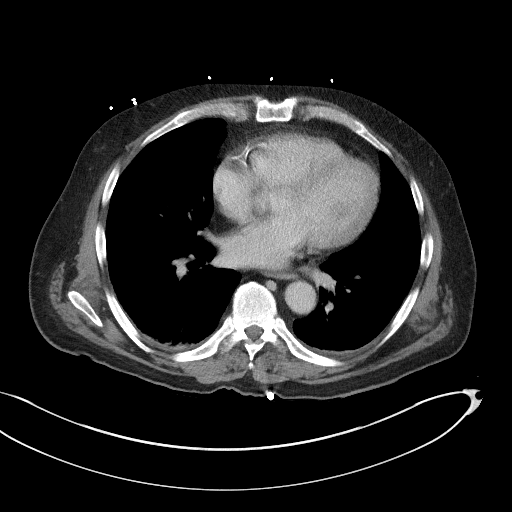

[Series 5: coronal st · coronal · 0.83mm/px · 3 of 125 slices shown]
[im 42/125  soft-tissue]
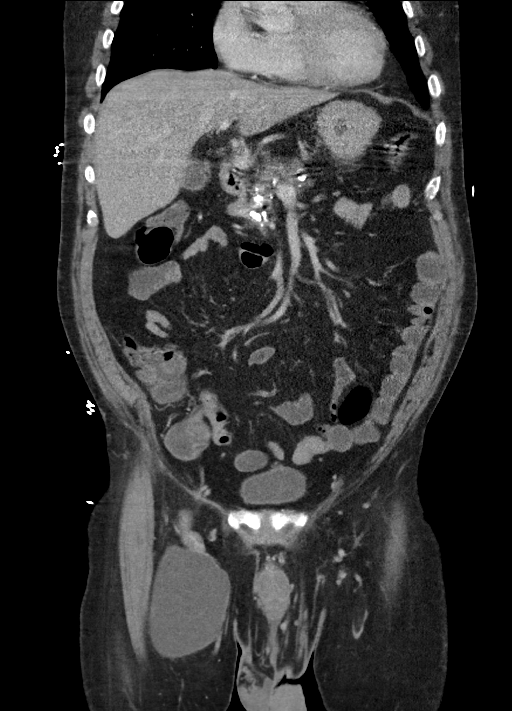
[im 56/125  soft-tissue]
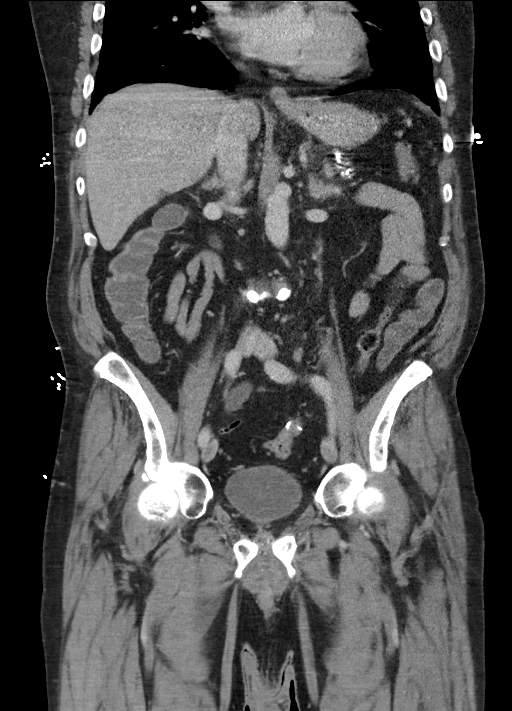
[im 69/125  soft-tissue]
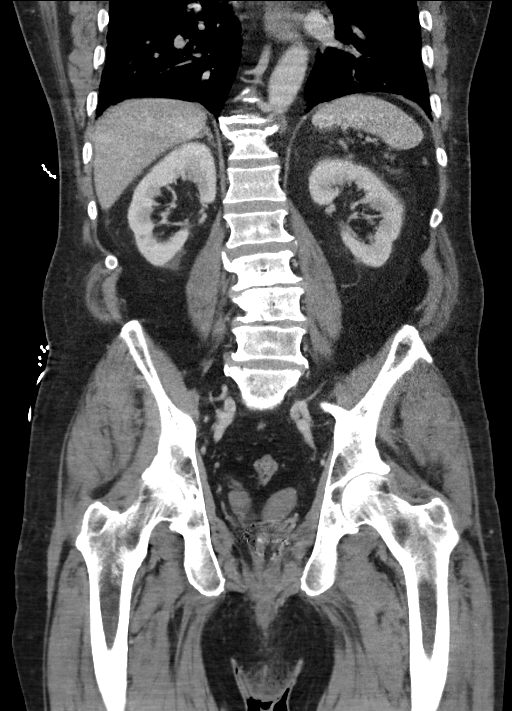

[14 of 46 positions shown; findings below may reference images not displayed]

FINDINGS: Lower chest: Gynecomastia. Mild cardiomegaly. Right coronary artery
atherosclerotic vascular calcification. Dependent subsegmental
atelectasis in the posterior basal segments of both lower lobes.

Hepatobiliary: Probable hepatic steatosis. Mildly contracted
gallbladder. Streak artifact from electronic device posterior to the
patient partially obscures the upper abdomen.

Pancreas: Coarse calcifications in the pancreatic parenchyma
compatible with chronic calcific pancreatitis.

Spleen: Unremarkable

Adrenals/Urinary Tract: Mild bilateral renal sinus lipomatosis.
Urinary bladder unremarkable. No urinary tract calculi are
identified. The adrenal glands appear normal. 0.6 cm left kidney
upper pole hypodense lesion is likely a cyst although technically
too small to characterize, unchanged from [0S].

Stomach/Bowel: Anastomotic staple line in the distal sigmoid colon.
Normal appendix.

Left eccentric intersphincteric perianal abscess along the left
lower buttock likely extending into the subcutaneous tissues,
measuring about 4.1 by 2.7 by 4.4 cm (volume = 26 cm^3), but also
with a linear 3.2 cm extension cephalad along the posterior margin
of the external sphincter. There is adjacent subcutaneous edema
along the perineum.

Vascular/Lymphatic: Aortoiliac atherosclerotic vascular disease. No
pathologic adenopathy identified.

Reproductive: Brachytherapy seed implants in the small prostate
gland.

Other: Chronic 7.1 by 5.9 by 9.2 cm (volume = 200 cm^3) simple
cystic lesion along the right groin, substantially increased in size
from [DATE] where this measured 3.0 by 1.9 by 3.9 cm (volume =
12 cm^3). Possibilities include seroma, lymphangioma, or ganglion
cyst.

Musculoskeletal: Bridging spurring of the left sacroiliac joint.
Subtle sclerosis in the left iliac bone for example on image 62
series 2, increased from prior. Degenerative endplate sclerosis at
the L3-4 level.
IMPRESSION: 1. 26 cubic cm left perianal abscess with intersphincteric and
subcutaneous components. There is also edema tracking along the
perineum, and very careful clinical surveillance to exclude the
possibility of a progressive fasciitis/YNAKI gangrene is
recommended.
2. Substantial enlargement in the simple appearing cystic lesion
along the right groin region, currently 200 cubic cm and previously
12 cubic cm. This could represent a seroma, lymphangioma, or
ganglion cyst.
3. Subtle sclerosis in the left iliac bone compared to the right.
Although the findings are subtle, strictly speaking I cannot exclude
prostate metastatic lesion, and correlation with PSA level and/or
bone scan would be suggested.
4. Other imaging findings of potential clinical significance: Mild
cardiomegaly. Coronary atherosclerosis. Probable hepatic steatosis.
Chronic calcific pancreatitis. Renal sinus lipomatosis. Aortic
Atherosclerosis ([0S]-[0S]). Seed implants in the prostate gland.

## 2020-11-26 MED ORDER — POTASSIUM CHLORIDE 10 MEQ/100ML IV SOLN
10.0000 meq | Freq: Once | INTRAVENOUS | Status: AC
  Administered 2020-11-26: 10 meq via INTRAVENOUS
  Filled 2020-11-26: qty 100

## 2020-11-26 MED ORDER — ACETAMINOPHEN 500 MG PO TABS
1000.0000 mg | ORAL_TABLET | Freq: Once | ORAL | Status: AC
  Administered 2020-11-26: 1000 mg via ORAL
  Filled 2020-11-26: qty 2

## 2020-11-26 MED ORDER — SODIUM CHLORIDE 0.9 % IV SOLN: INTRAVENOUS | Status: DC

## 2020-11-26 MED ORDER — LACTATED RINGERS IV BOLUS
1000.0000 mL | Freq: Once | INTRAVENOUS | Status: AC
  Administered 2020-11-26: 1000 mL via INTRAVENOUS

## 2020-11-26 MED ORDER — PIPERACILLIN-TAZOBACTAM 3.375 G IVPB 30 MIN: 3.3750 g | Freq: Three times a day (TID) | INTRAVENOUS | Status: DC

## 2020-11-26 MED ORDER — POTASSIUM CHLORIDE 20 MEQ PO PACK
40.0000 meq | PACK | Freq: Once | ORAL | Status: AC
  Administered 2020-11-26: 40 meq via ORAL
  Filled 2020-11-26: qty 2

## 2020-11-26 MED ORDER — SODIUM CHLORIDE 0.9 % IV BOLUS
500.0000 mL | Freq: Once | INTRAVENOUS | Status: AC
  Administered 2020-11-26: 500 mL via INTRAVENOUS

## 2020-11-26 MED ORDER — POTASSIUM CHLORIDE 10 MEQ/100ML IV SOLN
10.0000 meq | INTRAVENOUS | Status: AC
  Administered 2020-11-26 – 2020-11-27 (×3): 10 meq via INTRAVENOUS
  Filled 2020-11-26 (×3): qty 100

## 2020-11-26 MED ORDER — LACTATED RINGERS IV BOLUS (SEPSIS)
1000.0000 mL | Freq: Once | INTRAVENOUS | Status: AC
  Administered 2020-11-26: 1000 mL via INTRAVENOUS

## 2020-11-26 MED ORDER — IOHEXOL 300 MG/ML  SOLN
80.0000 mL | Freq: Once | INTRAMUSCULAR | Status: AC | PRN
  Administered 2020-11-26: 80 mL via INTRAVENOUS

## 2020-11-26 MED ORDER — PIPERACILLIN-TAZOBACTAM 3.375 G IVPB 30 MIN
3.3750 g | Freq: Once | INTRAVENOUS | Status: AC
  Administered 2020-11-26: 3.375 g via INTRAVENOUS
  Filled 2020-11-26: qty 50

## 2020-11-26 MED ORDER — PIPERACILLIN-TAZOBACTAM 3.375 G IVPB
3.3750 g | Freq: Three times a day (TID) | INTRAVENOUS | Status: DC
  Administered 2020-11-27 – 2020-11-30 (×11): 3.375 g via INTRAVENOUS
  Filled 2020-11-26 (×12): qty 50

## 2020-11-26 NOTE — ED Notes (Signed)
Pt is alert but confused. Pt is easily redirected to situation. Pt vitals assessed and BP trending low. EDP made aware and orders to be given. Will continue to monitor pt. No needs expressed by pt or family at this time.

## 2020-11-26 NOTE — ED Notes (Signed)
Pt dexcom and omipod had to be removed for CT scan. Hardware given to pt wife.

## 2020-11-26 NOTE — ED Notes (Signed)
Provider at bedside to assess patient.

## 2020-11-26 NOTE — ED Notes (Signed)
Pt is asleep and resting comfortably in the bed

## 2020-11-26 NOTE — ED Provider Notes (Signed)
Care assumed from [facility], [place], at shift change, please see their notes for full documentation of patient's complaint/HPI. Briefly, pt here with increased confusion for the past 3-4 days, not eating/drinking as much as well as concern for possible abscess to perineal area that wife noticed 3 days ago. Results so far show leukocytosis 14,600 and lactic acid 2.0. Awaiting CT A/P. Plan is to admit for sepsis.   Physical Exam  BP 116/69   Pulse 96   Temp (!) 102.7 F (39.3 C) (Rectal)   Resp (!) 26   Ht 6' (1.829 m)   Wt 90.7 kg   SpO2 93%   BMI 27.12 kg/m   Physical Exam Vitals and nursing note reviewed.  Constitutional:      Appearance: He is not ill-appearing.  HENT:     Head: Normocephalic and atraumatic.  Eyes:     Conjunctiva/sclera: Conjunctivae normal.  Cardiovascular:     Rate and Rhythm: Normal rate and regular rhythm.  Pulmonary:     Effort: Pulmonary effort is normal.     Breath sounds: Normal breath sounds.  Skin:    General: Skin is warm and dry.     Coloration: Skin is not jaundiced.  Neurological:     Mental Status: He is alert.     ED Course/Procedures   Clinical Course as of 11/26/20 2222  Tue Nov 26, 2020  1932 Lactic Acid, Venous(!!): 2.0 [MV]  2126 Npo after midnight,  [MV]    Clinical Course User Index [MV] Eustaquio Maize, PA-C    Procedures  CT ABDOMEN PELVIS W CONTRAST  Result Date: 11/26/2020 CLINICAL DATA:  Abdominal abscess along the peroneal/rectal region. Chronic pancreatitis. History of prostate and colon cancer EXAM: CT ABDOMEN AND PELVIS WITH CONTRAST TECHNIQUE: Multidetector CT imaging of the abdomen and pelvis was performed using the standard protocol following bolus administration of intravenous contrast. CONTRAST:  81mL OMNIPAQUE IOHEXOL 300 MG/ML  SOLN COMPARISON:  MRI pelvis 02/26/2020 and CT abdomen/pelvis 03/14/2015 FINDINGS: Lower chest: Gynecomastia. Mild cardiomegaly. Right coronary artery atherosclerotic vascular  calcification. Dependent subsegmental atelectasis in the posterior basal segments of both lower lobes. Hepatobiliary: Probable hepatic steatosis. Mildly contracted gallbladder. Streak artifact from electronic device posterior to the patient partially obscures the upper abdomen. Pancreas: Coarse calcifications in the pancreatic parenchyma compatible with chronic calcific pancreatitis. Spleen: Unremarkable Adrenals/Urinary Tract: Mild bilateral renal sinus lipomatosis. Urinary bladder unremarkable. No urinary tract calculi are identified. The adrenal glands appear normal. 0.6 cm left kidney upper pole hypodense lesion is likely a cyst although technically too small to characterize, unchanged from 2016. Stomach/Bowel: Anastomotic staple line in the distal sigmoid colon. Normal appendix. Left eccentric intersphincteric perianal abscess along the left lower buttock likely extending into the subcutaneous tissues, measuring about 4.1 by 2.7 by 4.4 cm (volume = 26 cm^3), but also with a linear 3.2 cm extension cephalad along the posterior margin of the external sphincter. There is adjacent subcutaneous edema along the perineum. Vascular/Lymphatic: Aortoiliac atherosclerotic vascular disease. No pathologic adenopathy identified. Reproductive: Brachytherapy seed implants in the small prostate gland. Other: Chronic 7.1 by 5.9 by 9.2 cm (volume = 200 cm^3) simple cystic lesion along the right groin, substantially increased in size from 03/14/2015 where this measured 3.0 by 1.9 by 3.9 cm (volume = 12 cm^3). Possibilities include seroma, lymphangioma, or ganglion cyst. Musculoskeletal: Bridging spurring of the left sacroiliac joint. Subtle sclerosis in the left iliac bone for example on image 62 series 2, increased from prior. Degenerative endplate sclerosis at the L3-4  level. IMPRESSION: 1. 26 cubic cm left perianal abscess with intersphincteric and subcutaneous components. There is also edema tracking along the perineum, and  very careful clinical surveillance to exclude the possibility of a progressive fasciitis/Fournier's gangrene is recommended. 2. Substantial enlargement in the simple appearing cystic lesion along the right groin region, currently 200 cubic cm and previously 12 cubic cm. This could represent a seroma, lymphangioma, or ganglion cyst. 3. Subtle sclerosis in the left iliac bone compared to the right. Although the findings are subtle, strictly speaking I cannot exclude prostate metastatic lesion, and correlation with PSA level and/or bone scan would be suggested. 4. Other imaging findings of potential clinical significance: Mild cardiomegaly. Coronary atherosclerosis. Probable hepatic steatosis. Chronic calcific pancreatitis. Renal sinus lipomatosis. Aortic Atherosclerosis (ICD10-I70.0). Seed implants in the prostate gland. Electronically Signed   By: Van Clines M.D.   On: 11/26/2020 20:33   DG Chest Port 1 View  Result Date: 11/26/2020 CLINICAL DATA:  Fever for 4 days, sepsis EXAM: PORTABLE CHEST 1 VIEW COMPARISON:  10/06/2017 FINDINGS: 2 frontal views of the chest demonstrate an unremarkable cardiac silhouette. No acute airspace disease, effusion, or pneumothorax. No acute bony abnormalities. IMPRESSION: 1. No acute intrathoracic process. Electronically Signed   By: Randa Ngo M.D.   On: 11/26/2020 18:45    MDM  Discussed case with general surgeon Dr. Constance Haw who recommends NPO after midnight with plans for surgical drainage tomorrow. She will come see patient in the AM. Agrees with zosyn at this time. Will consult medicine for admission.   Discussed case with [facility] Dr. Clearence Ped who agrees to accept patient for admission.   This note was prepared using Dragon voice recognition software and may include unintentional dictation errors due to the inherent limitations of voice recognition software.     Eustaquio Maize, PA-C 11/26/20 2223    Lajean Saver, MD 11/27/20 [phone]

## 2020-11-26 NOTE — ED Provider Notes (Signed)
[facility] EMERGENCY DEPARTMENT Provider Note   CSN: [id number] Arrival date & time: 11/26/20  1533     History No chief complaint on file.   Anthony [name] is a 75 y.o. male.  HPI      Anthony [name] is a 75 y.o. male with past medical history of hypertension, type 2 diabetes, chronic pancreatitis and prostate and colon cancer who presents to the Emergency Department complaining of pain and swelling of his perineum and left lower buttock.  Patient is poor historian, difficult to direct to verbalize his symptoms.  Patient's spouse is at bedside and able to provide history.  She patient has been confused x 3 to 4 days and she endorses redness and swelling near his left buttock. She states that he is very lucid at baseline, but today was unable to recall his birthday.  She also noticed decreased urination and decreased activity since the weekend.  States that she has been attempting to give him fluids and small amounts of fluids but states that his appetite has decreased.  Family concerned he may have a urinary tract infection.  Patient denies any vomiting, diarrhea, burning with urination, and abdominal pain.  No chest pain or shortness of breath.    Past Medical History:  Diagnosis Date  . Agent orange exposure   . Anxiety   . At risk for sleep apnea    STOP BANG SCORE= 5             SENT TO PCP 11-24-2017  . Chronic calcific pancreatitis ([place]) 2000  . Colon cancer [facility])    09/ 2016  sigmoid cancer  s/p  left hemicolectomy (negative nodes and margins)  . Depression   . GERD (gastroesophageal reflux disease)   . Hemorrhoids   . History of adenomatous polyp of colon   . Hyperlipidemia   . Hyperplasia of prostate with lower urinary tract symptoms (LUTS)   . Hypertension   . Lipoma   . Peripheral neuropathy   . Prostate cancer [facility]) urologist-  dr Tresa Moore  oncologist-  dr Tammi Klippel   dx 08-02-2017-- Stage T1c, Gleason, 3+4, PSA 5.07, vol 38cc--  scheduled for radioactive seed implants  11-29-2017  . Type 2 diabetes mellitus with insulin therapy ([place])    Pt reports he is Type 1 Diabetic-diagnosed at age 79/  followed by pcp  . Wears glasses     Patient Active Problem List   Diagnosis Date Noted  . Cervical spondylolysis 05/01/2020  . Bilateral low back pain with right-sided sciatica 04/10/2020  . Paresthesia 02/12/2020  . Hand muscle weakness 02/12/2020  . Malignant neoplasm of prostate ([place]) 08/25/2017  . Increased bowel frequency 08/25/2017  . Colon cancer ([place]) 03/29/2015    Past Surgical History:  Procedure Laterality Date  . COLONOSCOPY  last one 03-14-2015  . CYSTOSCOPY N/A 11/29/2017   Procedure: CYSTOSCOPY;  Surgeon: Alexis Frock, MD;  Location: [facility];  Service: Urology;  Laterality: N/A;  no seeds in bladder per Dr Tresa Moore  . EVALUATION UNDER ANESTHESIA WITH HEMORRHOIDECTOMY N/A 05/29/2015   Procedure: ANAL EXAM UNDER ANESTHESIA WITH  HEMORRHOIDOPEXY; RIGID SIGMOIDOSCOPY;  Surgeon: Leighton Ruff, MD;  Location: [place];  Service: General;  Laterality: N/A;  . LAPAROSCOPIC PARTIAL COLECTOMY N/A 03/29/2015   Procedure: LAPAROSCOPIC SIGMOIDECTOMY;  Surgeon: Leighton Ruff, MD;  Location: [facility];  Service: General;  Laterality: N/A;  . NASAL SEPTUM SURGERY  2006  . PROSTATE BIOPSY  08-02-2017  dr Tresa Moore office  . RADIOACTIVE  SEED IMPLANT N/A 11/29/2017   Procedure: RADIOACTIVE SEED IMPLANT/BRACHYTHERAPY IMPLANT;  Surgeon: Alexis Frock, MD;  Location: [facility];  Service: Urology;  Laterality: N/A;  . SPACE OAR INSTILLATION N/A 11/29/2017   Procedure: SPACE OAR INSTILLATION;  Surgeon: Alexis Frock, MD;  Location: [facility];  Service: Urology;  Laterality: N/A;       Family History  Problem Relation Age of Onset  . Hyperlipidemia Father   . Stroke Father   . Prostate cancer Father   . Heart attack Father   . Diabetes Maternal Uncle   . Cancer Brother        unknown blood cancer  .  Healthy Mother     Social History   Tobacco Use  . Smoking status: Former Smoker    Years: 30.00    Types: Cigarettes    Quit date: 06/26/2009    Years since quitting: 11.4  . Smokeless tobacco: Never Used  Vaping Use  . Vaping Use: Never used  Substance Use Topics  . Alcohol use: Not Currently  . Drug use: No    Home Medications Prior to Admission medications   Medication Sig Start Date End Date Taking? Authorizing Provider  ACCU-CHEK COMPACT PLUS test strip  11/20/17   [provider]  amLODipine (NORVASC) 10 MG tablet Take 10 mg by mouth every morning.     [provider]  aspirin 325 MG tablet Take 325 mg by mouth 2 (two) times a week. And takes as needed for headaches    [provider]  carboxymethylcellulose (REFRESH PLUS) 0.5 % SOLN Apply 1 drop to eye daily as needed (FOR EYE IRRITATION).     [provider]  celecoxib (CELEBREX) 200 MG capsule Take 200 mg by mouth daily. 02/05/20   [provider]  Evolocumab (REPATHA) 140 MG/ML SOSY Inject 140 mg into the skin every 14 (fourteen) days.    [provider]  fosinopril (MONOPRIL) 10 MG tablet Take 10 mg by mouth daily.    [provider]  hydrochlorothiazide (MICROZIDE) 12.5 MG capsule Take 12.5 mg by mouth daily.     [provider]  insulin aspart (NOVOLOG) 100 UNIT/ML injection Inject 30-45 Units into the skin daily. Via pump    [provider]  pantoprazole (PROTONIX) 40 MG tablet Take 40 mg by mouth every morning.     [provider]  tamsulosin (FLOMAX) 0.4 MG CAPS capsule Take 1 capsule (0.4 mg total) by mouth daily as needed. For urinary urgency after prostate radiation. Patient taking differently: Take 0.4 mg by mouth daily. 11/29/17   Alexis Frock, MD  traZODone (DESYREL) 50 MG tablet Take 50 mg by mouth at bedtime.    [provider]    Allergies    Atorvastatin, Other, Shellfish allergy, Toradol [ketorolac  tromethamine], and Adhesive [tape]  Review of Systems   Review of Systems  Constitutional: Positive for activity change, appetite change and fever. Negative for chills and fatigue.  HENT: Negative for trouble swallowing.   Respiratory: Negative for cough, shortness of breath and wheezing.   Cardiovascular: Negative for chest pain and palpitations.  Gastrointestinal: Negative for abdominal pain, constipation, diarrhea, nausea and vomiting.  Genitourinary: Negative for difficulty urinating, dysuria, flank pain and hematuria.  Musculoskeletal: Negative for arthralgias, back pain, myalgias, neck pain and neck stiffness.  Skin: Negative for rash.  Neurological: Negative for dizziness, syncope, weakness, numbness and headaches.  Hematological: Does not bruise/bleed easily.  Psychiatric/Behavioral: Positive for confusion.  Physical Exam Updated Vital Signs BP 116/69   Pulse 96   Temp (!) 102.7 F (39.3 C) (Rectal)   Resp (!) 26   Ht 6' (1.829 m)   Wt 90.7 kg   SpO2 93%   BMI 27.12 kg/m   Physical Exam Vitals and nursing note reviewed. Exam conducted with a chaperone present.  Constitutional:      General: He is not in acute distress.    Appearance: Normal appearance. He is ill-appearing.  HENT:     Mouth/Throat:     Mouth: Mucous membranes are moist.  Eyes:     Conjunctiva/sclera: Conjunctivae normal.     Pupils: Pupils are equal, round, and reactive to light.  Cardiovascular:     Rate and Rhythm: Normal rate and regular rhythm.     Pulses: Normal pulses.  Pulmonary:     Effort: Pulmonary effort is normal.     Breath sounds: Normal breath sounds.  Abdominal:     General: There is no distension.     Palpations: Abdomen is soft. There is no mass.     Tenderness: There is no abdominal tenderness.  Genitourinary:    Prostate: Not tender.     Rectum: No mass or tenderness. Normal anal tone.     Comments: Focal area of tenderness, mild erythema and induration of the  perineium and left lower buttock.  Non tender on DRE.  No palpable rectal masses.   Musculoskeletal:        General: Normal range of motion.     Cervical back: Normal range of motion. No tenderness.     Right lower leg: No edema.     Left lower leg: No edema.  Skin:    General: Skin is warm.     Capillary Refill: Capillary refill takes less than 2 seconds.     Findings: No rash.  Neurological:     Mental Status: He is alert. He is confused.     GCS: GCS eye subscore is 4. GCS verbal subscore is 5. GCS motor subscore is 6.     Sensory: No sensory deficit.     Motor: Motor function is intact. No weakness.     Comments: Patient oriented to person only.  Confused and inappropriately answering questions.     ED Results / Procedures / Treatments   Labs (all labs ordered are listed, but only abnormal results are displayed) Labs Reviewed  CBG MONITORING, ED - Abnormal; Notable for the following components:      Result Value   Glucose-Capillary 199 (*)    All other components within normal limits  CULTURE, BLOOD (SINGLE)  URINE CULTURE  RESP PANEL BY RT-PCR (FLU A&B, COVID) ARPGX2  LACTIC ACID, PLASMA  LACTIC ACID, PLASMA  COMPREHENSIVE METABOLIC PANEL  CBC WITH DIFFERENTIAL/PLATELET  PROTIME-INR  APTT  URINALYSIS, ROUTINE W REFLEX MICROSCOPIC    EKG EKG Interpretation  Date/Time:  Tuesday Nov 26 2020 16:16:21 EDT Ventricular Rate:  96 PR Interval:  150 QRS Duration: 96 QT Interval:  380 QTC Calculation: 480 R Axis:   -42 Text Interpretation: Normal sinus rhythm Left axis deviation Confirmed by Lajean Saver [phone]) on 11/26/2020 4:20:53 PM   Radiology No results found.  Procedures Procedures   Medications Ordered in ED Medications  lactated ringers bolus 1,000 mL (1,000 mLs Intravenous New Bag/Given 11/26/20 1825)  piperacillin-tazobactam (ZOSYN) IVPB 3.375 g (3.375 g Intravenous New Bag/Given 11/26/20 1825)  acetaminophen (TYLENOL) tablet 1,000 mg (1,000 mg Oral Given  11/26/20 1825)  ED Course  I have reviewed the triage vital signs and the nursing notes.  Pertinent labs & imaging results that were available during my care of the patient were reviewed by me and considered in my medical decision making (see chart for details).  CRITICAL CARE Performed by: Larissa Pegg Total critical care time: 35 minutes Critical care time was exclusive of separately billable procedures and treating other patients. Critical care was necessary to treat or prevent imminent or life-threatening deterioration. Critical care was time spent personally by me on the following activities: development of treatment plan with patient and/or surrogate as well as nursing, discussions with consultants, evaluation of patient's response to treatment, examination of patient, obtaining history from patient or surrogate, ordering and performing treatments and interventions, ordering and review of laboratory studies, ordering and review of radiographic studies, pulse oximetry and re-evaluation of patient's condition.     MDM Rules/Calculators/A&P                          Pt here with pain, erythema and induration of the perineum/ left lower buttock.  Febrile and confused.  Likely developing sepsis and perirectal abscess. No obvious drainable site.  Sepsis orders initiated and IVF's, Zosyn  No palpable rectal mass on DRE and abd is soft and non tender.  Initial BP good.  CT abd pelvis ordered.  Lab results pending.    Discussed findings with Eustaquio Maize, PA-C at end of shift.  Pt will likely need admission and possible surgery consult.     Final Clinical Impression(s) / ED Diagnoses Final diagnoses:  None    Rx / DC Orders ED Discharge Orders    None       Kem Parkinson, PA-C 11/26/20 1929    Lajean Saver, MD 11/27/20 1702

## 2020-11-26 NOTE — ED Triage Notes (Signed)
EMS reports pt has cyst in his R groin.  Reports had it drained approx 6 months ago.  Also had a procedure Reports fever started x 4 days.  Also reports 6 months ago he had a surgery on his neck to correct some pain and numbness in r arm.  Pt also c/o pain in lower back.  Denies any cough or sob.  CBG 208 per ems.  Pt alert and oriented x 4.  EMS says family said he has been "talking out of his head a little bit."  EMS reports bp hr and bp 086 systolic, temp 99.3.

## 2020-11-26 NOTE — ED Notes (Signed)
Burning with urination and lower back pain.

## 2020-11-26 NOTE — ED Notes (Signed)
Patient transported to CT 

## 2020-11-26 NOTE — ED Notes (Signed)
Date and time results received: 11/26/20 0731   Test: Lactic acid Critical Value: 2.0  Name of Provider Notified: Steinl  Orders Received? Or Actions Taken?: NA

## 2020-11-26 NOTE — ED Notes (Signed)
Wife is at the bedside and updated on plan of care.

## 2020-11-26 NOTE — ED Notes (Signed)
Pt gave verbal consent for MSE signature witnessed by Dennison Mascot .  Esig pad broken.  Work order placed.

## 2020-11-27 ENCOUNTER — Encounter (HOSPITAL_COMMUNITY)
Admission: EM | Disposition: A | Discharge status: Home / Self Care | Payer: Self-pay | Source: Home / Self Care | Attending: Internal Medicine

## 2020-11-27 ENCOUNTER — Inpatient Hospital Stay (HOSPITAL_COMMUNITY): Payer: Medicare Other | Admitting: Certified Registered"

## 2020-11-27 ENCOUNTER — Encounter (HOSPITAL_COMMUNITY): Payer: Self-pay | Admitting: Family Medicine

## 2020-11-27 DIAGNOSIS — E872 Acidosis: Secondary | ICD-10-CM

## 2020-11-27 DIAGNOSIS — A419 Sepsis, unspecified organism: Principal | ICD-10-CM

## 2020-11-27 DIAGNOSIS — N179 Acute kidney failure, unspecified: Secondary | ICD-10-CM

## 2020-11-27 DIAGNOSIS — K611 Rectal abscess: Secondary | ICD-10-CM

## 2020-11-27 DIAGNOSIS — R6520 Severe sepsis without septic shock: Secondary | ICD-10-CM

## 2020-11-27 DIAGNOSIS — E871 Hypo-osmolality and hyponatremia: Secondary | ICD-10-CM

## 2020-11-27 HISTORY — PX: INCISION AND DRAINAGE PERIRECTAL ABSCESS: SHX1804

## 2020-11-27 LAB — COMPREHENSIVE METABOLIC PANEL
ALT: 32 U/L (ref 0–44)
AST: 60 U/L — ABNORMAL HIGH (ref 15–41)
Albumin: 2.2 g/dL — ABNORMAL LOW (ref 3.5–5.0)
Alkaline Phosphatase: 68 U/L (ref 38–126)
Anion gap: 13 (ref 5–15)
BUN: 21 mg/dL (ref 8–23)
CO2: 21 mmol/L — ABNORMAL LOW (ref 22–32)
Calcium: 8.1 mg/dL — ABNORMAL LOW (ref 8.9–10.3)
Chloride: 99 mmol/L (ref 98–111)
Creatinine, Ser: 1.34 mg/dL — ABNORMAL HIGH (ref 0.61–1.24)
GFR, Estimated: 56 mL/min — ABNORMAL LOW (ref >60)
Glucose, Bld: 240 mg/dL — ABNORMAL HIGH (ref 70–99)
Potassium: 3.3 mmol/L — ABNORMAL LOW (ref 3.5–5.1)
Sodium: 133 mmol/L — ABNORMAL LOW (ref 135–145)
Total Bilirubin: 1.6 mg/dL — ABNORMAL HIGH (ref 0.3–1.2)
Total Protein: 5.4 g/dL — ABNORMAL LOW (ref 6.5–8.1)

## 2020-11-27 LAB — GLUCOSE, CAPILLARY
Glucose-Capillary: 229 mg/dL — ABNORMAL HIGH (ref 70–99)
Glucose-Capillary: 217 mg/dL — ABNORMAL HIGH (ref 70–99)
Glucose-Capillary: 237 mg/dL — ABNORMAL HIGH (ref 70–99)
Glucose-Capillary: 149 mg/dL — ABNORMAL HIGH (ref 70–99)
Glucose-Capillary: 152 mg/dL — ABNORMAL HIGH (ref 70–99)
Glucose-Capillary: 131 mg/dL — ABNORMAL HIGH (ref 70–99)

## 2020-11-27 LAB — CBC
HCT: 43.6 % (ref 39.0–52.0)
Hemoglobin: 14.5 g/dL (ref 13.0–17.0)
MCH: 30.1 pg (ref 26.0–34.0)
MCHC: 33.3 g/dL (ref 30.0–36.0)
MCV: 90.6 fL (ref 80.0–100.0)
Platelets: 189 10*3/uL (ref 150–400)
RBC: 4.81 MIL/uL (ref 4.22–5.81)
RDW: 13.4 % (ref 11.5–15.5)
WBC: 17.8 10*3/uL — ABNORMAL HIGH (ref 4.0–10.5)
nRBC: 0 % (ref 0.0–0.2)

## 2020-11-27 LAB — MAGNESIUM: Magnesium: 1.8 mg/dL (ref 1.7–2.4)

## 2020-11-27 LAB — HEMOGLOBIN A1C
Hgb A1c MFr Bld: 7.6 % — ABNORMAL HIGH (ref 4.8–5.6)
Mean Plasma Glucose: 171.42 mg/dL

## 2020-11-27 SURGERY — INCISION AND DRAINAGE, ABSCESS, PERIRECTAL
Anesthesia: General | Site: Anus

## 2020-11-27 MED ORDER — ONDANSETRON HCL 4 MG/2ML IJ SOLN: 4.0000 mg | Freq: Four times a day (QID) | INTRAMUSCULAR | Status: DC | PRN

## 2020-11-27 MED ORDER — CHLORHEXIDINE GLUCONATE 0.12 % MT SOLN
15.0000 mL | Freq: Once | OROMUCOSAL | Status: AC
  Administered 2020-11-27: 15 mL via OROMUCOSAL

## 2020-11-27 MED ORDER — POTASSIUM CHLORIDE 10 MEQ/100ML IV SOLN
10.0000 meq | INTRAVENOUS | Status: DC
  Filled 2020-11-27: qty 100

## 2020-11-27 MED ORDER — ONDANSETRON HCL 4 MG/2ML IJ SOLN
INTRAMUSCULAR | Status: AC
  Filled 2020-11-27: qty 2

## 2020-11-27 MED ORDER — ONDANSETRON HCL 4 MG/2ML IJ SOLN
INTRAMUSCULAR | Status: DC | PRN
  Administered 2020-11-27: 4 mg via INTRAVENOUS

## 2020-11-27 MED ORDER — POTASSIUM CHLORIDE 10 MEQ/100ML IV SOLN
10.0000 meq | INTRAVENOUS | Status: AC
  Administered 2020-11-27 (×4): 10 meq via INTRAVENOUS
  Filled 2020-11-27 (×3): qty 100

## 2020-11-27 MED ORDER — INSULIN ASPART 100 UNIT/ML IJ SOLN
0.0000 [IU] | Freq: Every day | INTRAMUSCULAR | Status: DC
  Administered 2020-11-27: 2 [IU] via SUBCUTANEOUS

## 2020-11-27 MED ORDER — FENTANYL CITRATE (PF) 100 MCG/2ML IJ SOLN
INTRAMUSCULAR | Status: AC
  Filled 2020-11-27: qty 2

## 2020-11-27 MED ORDER — INSULIN ASPART 100 UNIT/ML IJ SOLN
0.0000 [IU] | Freq: Three times a day (TID) | INTRAMUSCULAR | Status: DC
  Administered 2020-11-27: 5 [IU] via SUBCUTANEOUS
  Administered 2020-11-27: 3 [IU] via SUBCUTANEOUS
  Administered 2020-11-28: 2 [IU] via SUBCUTANEOUS

## 2020-11-27 MED ORDER — LACTATED RINGERS IV SOLN: INTRAVENOUS | Status: DC

## 2020-11-27 MED ORDER — BUPIVACAINE LIPOSOME 1.3 % IJ SUSP
INTRAMUSCULAR | Status: AC
  Filled 2020-11-27: qty 20

## 2020-11-27 MED ORDER — LIDOCAINE HCL (PF) 2 % IJ SOLN
INTRAMUSCULAR | Status: AC
  Filled 2020-11-27: qty 5

## 2020-11-27 MED ORDER — GLYCOPYRROLATE PF 0.2 MG/ML IJ SOSY
PREFILLED_SYRINGE | INTRAMUSCULAR | Status: AC
  Filled 2020-11-27: qty 1

## 2020-11-27 MED ORDER — EPHEDRINE SULFATE-NACL 50-0.9 MG/10ML-% IV SOSY
PREFILLED_SYRINGE | INTRAVENOUS | Status: DC | PRN
  Administered 2020-11-27: 5 mg via INTRAVENOUS

## 2020-11-27 MED ORDER — ONDANSETRON HCL 4 MG PO TABS: 4.0000 mg | ORAL_TABLET | Freq: Four times a day (QID) | ORAL | Status: DC | PRN

## 2020-11-27 MED ORDER — EVOLOCUMAB 140 MG/ML ~~LOC~~ SOSY: 140.0000 mg | PREFILLED_SYRINGE | SUBCUTANEOUS | Status: DC

## 2020-11-27 MED ORDER — SODIUM CHLORIDE FLUSH 0.9 % IV SOLN
INTRAVENOUS | Status: AC
  Filled 2020-11-27: qty 10

## 2020-11-27 MED ORDER — CHLORHEXIDINE GLUCONATE 0.12 % MT SOLN
OROMUCOSAL | Status: AC
  Filled 2020-11-27: qty 15

## 2020-11-27 MED ORDER — BUPIVACAINE LIPOSOME 1.3 % IJ SUSP
INTRAMUSCULAR | Status: DC | PRN
  Administered 2020-11-27: 20 mL

## 2020-11-27 MED ORDER — 0.9 % SODIUM CHLORIDE (POUR BTL) OPTIME
TOPICAL | Status: DC | PRN
Start: 2020-11-27 — End: 2020-11-27
  Administered 2020-11-27: 1000 mL

## 2020-11-27 MED ORDER — CHLORHEXIDINE GLUCONATE CLOTH 2 % EX PADS
6.0000 | MEDICATED_PAD | Freq: Once | CUTANEOUS | Status: AC
  Administered 2020-11-27: 6 via TOPICAL

## 2020-11-27 MED ORDER — AMLODIPINE BESYLATE 5 MG PO TABS
10.0000 mg | ORAL_TABLET | Freq: Every morning | ORAL | Status: DC
  Filled 2020-11-27 (×2): qty 2

## 2020-11-27 MED ORDER — FENTANYL CITRATE (PF) 100 MCG/2ML IJ SOLN: 25.0000 ug | INTRAMUSCULAR | Status: DC | PRN

## 2020-11-27 MED ORDER — BUPIVACAINE HCL (PF) 0.5 % IJ SOLN
INTRAMUSCULAR | Status: AC
  Filled 2020-11-27: qty 30

## 2020-11-27 MED ORDER — PROSOURCE PLUS PO LIQD
30.0000 mL | Freq: Two times a day (BID) | ORAL | Status: DC
  Administered 2020-11-28 – 2020-11-30 (×6): 30 mL via ORAL
  Filled 2020-11-27 (×6): qty 30

## 2020-11-27 MED ORDER — LISINOPRIL 10 MG PO TABS
10.0000 mg | ORAL_TABLET | Freq: Every day | ORAL | Status: DC
  Filled 2020-11-27 (×2): qty 1

## 2020-11-27 MED ORDER — OXYCODONE HCL 5 MG PO TABS
5.0000 mg | ORAL_TABLET | ORAL | Status: DC | PRN
  Administered 2020-11-28 – 2020-11-30 (×2): 5 mg via ORAL
  Filled 2020-11-27 (×2): qty 1

## 2020-11-27 MED ORDER — PROPOFOL 10 MG/ML IV BOLUS
INTRAVENOUS | Status: AC
  Filled 2020-11-27: qty 40

## 2020-11-27 MED ORDER — EPHEDRINE 5 MG/ML INJ
INTRAVENOUS | Status: AC
  Filled 2020-11-27: qty 10

## 2020-11-27 MED ORDER — GLYCOPYRROLATE PF 0.2 MG/ML IJ SOSY
PREFILLED_SYRINGE | INTRAMUSCULAR | Status: DC | PRN
  Administered 2020-11-27 (×2): .1 mg via INTRAVENOUS

## 2020-11-27 MED ORDER — PHENYLEPHRINE 40 MCG/ML (10ML) SYRINGE FOR IV PUSH (FOR BLOOD PRESSURE SUPPORT)
PREFILLED_SYRINGE | INTRAVENOUS | Status: DC | PRN
Start: 2020-11-27 — End: 2020-11-27
  Administered 2020-11-27 (×3): 120 ug via INTRAVENOUS
  Administered 2020-11-27: 80 ug via INTRAVENOUS
  Administered 2020-11-27: 120 ug via INTRAVENOUS
  Administered 2020-11-27: 80 ug via INTRAVENOUS
  Administered 2020-11-27: 120 ug via INTRAVENOUS

## 2020-11-27 MED ORDER — ACETAMINOPHEN 325 MG PO TABS
650.0000 mg | ORAL_TABLET | Freq: Four times a day (QID) | ORAL | Status: DC | PRN
  Administered 2020-11-29: 650 mg via ORAL
  Filled 2020-11-27: qty 2

## 2020-11-27 MED ORDER — LIDOCAINE VISCOUS HCL 2 % MT SOLN
OROMUCOSAL | Status: AC
  Filled 2020-11-27: qty 15

## 2020-11-27 MED ORDER — PROPOFOL 10 MG/ML IV BOLUS
INTRAVENOUS | Status: DC | PRN
  Administered 2020-11-27: 130 mg via INTRAVENOUS

## 2020-11-27 MED ORDER — INSULIN DETEMIR 100 UNIT/ML ~~LOC~~ SOLN
20.0000 [IU] | Freq: Every day | SUBCUTANEOUS | Status: DC
  Administered 2020-11-27: 20 [IU] via SUBCUTANEOUS
  Filled 2020-11-27 (×3): qty 0.2

## 2020-11-27 MED ORDER — TAMSULOSIN HCL 0.4 MG PO CAPS
0.4000 mg | ORAL_CAPSULE | Freq: Every day | ORAL | Status: DC
  Administered 2020-11-27 – 2020-11-30 (×4): 0.4 mg via ORAL
  Filled 2020-11-27 (×4): qty 1

## 2020-11-27 MED ORDER — FENTANYL CITRATE (PF) 100 MCG/2ML IJ SOLN
INTRAMUSCULAR | Status: DC | PRN
  Administered 2020-11-27: 25 ug via INTRAVENOUS
  Administered 2020-11-27 (×2): 50 ug via INTRAVENOUS
  Administered 2020-11-27: 25 ug via INTRAVENOUS

## 2020-11-27 MED ORDER — HEPARIN SODIUM (PORCINE) 5000 UNIT/ML IJ SOLN
5000.0000 [IU] | Freq: Three times a day (TID) | INTRAMUSCULAR | Status: DC
  Administered 2020-11-27 – 2020-11-30 (×10): 5000 [IU] via SUBCUTANEOUS
  Filled 2020-11-27 (×10): qty 1

## 2020-11-27 MED ORDER — MORPHINE SULFATE (PF) 2 MG/ML IV SOLN
2.0000 mg | INTRAVENOUS | Status: DC | PRN
  Administered 2020-11-27 – 2020-11-30 (×10): 2 mg via INTRAVENOUS
  Filled 2020-11-27 (×11): qty 1

## 2020-11-27 MED ORDER — TRAZODONE HCL 50 MG PO TABS
50.0000 mg | ORAL_TABLET | Freq: Every day | ORAL | Status: DC
  Administered 2020-11-28 – 2020-11-29 (×2): 50 mg via ORAL
  Filled 2020-11-27 (×3): qty 1

## 2020-11-27 MED ORDER — PHENYLEPHRINE 40 MCG/ML (10ML) SYRINGE FOR IV PUSH (FOR BLOOD PRESSURE SUPPORT)
PREFILLED_SYRINGE | INTRAVENOUS | Status: AC
  Filled 2020-11-27: qty 10

## 2020-11-27 MED ORDER — ENSURE ENLIVE PO LIQD
237.0000 mL | Freq: Two times a day (BID) | ORAL | Status: DC
  Administered 2020-11-28 – 2020-11-30 (×3): 237 mL via ORAL

## 2020-11-27 MED ORDER — ASPIRIN 325 MG PO TABS
325.0000 mg | ORAL_TABLET | ORAL | Status: DC
  Administered 2020-11-28: 325 mg via ORAL
  Filled 2020-11-27: qty 1

## 2020-11-27 MED ORDER — LIDOCAINE 2% (20 MG/ML) 5 ML SYRINGE
INTRAMUSCULAR | Status: DC | PRN
  Administered 2020-11-27: 80 mg via INTRAVENOUS

## 2020-11-27 MED ORDER — SODIUM CHLORIDE 0.9 % IV SOLN
2.0000 g | INTRAVENOUS | Status: AC
  Administered 2020-11-27: 2 g via INTRAVENOUS
  Filled 2020-11-27: qty 2

## 2020-11-27 MED ORDER — OXYCODONE HCL 5 MG PO TABS: 5.0000 mg | ORAL_TABLET | ORAL | Status: DC | PRN

## 2020-11-27 MED ORDER — SODIUM CHLORIDE 0.9 % IV SOLN: INTRAVENOUS | Status: DC

## 2020-11-27 MED ORDER — ORAL CARE MOUTH RINSE: 15.0000 mL | Freq: Once | OROMUCOSAL | Status: AC

## 2020-11-27 MED ORDER — ONDANSETRON HCL 4 MG/2ML IJ SOLN: 4.0000 mg | Freq: Once | INTRAMUSCULAR | Status: DC | PRN

## 2020-11-27 MED ORDER — ACETAMINOPHEN 650 MG RE SUPP: 650.0000 mg | Freq: Four times a day (QID) | RECTAL | Status: DC | PRN

## 2020-11-27 SURGICAL SUPPLY — 23 items
BAG HAMPER (MISCELLANEOUS) ×2 IMPLANT
BLADE 15 SAFETY STRL DISP (BLADE) ×2 IMPLANT
CLOTH BEACON ORANGE TIMEOUT ST (SAFETY) ×2 IMPLANT
COVER LIGHT HANDLE STERIS (MISCELLANEOUS) ×4 IMPLANT
COVER WAND RF STERILE (DRAPES) ×2 IMPLANT
DRAPE HALF SHEET 40X57 (DRAPES) ×2 IMPLANT
ELECT REM PT RETURN 9FT ADLT (ELECTROSURGICAL) ×2
ELECTRODE REM PT RTRN 9FT ADLT (ELECTROSURGICAL) ×1 IMPLANT
GAUZE SPONGE 4X4 12PLY STRL (GAUZE/BANDAGES/DRESSINGS) ×4 IMPLANT
GLOVE SURG ENC MOIS LTX SZ6.5 (GLOVE) ×2 IMPLANT
GLOVE SURG UNDER POLY LF SZ6.5 (GLOVE) ×2 IMPLANT
GLOVE SURG UNDER POLY LF SZ7 (GLOVE) ×4 IMPLANT
GOWN STRL REUS W/TWL LRG LVL3 (GOWN DISPOSABLE) ×4 IMPLANT
KIT TURNOVER KIT A (KITS) ×2 IMPLANT
MANIFOLD NEPTUNE II (INSTRUMENTS) ×2 IMPLANT
NS IRRIG 1000ML POUR BTL (IV SOLUTION) ×2 IMPLANT
PACK MINOR (CUSTOM PROCEDURE TRAY) ×2 IMPLANT
PAD ARMBOARD 7.5X6 YLW CONV (MISCELLANEOUS) ×2 IMPLANT
SET BASIN LINEN APH (SET/KITS/TRAYS/PACK) ×2 IMPLANT
SUT ETHILON 3 0 FSL (SUTURE) ×2 IMPLANT
SUT SILK 2 0 (SUTURE) ×1
SUT SILK 2-0 18XBRD TIE 12 (SUTURE) ×1 IMPLANT
SYR 20ML LL LF (SYRINGE) ×2 IMPLANT

## 2020-11-27 NOTE — Op Note (Addendum)
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 Constance Haw, MD   Assistants: No qualified resident was available    Anesthesia: General endotracheal   Anesthesiologist: Louann Sjogren, MD    Specimens:  Culture to pathology    Estimated Blood Loss: Minimal   Blood Replacement: None    Complications: None   Wound Class: Dirty infected    Operative Indications:  Mr. Rudd is a 75 yo with findings of a perirectal abscess on CT scan and signs of swelling and fluctuance on exam. He is incontinent at baseline after prostate cancer and seeds, lap sigmoid colectomy for colon cancer and hemorrhoidectomy.  We discussed I&D of the abscess, possible drain placement and possible seton if there was a fistula.   Findings: Fistula form left perianal region to verge, difficult to appreciate if sphincter involved, abscess cavity 5+cm in size   Procedure: The patient was taken to the operating room and placed supine. General endotracheal anesthesia was induced. Intravenous antibiotics were administered per protocol.  He was then placed in lithotomy with pressure points padded. The anus and perineal region were prepared and draped in the usual sterile fashion.   A digital exam demonstrated no masses and no obvious internal fluctuance. There was significant liquid stool evacuated. The left perianal region there was fluctuance. Using an anoscope I examined the internal anal canal and injected peroxide into the abscess cavity. There was an area of peroxide drainage left of posterior midline just below the dentate line. I opened the abscess cavity with a scalp on the left perianal area, and cultures were  obtained. The cavity was opened up with a hemostat, and a lacrimal probe was used to find the connection into the anal canal. A red rubber seton was placed into the fistula track and secured with two 2-0 silk sutures to itself.  The abscess cavity was irrigated. A penrose drain was cut in half and the two pieces were placed into the cavity and secured with 3-0 Nylon suture to allow for drainage without need to pack the large cavity. Hemostasis was obtained.  Mesh underwear and ABD were placed.   Final inspection revealed acceptable hemostasis. All counts were correct at the end of the case. The patient was awakened from anesthesia and extubated without complication.  The patient went to the PACU in stable condition.   Curlene Labrum, MD [facility] [address] [place], [place] [address] [phone] (office)

## 2020-11-27 NOTE — Progress Notes (Signed)
Inpatient Diabetes Program Recommendations  AACE/ADA: New Consensus Statement on Inpatient Glycemic Control   Target Ranges:  Prepandial:   less than 140 mg/dL      Peak postprandial:   less than 180 mg/dL (1-2 hours)      Critically ill patients:  140 - 180 mg/dL   Results for Anthony [name], Anthony [name] (MRN [id number]) as of 11/27/2020 13:09  Ref. Range 11/26/2020 16:07 11/26/2020 23:04 11/27/2020 01:39 11/27/2020 08:17 11/27/2020 10:57 11/27/2020 12:47  Glucose-Capillary Latest Ref Range: 70 - 99 mg/dL 199 (H) 229 (H) 229 (H) 217 (H) 237 (H) 149 (H)   Review of Glycemic Control  Diabetes history: DM2 Outpatient Diabetes medications: OmniPod insulin pump with Novolog Current orders for Inpatient glycemic control: Levemir 20 units QHS, Novolog 0-15 units TID with meals, Novolog 0-5 units QHS  Inpatient Diabetes Program Recommendations:    Insulin: Please consider increasing Levemir to 25 units QHS, decrease Novolog correction to 0-9 units TID with meals, and when diet ordered please add Novolog 4 units TID with meals for meal coverage if patient eats at least 50% of meals.  NOTE: Noted patient is currently in OR at this time. Per chart, patient has DM2, uses an insulin pump outpatient for DM management, and is followed by Dr. Chalmers Cater (Endocrinologist). Called Dr. Almetta Lovely office and spoke with Lovena Le and given patient's insulin pump settings which are as follows:  Basal: 12A 0.2 units/hour 6A  0.3 units/hour 12P 8.0 units/hour 8P 0.25 units/hour Total basal in 24 hours: 68 units Insulin Sensitivity 1:45-50 mg/dl (1 unit drops 45-50 mg/dl) Insulin to Carb Ratio 1:12 grams (1 unit covers 12 grams of carbs)  Patient received Levemir 20 units last night and fasting glucose 217 mg/dl this morning.   Thanks, Barnie Alderman, RN, MSN, CDE Diabetes Coordinator Inpatient Diabetes Program [phone] (Team Pager from 8am to 5pm)

## 2020-11-27 NOTE — Progress Notes (Signed)
Care started prior to midnight in the ED and Anthony [name] was admitted early this AM by Dr. Carlynn Purl and I am in current agreement with her Assessment and Plan. Additional changes to the plan of care have been made accordingly. The Anthony [name] is a 75 yo overweigh AAM with a PMH significant for but not limited too T2DM, Prostate Cancer, HTN, HLD, GERD, Chronic Calcified Pancreatitis as well as other comorbidities who presented with CC of back pain.  Anthony [name] has had chronic back pain but that has and has not thought much of it but reportedly 2 days ago noticed a bump in his perineum.  Reports the bump in his perineum gradually became larger and more painful.  He describes a pressure and describes the pain as sharp and constant last 2 days and states is progressively gotten worse.  He states it has not affected his bowel movements but he does have fecal incontinence 25 daily and has been having this for years.  He has not noticed any drainage or bleeding from his bowel.  He also reports that he has a stoma that is "filling back up".  States that this is been aspirated twice with both UA 200 mL at a time.  In the ED he is found to be septic with a temperature of 102.2, respirations were 29 and heart rate of 103.  Leukocytosis was 14.6.  He was admitted for severe sepsis in the setting of 26 cm of perineal abscess with IntraStent icteric and subacute components with possibility of progressive fasciitis.  Currently he is being admitted for and treated for the following but not limited to:  Severe sepsis present on admission secondary to perirectal abscess with concern for Fournier's gangrene -Anthony [name] met SIRS criteria on admission with a temperature of 102.7, pulse rate of 103, respiratory rate of 29, and a WBC of 14.6 which is now trended up to 17.8 -Anthony [name]'s lactic acid level was 2.0 on admission does characterizing severe sepsis -General surgery has been consulted and plan to take the Anthony [name] to the OR for  incision and drainage -Anthony [name] received 4 L boluses of the ED of lactated Ringer's and is now on maintenance IV fluids with normal saline at 100 MLS per hour -Pressures remain on the softer side so we will hold his antihypertensives for now including his lisinopril and amlodipine -Urinalysis on admission showed moderate hemoglobin and 20 ketones but negative leukocytes, negative nitrites and no bacteria seen -Blood culture and urine culture still pending -CT scan of the abdomen pelvis done and showed "26 cubic cm left perianal abscess with intersphincteric and subcutaneous components. There is also edema tracking along the perineum, and very careful clinical surveillance to exclude the possibility of a progressive fasciitis/Fournier's gangrene is recommended. Substantial enlargement in the simple appearing cystic lesion along the right groin region, currently 200 cubic cm and previously 12 cubic cm. This could represent a seroma, lymphangioma, or ganglion cyst.  Subtle sclerosis in the left iliac bone compared to the right. Although the findings are subtle, strictly speaking I cannot exclude prostate metastatic lesion, and correlation with PSA level and/or bone scan would be suggested. 4. Other imaging findings of potential clinical significance: Mild cardiomegaly. Coronary atherosclerosis. Probable hepatic steatosis. Chronic calcific pancreatitis. Renal sinus lipomatosis. Aortic Atherosclerosis (ICD10-I70.0). Seed implants in the prostate gland." -Continue IV antibiotic coverage with IV Zosyn for now every 8 hours -Continue supportive care and continue with antiemetics with ondansetron 4 mg p.o./IV every 6 hours as needed nausea -We will also  continue pain control with IV morphine 2 mg every 2 hours as needed severe pain, p.o. oxycodone IR 5 mg every 4 hours as needed for moderate pain as well as acetaminophen 650 mg p.o./RC every 6 as needed for mild pain or fever  Hyponatremia -Mild -Anthony [name] sodium  was 133; currently getting normal saline at 100 MLS per hour -Continue to monitor and trend and repeat CMP in a.m.  Hypokalemia -Anthony [name]'s potassium was 2.8 on admission has improved to 3.3 -Replete with IV KCl 40 mEQ x1 -Mag Level was 1.8 -Continue monitor and trend and replete as necessary -Repeat CMP in a.m.  AKI, improving  Metabolic acidosis -Presented with a BUNs/creatinine of 31/1.67; baseline creatinine of 1.10 -Likely was prerenal -Anthony [name] has a small metabolic acidosis with a CO2 of 21, anion gap of 13, chloride level of 99 -Currently getting normal saline at 100 MLS per hour -Avoid nephrotoxic medications, contrast dyes, hypotension renally dose medications -Blood pressure was on the softer side so we will hold his amlodipine and lisinopril for now -Repeat CMP in a.m.  Hyperbilirubinemia -Anthony [name] is to bili went from 1.2 is now 1.6 -Likely reactive we will continue to monitor and trend -Repeat CMP in a.m.  Hyperlipidemia -Anthony [name] likely statin intolerant and continues Evolocumab SOSY 140 mg sq q14days  Perirectal Abscess -As above; General Surgery consulted and plans to take the Anthony [name] for I&D this a.m.  Mild protein calorie malnutrition -Anthony [name] is currently n.p.o. for procedure tomorrow, consider adding protein shakes when Anthony [name] is able to tolerate p.o. -Will consult Nutritionist for further evaluation and recommendations  Diabetes Mellitus Type 2 -Continue long-acting insulin and short acting sliding scale -DC home insulin pump while hospitalized -Last hemoglobin A1c was documented in her system was 7.6 -Repeat hemoglobin A1c while Anthony [name] is hospitalized here -Will consult Diabetes Education coordinator for further blood sugar assistance and management  -CBG's Ranging from [id number]  History of prostate cancer -Diagnosed in August 02, 2017 as stage I TC Gleason 3+4 -PSA at that time was 5.07 -He is status post radioactive seed implants on 11/29/2017 -We  will check PSA again given findings on CT scan as above as they showed "Subtle sclerosis in the left iliac bone compared to the right. Although the findings are subtle, strictly speaking I cannot exclude prostate metastatic lesion, and correlation with PSA level and/or bone scan would be suggested."  We will continue to monitor the Anthony [name]'s clinical response to intervention and repeat blood work in the a.m. and follow-up on general surgery recommendations

## 2020-11-27 NOTE — Consult Note (Signed)
[facility] Consult  Reason for Consult: Perianal abscess  Referring Physician:  Dr. Alfredia Ferguson    HPI: Anthony [name] is a 75 y.o. male with GERD, chronic pancreatitis prior colon cancer s/p lap sigmoid colectomy, prostate cancer s/p seeds, DM, who comes in with back pain and rectal pain with perianal swelling and tenderness. He says he has chronic back pain so this was sort of disregarded until he started having pain and tenderness in the rectum. He has incontinence at baseline since around 2016 with his prostate surgery, colectomy and hemorrhoid surgery. He wears depends. CT shows perianal abscess and he is septic with a leukocytosis and some confusion.  He is oriented X 3 for me but his wife helps him answer some questions.   Past Medical History:  Diagnosis Date  . Agent orange exposure   . Anxiety   . At risk for sleep apnea    STOP BANG SCORE= 5             SENT TO PCP 11-24-2017  . Chronic calcific pancreatitis ([place]) 2000  . Colon cancer [facility])    09/ 2016  sigmoid cancer  s/p  left hemicolectomy (negative nodes and margins)  . Depression   . GERD (gastroesophageal reflux disease)   . Hemorrhoids   . History of adenomatous polyp of colon   . Hyperlipidemia   . Hyperplasia of prostate with lower urinary tract symptoms (LUTS)   . Hypertension   . Lipoma   . Peripheral neuropathy   . Prostate cancer [facility]) urologist-  dr Tresa Moore  oncologist-  dr Tammi Klippel   dx 08-02-2017-- Stage T1c, Gleason, 3+4, PSA 5.07, vol 38cc--  scheduled for radioactive seed implants 11-29-2017  . Type 2 diabetes mellitus with insulin therapy ([place])    Pt reports he is Type 1 Diabetic-diagnosed at age 46/  followed by pcp  . Wears glasses     Past Surgical History:  Procedure Laterality Date  . COLONOSCOPY  last one 03-14-2015  . CYSTOSCOPY N/A 11/29/2017   Procedure: CYSTOSCOPY;  Surgeon: Alexis Frock, MD;  Location: [facility];  Service: Urology;  Laterality: N/A;  no seeds  in bladder per Dr Tresa Moore  . EVALUATION UNDER ANESTHESIA WITH HEMORRHOIDECTOMY N/A 05/29/2015   Procedure: ANAL EXAM UNDER ANESTHESIA WITH  HEMORRHOIDOPEXY; RIGID SIGMOIDOSCOPY;  Surgeon: Leighton Ruff, MD;  Location: [facility];  Service: General;  Laterality: N/A;  . LAPAROSCOPIC PARTIAL COLECTOMY N/A 03/29/2015   Procedure: LAPAROSCOPIC SIGMOIDECTOMY;  Surgeon: Leighton Ruff, MD;  Location: [facility];  Service: General;  Laterality: N/A;  . NASAL SEPTUM SURGERY  2006  . PROSTATE BIOPSY  08-02-2017  dr Tresa Moore office  . RADIOACTIVE SEED IMPLANT N/A 11/29/2017   Procedure: RADIOACTIVE SEED IMPLANT/BRACHYTHERAPY IMPLANT;  Surgeon: Alexis Frock, MD;  Location: [facility];  Service: Urology;  Laterality: N/A;  . SPACE OAR INSTILLATION N/A 11/29/2017   Procedure: SPACE OAR INSTILLATION;  Surgeon: Alexis Frock, MD;  Location: [facility];  Service: Urology;  Laterality: N/A;    Family History  Problem Relation Age of Onset  . Hyperlipidemia Father   . Stroke Father   . Prostate cancer Father   . Heart attack Father   . Diabetes Maternal Uncle   . Cancer Brother        unknown blood cancer  . Healthy Mother     Social History   Tobacco Use  . Smoking status: Former Smoker    Years: 30.00  Types: Cigarettes    Quit date: 06/26/2009    Years since quitting: 11.4  . Smokeless tobacco: Never Used  Vaping Use  . Vaping Use: Never used  Substance Use Topics  . Alcohol use: Not Currently  . Drug use: No    Medications:  I have reviewed the patient's current medications. Prior to Admission:  Medications Prior to Admission  Medication Sig Dispense Refill Last Dose  . amLODipine (NORVASC) 10 MG tablet Take 10 mg by mouth every morning.    11/26/2020 at Unknown time  . aspirin 325 MG tablet Take 325 mg by mouth 2 (two) times a week. And takes as needed for headaches   PRN  . carboxymethylcellulose (REFRESH PLUS) 0.5 % SOLN Apply 1 drop to eye  daily as needed (FOR EYE IRRITATION).    PRN  . celecoxib (CELEBREX) 200 MG capsule Take 200 mg by mouth daily.   More than a month  . Evolocumab (REPATHA) 140 MG/ML SOSY Inject 140 mg into the skin every 14 (fourteen) days.   11/14/2020  . fosinopril (MONOPRIL) 10 MG tablet Take 10 mg by mouth daily.   11/26/2020 at Unknown time  . hydrochlorothiazide (MICROZIDE) 12.5 MG capsule Take 12.5 mg by mouth daily.    11/26/2020 at Unknown time  . insulin aspart (NOVOLOG) 100 UNIT/ML injection Inject 30-45 Units into the skin daily as needed for high blood sugar. Via pump   PRN  . pantoprazole (PROTONIX) 40 MG tablet Take 40 mg by mouth every morning.    More than a month  . tamsulosin (FLOMAX) 0.4 MG CAPS capsule Take 1 capsule (0.4 mg total) by mouth daily as needed. For urinary urgency after prostate radiation. (Patient taking differently: Take 0.4 mg by mouth daily.) 30 capsule 11 11/25/2020 at Unknown time  . traZODone (DESYREL) 50 MG tablet Take 50 mg by mouth at bedtime.   11/25/2020   Scheduled: . [MAR Hold] amLODipine  10 mg Oral q morning  . [MAR Hold] aspirin  325 mg Oral Once per day on Mon Thu  . chlorhexidine      . [MAR Hold] Evolocumab  140 mg Subcutaneous Q14 Days  . [MAR Hold] heparin  5,000 Units Subcutaneous Q8H  . [MAR Hold] insulin aspart  0-15 Units Subcutaneous TID WC  . [MAR Hold] insulin aspart  0-5 Units Subcutaneous QHS  . [MAR Hold] insulin detemir  20 Units Subcutaneous QHS  . [MAR Hold] lisinopril  10 mg Oral Daily  . [MAR Hold] tamsulosin  0.4 mg Oral Daily  . [MAR Hold] traZODone  50 mg Oral QHS   Continuous: . sodium chloride 100 mL/hr at 11/27/20 0124  . cefoTEtan (CEFOTAN) IV    . lactated ringers 10 mL/hr at 11/27/20 1109  . [MAR Hold] piperacillin-tazobactam (ZOSYN)  IV 3.375 g (11/27/20 0943)  . [MAR Hold] potassium chloride     PRN:0.9 % irrigation (POUR BTL), [MAR Hold] acetaminophen **OR** [MAR Hold] acetaminophen, [MAR Hold]  morphine injection, [MAR Hold]  ondansetron **OR** [MAR Hold] ondansetron (ZOFRAN) IV, [MAR Hold] oxyCODONE  Allergies  Allergen Reactions  . Atorvastatin Other (See Comments)    Reports elevated liver enzymes.  . Other Other (See Comments)    Positive allergy test for peanuts and almonds  . Shellfish Allergy Other (See Comments)    Positive allergy test  . Toradol [Ketorolac Tromethamine] Other (See Comments)    Pt has chronic calcific pancreatitis  . Adhesive [Tape] Rash    Tegaderm  ROS:  A comprehensive review of systems was negative except for: Gastrointestinal: positive for perianal pain and swelling  Blood pressure 107/65, pulse 85, temperature 99.1 F (37.3 C), temperature source Oral, resp. rate 18, height 6' (1.829 m), weight 90.7 kg, SpO2 92 %. Physical Exam Vitals reviewed.  Constitutional:      Appearance: He is ill-appearing.  HENT:     Head: Normocephalic.     Nose: Nose normal.  Eyes:     Extraocular Movements: Extraocular movements intact.  Cardiovascular:     Rate and Rhythm: Normal rate.  Pulmonary:     Effort: Pulmonary effort is normal.  Abdominal:     General: There is no distension.     Palpations: Abdomen is soft.     Tenderness: There is no abdominal tenderness.  Genitourinary:    Comments: No obvious external hemorrhoids, he has a left perianal abscess with fluctuance, tender  Musculoskeletal:        General: Normal range of motion.     Comments: Right groin seroma / cyst that is soft, no erythema, no prior scar noted   Skin:    General: Skin is warm.  Neurological:     General: No focal deficit present.     Mental Status: He is alert and oriented to person, place, and time.  Psychiatric:        Mood and Affect: Mood normal.     Comments: Little confused/ sleepy     Results: Results for orders placed or performed during the hospital encounter of 11/26/20 (from the past 48 hour(s))  CBG monitoring, ED     Status: Abnormal   Collection Time: 11/26/20  4:07 PM   Result Value Ref Range   Glucose-Capillary 199 (H) 70 - 99 mg/dL    Comment: Glucose reference range applies only to samples taken after fasting for at least 8 hours.  Urinalysis, Routine w reflex microscopic     Status: Abnormal   Collection Time: 11/26/20  5:48 PM  Result Value Ref Range   Color, Urine YELLOW YELLOW   APPearance CLEAR CLEAR   Specific Gravity, Urine 1.019 1.005 - 1.030   pH 5.0 5.0 - 8.0   Glucose, UA NEGATIVE NEGATIVE mg/dL   Hgb urine dipstick MODERATE (A) NEGATIVE   Bilirubin Urine NEGATIVE NEGATIVE   Ketones, ur 20 (A) NEGATIVE mg/dL   Protein, ur 30 (A) NEGATIVE mg/dL   Nitrite NEGATIVE NEGATIVE   Leukocytes,Ua NEGATIVE NEGATIVE   RBC / HPF 0-5 0 - 5 RBC/hpf   WBC, UA 0-5 0 - 5 WBC/hpf   Bacteria, UA NONE SEEN NONE SEEN   Mucus PRESENT    Amorphous Crystal PRESENT     Comment: Performed at [facility], [address], [place], [place] [address]  Resp Panel by RT-PCR (Flu A&B, Covid)     Status: None   Collection Time: 11/26/20  5:55 PM   Specimen: Nasopharyngeal(NP) swabs in vial transport medium  Result Value Ref Range   SARS Coronavirus 2 by RT PCR NEGATIVE NEGATIVE    Comment: (NOTE) SARS-CoV-2 target nucleic acids are NOT DETECTED.  The SARS-CoV-2 RNA is generally detectable in upper respiratory specimens during the acute phase of infection. The lowest concentration of SARS-CoV-2 viral copies this assay can detect is 138 copies/mL. A negative result does not preclude SARS-Cov-2 infection and should not be used as the sole basis for treatment or other patient management decisions. A negative result may occur with  improper specimen collection/handling, submission of specimen  other than nasopharyngeal swab, presence of viral mutation(s) within the areas targeted by this assay, and inadequate number of viral copies(<138 copies/mL). A negative result must be combined with clinical observations, patient history, and epidemiological information. The  expected result is Negative.  Fact Sheet for Patients:  EntrepreneurPulse.com.au  Fact Sheet for Healthcare Providers:  IncredibleEmployment.be  This test is no t yet approved or cleared by the [place] FDA and  has been authorized for detection and/or diagnosis of SARS-CoV-2 by FDA under an Emergency Use Authorization (EUA). This EUA will remain  in effect (meaning this test can be used) for the duration of the COVID-19 declaration under Section 564(b)(1) of the Act, 21 U.S.C.section 360bbb-3(b)(1), unless the authorization is terminated  or revoked sooner.       Influenza A by PCR NEGATIVE NEGATIVE   Influenza B by PCR NEGATIVE NEGATIVE    Comment: (NOTE) The Xpert Xpress SARS-CoV-2/FLU/RSV plus assay is intended as an aid in the diagnosis of influenza from Nasopharyngeal swab specimens and should not be used as a sole basis for treatment. Nasal washings and aspirates are unacceptable for Xpert Xpress SARS-CoV-2/FLU/RSV testing.  Fact Sheet for Patients: EntrepreneurPulse.com.au  Fact Sheet for Healthcare Providers: IncredibleEmployment.be  This test is not yet approved or cleared by the [place] FDA and has been authorized for detection and/or diagnosis of SARS-CoV-2 by FDA under an Emergency Use Authorization (EUA). This EUA will remain in effect (meaning this test can be used) for the duration of the COVID-19 declaration under Section 564(b)(1) of the Act, 21 U.S.C. section 360bbb-3(b)(1), unless the authorization is terminated or revoked.  Performed at [facility], [address] [place], [place] [address]   Lactic acid, plasma     Status: Abnormal   Collection Time: 11/26/20  6:14 PM  Result Value Ref Range   Lactic Acid, Venous 2.0 (HH) 0.5 - 1.9 mmol/L    Comment: CRITICAL RESULT CALLED TO, READ BACK BY AND VERIFIED WITH: THOMPSON,R ON 11/26/20 AT 1930 BY LOY,C Performed at [facility], [address], [place], [place] [address]   Comprehensive metabolic panel     Status: Abnormal   Collection Time: 11/26/20  6:14 PM  Result Value Ref Range   Sodium 133 (L) 135 - 145 mmol/L   Potassium 2.8 (L) 3.5 - 5.1 mmol/L   Chloride 95 (L) 98 - 111 mmol/L   CO2 25 22 - 32 mmol/L   Glucose, Bld 185 (H) 70 - 99 mg/dL    Comment: Glucose reference range applies only to samples taken after fasting for at least 8 hours.   BUN 31 (H) 8 - 23 mg/dL   Creatinine, Ser 1.67 (H) 0.61 - 1.24 mg/dL   Calcium 8.6 (L) 8.9 - 10.3 mg/dL   Total Protein 7.0 6.5 - 8.1 g/dL   Albumin 2.9 (L) 3.5 - 5.0 g/dL   AST 74 (H) 15 - 41 U/L   ALT 35 0 - 44 U/L   Alkaline Phosphatase 84 38 - 126 U/L   Total Bilirubin 1.2 0.3 - 1.2 mg/dL   GFR, Estimated 43 (L) >60 mL/min    Comment: (NOTE) Calculated using the CKD-EPI Creatinine Equation (2021)    Anion gap 13 5 - 15    Comment: Performed at [facility], [address], [place],  [address]  CBC WITH DIFFERENTIAL     Status: Abnormal   Collection Time: 11/26/20  6:14 PM  Result Value Ref Range   WBC 14.6 (H) 4.0 -  10.5 K/uL   RBC 5.47 4.22 - 5.81 MIL/uL   Hemoglobin 16.4 13.0 - 17.0 g/dL   HCT 95.2 84.1 - 32.4 %   MCV 89.6 80.0 - 100.0 fL   MCH 30.0 26.0 - 34.0 pg   MCHC 33.5 30.0 - 36.0 g/dL   RDW 40.1 02.7 - 25.3 %   Platelets 200 150 - 400 K/uL   nRBC 0.0 0.0 - 0.2 %   Neutrophils Relative % 81 %   Neutro Abs 11.8 (H) 1.7 - 7.7 K/uL   Lymphocytes Relative 7 %   Lymphs Abs 1.0 0.7 - 4.0 K/uL   Monocytes Relative 11 %   Monocytes Absolute 1.7 (H) 0.1 - 1.0 K/uL   Eosinophils Relative 0 %   Eosinophils Absolute 0.0 0.0 - 0.5 K/uL   Basophils Relative 0 %   Basophils Absolute 0.0 0.0 - 0.1 K/uL   Immature Granulocytes 1 %   Abs Immature Granulocytes 0.12 (H) 0.00 - 0.07 K/uL    Comment: Performed at [facility], [address] [place], [place] [address]  Protime-INR     Status: None   Collection Time: 11/26/20  6:14 PM   Result Value Ref Range   Prothrombin Time 14.3 11.4 - 15.2 seconds   INR 1.1 0.8 - 1.2    Comment: (NOTE) INR goal varies based on device and disease states. Performed at [facility], [address], [place], [place] [address]   APTT     Status: None   Collection Time: 11/26/20  6:14 PM  Result Value Ref Range   aPTT 25 24 - 36 seconds    Comment: Performed at [facility], [address], [place], [place] [address]  Blood culture (routine single)     Status: None (Preliminary result)   Collection Time: 11/26/20  6:14 PM   Specimen: BLOOD  Result Value Ref Range   Specimen Description BLOOD RIGHT ANTECUBITAL    Special Requests      BOTTLES DRAWN AEROBIC AND ANAEROBIC Blood Culture adequate volume   Culture      NO GROWTH < 12 HOURS Performed at [facility], [address], [place], [place] [address]    Report Status PENDING   Magnesium     Status: None   Collection Time: 11/26/20 10:25 PM  Result Value Ref Range   Magnesium 2.1 1.7 - 2.4 mg/dL    Comment: Performed at [facility], [address], [place], [place] [address]  Lactic acid, plasma     Status: None   Collection Time: 11/26/20 11:03 PM  Result Value Ref Range   Lactic Acid, Venous 1.5 0.5 - 1.9 mmol/L    Comment: Performed at [facility], [address], [place], [place] [address]  CBG monitoring, ED     Status: Abnormal   Collection Time: 11/26/20 11:04 PM  Result Value Ref Range   Glucose-Capillary 229 (H) 70 - 99 mg/dL    Comment: Glucose reference range applies only to samples taken after fasting for at least 8 hours.  Glucose, capillary     Status: Abnormal   Collection Time: 11/27/20  1:39 AM  Result Value Ref Range   Glucose-Capillary 229 (H) 70 - 99 mg/dL    Comment: Glucose reference range applies only to samples taken after fasting for at least 8 hours.  Magnesium     Status: None   Collection Time: 11/27/20  3:53 AM  Result Value Ref Range   Magnesium 1.8 1.7 - 2.4 mg/dL  Comment: Performed at  [facility], [address] [place], [place] [address]  CBC     Status: Abnormal   Collection Time: 11/27/20  3:53 AM  Result Value Ref Range   WBC 17.8 (H) 4.0 - 10.5 K/uL   RBC 4.81 4.22 - 5.81 MIL/uL   Hemoglobin 14.5 13.0 - 17.0 g/dL   HCT 43.6 39.0 - 52.0 %   MCV 90.6 80.0 - 100.0 fL   MCH 30.1 26.0 - 34.0 pg   MCHC 33.3 30.0 - 36.0 g/dL   RDW 13.4 11.5 - 15.5 %   Platelets 189 150 - 400 K/uL   nRBC 0.0 0.0 - 0.2 %    Comment: Performed at [facility], [address], [place], [place] [address]  Comprehensive metabolic panel     Status: Abnormal   Collection Time: 11/27/20  3:53 AM  Result Value Ref Range   Sodium 133 (L) 135 - 145 mmol/L   Potassium 3.3 (L) 3.5 - 5.1 mmol/L   Chloride 99 98 - 111 mmol/L   CO2 21 (L) 22 - 32 mmol/L   Glucose, Bld 240 (H) 70 - 99 mg/dL    Comment: Glucose reference range applies only to samples taken after fasting for at least 8 hours.   BUN 21 8 - 23 mg/dL   Creatinine, Ser 1.34 (H) 0.61 - 1.24 mg/dL   Calcium 8.1 (L) 8.9 - 10.3 mg/dL   Total Protein 5.4 (L) 6.5 - 8.1 g/dL   Albumin 2.2 (L) 3.5 - 5.0 g/dL   AST 60 (H) 15 - 41 U/L   ALT 32 0 - 44 U/L   Alkaline Phosphatase 68 38 - 126 U/L   Total Bilirubin 1.6 (H) 0.3 - 1.2 mg/dL   GFR, Estimated 56 (L) >60 mL/min    Comment: (NOTE) Calculated using the CKD-EPI Creatinine Equation (2021)    Anion gap 13 5 - 15    Comment: Performed at [facility], [address], [place], [place] [address]  Glucose, capillary     Status: Abnormal   Collection Time: 11/27/20  8:17 AM  Result Value Ref Range   Glucose-Capillary 217 (H) 70 - 99 mg/dL    Comment: Glucose reference range applies only to samples taken after fasting for at least 8 hours.  Glucose, capillary     Status: Abnormal   Collection Time: 11/27/20 10:57 AM  Result Value Ref Range   Glucose-Capillary 237 (H) 70 - 99 mg/dL    Comment: Glucose reference range applies only to samples taken after fasting for at least 8 hours.    Personally reviewed- abscess around left perianal region, right groin seroma noted on CT CT ABDOMEN PELVIS W CONTRAST  Result Date: 11/26/2020 CLINICAL DATA:  Abdominal abscess along the peroneal/rectal region. Chronic pancreatitis. History of prostate and colon cancer EXAM: CT ABDOMEN AND PELVIS WITH CONTRAST TECHNIQUE: Multidetector CT imaging of the abdomen and pelvis was performed using the standard protocol following bolus administration of intravenous contrast. CONTRAST:  70mL OMNIPAQUE IOHEXOL 300 MG/ML  SOLN COMPARISON:  MRI pelvis 02/26/2020 and CT abdomen/pelvis 03/14/2015 FINDINGS: Lower chest: Gynecomastia. Mild cardiomegaly. Right coronary artery atherosclerotic vascular calcification. Dependent subsegmental atelectasis in the posterior basal segments of both lower lobes. Hepatobiliary: Probable hepatic steatosis. Mildly contracted gallbladder. Streak artifact from electronic device posterior to the patient partially obscures the upper abdomen. Pancreas: Coarse calcifications in the pancreatic parenchyma compatible with chronic calcific pancreatitis. Spleen: Unremarkable Adrenals/Urinary Tract: Mild bilateral renal sinus lipomatosis. Urinary bladder unremarkable. No  urinary tract calculi are identified. The adrenal glands appear normal. 0.6 cm left kidney upper pole hypodense lesion is likely a cyst although technically too small to characterize, unchanged from 2016. Stomach/Bowel: Anastomotic staple line in the distal sigmoid colon. Normal appendix. Left eccentric intersphincteric perianal abscess along the left lower buttock likely extending into the subcutaneous tissues, measuring about 4.1 by 2.7 by 4.4 cm (volume = 26 cm^3), but also with a linear 3.2 cm extension cephalad along the posterior margin of the external sphincter. There is adjacent subcutaneous edema along the perineum. Vascular/Lymphatic: Aortoiliac atherosclerotic vascular disease. No pathologic adenopathy identified.  Reproductive: Brachytherapy seed implants in the small prostate gland. Other: Chronic 7.1 by 5.9 by 9.2 cm (volume = 200 cm^3) simple cystic lesion along the right groin, substantially increased in size from 03/14/2015 where this measured 3.0 by 1.9 by 3.9 cm (volume = 12 cm^3). Possibilities include seroma, lymphangioma, or ganglion cyst. Musculoskeletal: Bridging spurring of the left sacroiliac joint. Subtle sclerosis in the left iliac bone for example on image 62 series 2, increased from prior. Degenerative endplate sclerosis at the L3-4 level. IMPRESSION: 1. 26 cubic cm left perianal abscess with intersphincteric and subcutaneous components. There is also edema tracking along the perineum, and very careful clinical surveillance to exclude the possibility of a progressive fasciitis/Fournier's gangrene is recommended. 2. Substantial enlargement in the simple appearing cystic lesion along the right groin region, currently 200 cubic cm and previously 12 cubic cm. This could represent a seroma, lymphangioma, or ganglion cyst. 3. Subtle sclerosis in the left iliac bone compared to the right. Although the findings are subtle, strictly speaking I cannot exclude prostate metastatic lesion, and correlation with PSA level and/or bone scan would be suggested. 4. Other imaging findings of potential clinical significance: Mild cardiomegaly. Coronary atherosclerosis. Probable hepatic steatosis. Chronic calcific pancreatitis. Renal sinus lipomatosis. Aortic Atherosclerosis (ICD10-I70.0). Seed implants in the prostate gland. Electronically Signed   By: Van Clines M.D.   On: 11/26/2020 20:33   DG Chest Port 1 View  Result Date: 11/26/2020 CLINICAL DATA:  Fever for 4 days, sepsis EXAM: PORTABLE CHEST 1 VIEW COMPARISON:  10/06/2017 FINDINGS: 2 frontal views of the chest demonstrate an unremarkable cardiac silhouette. No acute airspace disease, effusion, or pneumothorax. No acute bony abnormalities. IMPRESSION: 1. No  acute intrathoracic process. Electronically Signed   By: Randa Ngo M.D.   On: 11/26/2020 18:45     Assessment & Plan:  Anthony [name] is a 75 y.o. male with perianal abscess on CT scan and leukocytosis and fevers. He needs exam under anesthesia and drainage. We discussed this and risk of bleeding, infection, fistula or need for additional surgery. He is already incontinent. Discussed possible drain or seton placement. Discussed he could get sicker before he gets better. He denies any cardiac issues. He has a chronic cyst/ seroma on is right thigh seen on CT. He has had this aspirated before and says it came back. He had imaging and aspiration of the seroma in 02/2020 and cells were non malignant.   -Plan for I&D of the perianal abscess. Wife and family and patient agree.  -COVID negative.   All questions were answered to the satisfaction of the patient and family.    Virl Cagey 11/27/2020, 11:12 AM

## 2020-11-27 NOTE — Anesthesia Procedure Notes (Signed)
Procedure Name: LMA Insertion Date/Time: 11/27/2020 11:42 AM Performed by: Orlie Dakin, CRNA Pre-anesthesia Checklist: Patient identified, Emergency Drugs available, Suction available and Patient being monitored Patient Re-evaluated:Patient Re-evaluated prior to induction Oxygen Delivery Method: Circle system utilized Preoxygenation: Pre-oxygenation with 100% oxygen Induction Type: IV induction LMA: LMA inserted LMA Size: 5.0 Tube type: Oral Number of attempts: 1 Placement Confirmation: positive ETCO2 Tube secured with: Tape Dental Injury: Teeth and Oropharynx as per pre-operative assessment

## 2020-11-27 NOTE — H&P (Signed)
TRH H&P    Patient Demographics:    Anthony [name], is a 75 y.o. male  MRN: [id number]  DOB - Sep 15, 1945  Admit Date - 11/26/2020  Referring MD/NP/PA: Ashok Cordia   Outpatient Primary MD for the patient is Merrilee Seashore, MD  Patient coming from: Home  Chief complaint- back pain   HPI:    Anthony [name]  is a 75 y.o. male, with history of T2DM, prostate cancer, HTN, HLD, GERD, Chronic calcified pancreatitis, and more presents to the ED with a chief complaint of back pain. Patient has had chronic back pain in the past, so he didn't think much of it. Patient reports that 2 days ago he noticed a bump in his perineum. He reports that the bump gradually became larger and more painful. The pain is pressure and sharp, and it is constant. 2 days after the pain started, it was so bad, he couldn't get out of bed. IT's worse with movement, and better with rest. He reports that it has not affected his BMs as he has fecal incontinence 25 times per day - per his report. This has been ongoing for years. He hasn't noticed any drainage or bleeding from this bump. He denies any fevers. He reports fatigue, weakness, and decreased appetite that has been ongoing since 2016. Patient reports that he also has a seroma that's "filling back up." He has had it aspirated twice, and both times 263ml  Of fluid were collected - per his report.   Patient reports no other complaints at this time.   He is not currently drinking, does not smoke, and does not use illicit drugs. Patient reports that the last drink was 1 week ago. He normally has 2 beers per day.   In the ED T 102.2, HR 67-103, R 17-27, BP 109/63 maintaining sats on room air Leukocytosis of 14.6, Hgb 16.4 Chemistry - Na 133, K + 2.8, BUN and Cr elevated at 31 and 1.67 resp panel pending Urine is not indicative of UTI Urine culture pending Blood culture pending CT abd/Pelvis - 26 cubic cm  left perianal abscess with intersphincteric and sub q components. Enlargement of cystic lesion in the right groin as well Patient given tylenol and started on zosyn Potassium replacement started    Review of systems:    In addition to the HPI above,  No Fever-chills, No Headache, No changes with Vision or hearing, No problems swallowing food or Liquids, No Chest pain, Cough or Shortness of Breath, No Blood in stool or Urine, No dysuria, No new joints pains-aches,  No new weakness, tingling, numbness in any extremity, No recent weight gain or loss, No polyuria, polydypsia or polyphagia, No significant Mental Stressors.  All other systems reviewed and are negative.    Past History of the following :    Past Medical History:  Diagnosis Date  . Agent orange exposure   . Anxiety   . At risk for sleep apnea    STOP BANG SCORE= 5  SENT TO PCP 11-24-2017  . Chronic calcific pancreatitis ([place]) 2000  . Colon cancer [facility])    09/ 2016  sigmoid cancer  s/p  left hemicolectomy (negative nodes and margins)  . Depression   . GERD (gastroesophageal reflux disease)   . Hemorrhoids   . History of adenomatous polyp of colon   . Hyperlipidemia   . Hyperplasia of prostate with lower urinary tract symptoms (LUTS)   . Hypertension   . Lipoma   . Peripheral neuropathy   . Prostate cancer [facility]) urologist-  dr Tresa Moore  oncologist-  dr Tammi Klippel   dx 08-02-2017-- Stage T1c, Gleason, 3+4, PSA 5.07, vol 38cc--  scheduled for radioactive seed implants 11-29-2017  . Type 2 diabetes mellitus with insulin therapy ([facility])    Pt reports he is Type 1 Diabetic-diagnosed at age 39/  followed by pcp  . Wears glasses       Past Surgical History:  Procedure Laterality Date  . COLONOSCOPY  last one 03-14-2015  . CYSTOSCOPY N/A 11/29/2017   Procedure: CYSTOSCOPY;  Surgeon: Alexis Frock, MD;  Location: [facility];  Service: Urology;  Laterality: N/A;  no seeds in bladder per Dr Tresa Moore   . EVALUATION UNDER ANESTHESIA WITH HEMORRHOIDECTOMY N/A 05/29/2015   Procedure: ANAL EXAM UNDER ANESTHESIA WITH  HEMORRHOIDOPEXY; RIGID SIGMOIDOSCOPY;  Surgeon: Leighton Ruff, MD;  Location: [place];  Service: General;  Laterality: N/A;  . LAPAROSCOPIC PARTIAL COLECTOMY N/A 03/29/2015   Procedure: LAPAROSCOPIC SIGMOIDECTOMY;  Surgeon: Leighton Ruff, MD;  Location: [facility];  Service: General;  Laterality: N/A;  . NASAL SEPTUM SURGERY  2006  . PROSTATE BIOPSY  08-02-2017  dr Tresa Moore office  . RADIOACTIVE SEED IMPLANT N/A 11/29/2017   Procedure: RADIOACTIVE SEED IMPLANT/BRACHYTHERAPY IMPLANT;  Surgeon: Alexis Frock, MD;  Location: [facility];  Service: Urology;  Laterality: N/A;  . SPACE OAR INSTILLATION N/A 11/29/2017   Procedure: SPACE OAR INSTILLATION;  Surgeon: Alexis Frock, MD;  Location: [facility];  Service: Urology;  Laterality: N/A;      Social History:      Social History   Tobacco Use  . Smoking status: Former Smoker    Years: 30.00    Types: Cigarettes    Quit date: 06/26/2009    Years since quitting: 11.4  . Smokeless tobacco: Never Used  Substance Use Topics  . Alcohol use: Not Currently       Family History :     Family History  Problem Relation Age of Onset  . Hyperlipidemia Father   . Stroke Father   . Prostate cancer Father   . Heart attack Father   . Diabetes Maternal Uncle   . Cancer Brother        unknown blood cancer  . Healthy Mother       Home Medications:   Prior to Admission medications   Medication Sig Start Date End Date Taking? Authorizing Provider  amLODipine (NORVASC) 10 MG tablet Take 10 mg by mouth every morning.    Yes [provider]  fosinopril (MONOPRIL) 10 MG tablet Take 10 mg by mouth daily.   Yes [provider]  hydrochlorothiazide (MICROZIDE) 12.5 MG capsule Take 12.5 mg by mouth daily.    Yes [provider]  tamsulosin (FLOMAX) 0.4 MG CAPS capsule  Take 1 capsule (0.4 mg total) by mouth daily as needed. For urinary urgency after prostate radiation. Patient taking differently: Take 0.4 mg by mouth daily. 11/29/17  Yes Alexis Frock, MD  ACCU-CHEK COMPACT PLUS test strip  11/20/17   [provider]  aspirin 325 MG tablet Take 325 mg by mouth 2 (two) times a week. And takes as needed for headaches    [provider]  carboxymethylcellulose (REFRESH PLUS) 0.5 % SOLN Apply 1 drop to eye daily as needed (FOR EYE IRRITATION).     [provider]  celecoxib (CELEBREX) 200 MG capsule Take 200 mg by mouth daily. 02/05/20   [provider]  Evolocumab (REPATHA) 140 MG/ML SOSY Inject 140 mg into the skin every 14 (fourteen) days.    [provider]  insulin aspart (NOVOLOG) 100 UNIT/ML injection Inject 30-45 Units into the skin daily. Via pump    [provider]  pantoprazole (PROTONIX) 40 MG tablet Take 40 mg by mouth every morning.     [provider]  traZODone (DESYREL) 50 MG tablet Take 50 mg by mouth at bedtime.    [provider]     Allergies:     Allergies  Allergen Reactions  . Atorvastatin Other (See Comments)    Reports elevated liver enzymes.  . Other Other (See Comments)    Positive allergy test for peanuts and almonds  . Shellfish Allergy Other (See Comments)    Positive allergy test  . Toradol [Ketorolac Tromethamine] Other (See Comments)    Pt has chronic calcific pancreatitis  . Adhesive [Tape] Rash    Tegaderm      Physical Exam:   Vitals  Blood pressure 103/88, pulse 82, temperature 98.4 F (36.9 C), temperature source Oral, resp. rate 18, height 6' (1.829 m), weight 90.7 kg, SpO2 96 %.  1.  General:  Patient laying supine in bed, changing position frequently  2. Psychiatric: Mood and behavior normal for situation, memory intact, cooperative with exam  3. Neurologic: Face is symmetric, speech and language are normal, moves all 4 extremities  voluntarily  4. HEENMT:  Head is atraumatic, normocephalic, neck is supple, trachea is midline, mucous membranes moist  5. Respiratory : Lungs are clear to auscultation BL, no wheezing, rales, or rhonchi, no increase in work of breathing  6. Cardiovascular : Heart rate is normal, rhythm is regular, no murmurs rubs or gallops  7. Gastrointestinal:  Abdomen is soft, nondistended, nontender to palpation  8. Skin:  Skin is warm dry and intact without acute lesion on limited exam  9.Musculoskeletal:  Fluid-filled lesion at the right groin/proximal medial side of right leg, soft, nontender to palpation, no peripheral edema, no acute deformity, chronic muscle atrophy left hand    Data Review:    CBC Recent Labs  Lab 11/26/20 1814  WBC 14.6*  HGB 16.4  HCT 49.0  PLT 200  MCV 89.6  MCH 30.0  MCHC 33.5  RDW 13.5  LYMPHSABS 1.0  MONOABS 1.7*  EOSABS 0.0  BASOSABS 0.0   ------------------------------------------------------------------------------------------------------------------  Results for orders placed or performed during the hospital encounter of 11/26/20 (from the past 48 hour(s))  CBG monitoring, ED     Status: Abnormal   Collection Time: 11/26/20  4:07 PM  Result Value Ref Range   Glucose-Capillary 199 (H) 70 - 99 mg/dL    Comment: Glucose reference range applies only to samples taken after fasting for at least 8 hours.  Urinalysis, Routine w reflex microscopic     Status: Abnormal   Collection Time: 11/26/20  5:48 PM  Result Value Ref Range   Color, Urine YELLOW YELLOW   APPearance CLEAR CLEAR   Specific Gravity, Urine  1.019 1.005 - 1.030   pH 5.0 5.0 - 8.0   Glucose, UA NEGATIVE NEGATIVE mg/dL   Hgb urine dipstick MODERATE (A) NEGATIVE   Bilirubin Urine NEGATIVE NEGATIVE   Ketones, ur 20 (A) NEGATIVE mg/dL   Protein, ur 30 (A) NEGATIVE mg/dL   Nitrite NEGATIVE NEGATIVE   Leukocytes,Ua NEGATIVE NEGATIVE   RBC / HPF 0-5 0 - 5 RBC/hpf   WBC, UA 0-5 0 - 5  WBC/hpf   Bacteria, UA NONE SEEN NONE SEEN   Mucus PRESENT    Amorphous Crystal PRESENT     Comment: Performed at [facility], [address], [place], [place] [address]  Resp Panel by RT-PCR (Flu A&B, Covid)     Status: None   Collection Time: 11/26/20  5:55 PM   Specimen: Nasopharyngeal(NP) swabs in vial transport medium  Result Value Ref Range   SARS Coronavirus 2 by RT PCR NEGATIVE NEGATIVE    Comment: (NOTE) SARS-CoV-2 target nucleic acids are NOT DETECTED.  The SARS-CoV-2 RNA is generally detectable in upper respiratory specimens during the acute phase of infection. The lowest concentration of SARS-CoV-2 viral copies this assay can detect is 138 copies/mL. A negative result does not preclude SARS-Cov-2 infection and should not be used as the sole basis for treatment or other patient management decisions. A negative result may occur with  improper specimen collection/handling, submission of specimen other than nasopharyngeal swab, presence of viral mutation(s) within the areas targeted by this assay, and inadequate number of viral copies(<138 copies/mL). A negative result must be combined with clinical observations, patient history, and epidemiological information. The expected result is Negative.  Fact Sheet for Patients:  BloggerCourse.com  Fact Sheet for Healthcare Providers:  SeriousBroker.it  This test is no t yet approved or cleared by the [place] FDA and  has been authorized for detection and/or diagnosis of SARS-CoV-2 by FDA under an Emergency Use Authorization (EUA). This EUA will remain  in effect (meaning this test can be used) for the duration of the COVID-19 declaration under Section 564(b)(1) of the Act, 21 U.S.C.section 360bbb-3(b)(1), unless the authorization is terminated  or revoked sooner.       Influenza A by PCR NEGATIVE NEGATIVE   Influenza B by PCR NEGATIVE NEGATIVE    Comment: (NOTE) The  Xpert Xpress SARS-CoV-2/FLU/RSV plus assay is intended as an aid in the diagnosis of influenza from Nasopharyngeal swab specimens and should not be used as a sole basis for treatment. Nasal washings and aspirates are unacceptable for Xpert Xpress SARS-CoV-2/FLU/RSV testing.  Fact Sheet for Patients: BloggerCourse.com  Fact Sheet for Healthcare Providers: SeriousBroker.it  This test is not yet approved or cleared by the [place] FDA and has been authorized for detection and/or diagnosis of SARS-CoV-2 by FDA under an Emergency Use Authorization (EUA). This EUA will remain in effect (meaning this test can be used) for the duration of the COVID-19 declaration under Section 564(b)(1) of the Act, 21 U.S.C. section 360bbb-3(b)(1), unless the authorization is terminated or revoked.  Performed at [facility], [address] [place], [place] [address]   Lactic acid, plasma     Status: Abnormal   Collection Time: 11/26/20  6:14 PM  Result Value Ref Range   Lactic Acid, Venous 2.0 (HH) 0.5 - 1.9 mmol/L    Comment: CRITICAL RESULT CALLED TO, READ BACK BY AND VERIFIED WITH: THOMPSON,R ON 11/26/20 AT 1930 BY LOY,C Performed at [facility], [address] [place], [place] [address]   Comprehensive metabolic panel  Status: Abnormal   Collection Time: 11/26/20  6:14 PM  Result Value Ref Range   Sodium 133 (L) 135 - 145 mmol/L   Potassium 2.8 (L) 3.5 - 5.1 mmol/L   Chloride 95 (L) 98 - 111 mmol/L   CO2 25 22 - 32 mmol/L   Glucose, Bld 185 (H) 70 - 99 mg/dL    Comment: Glucose reference range applies only to samples taken after fasting for at least 8 hours.   BUN 31 (H) 8 - 23 mg/dL   Creatinine, Ser 1.67 (H) 0.61 - 1.24 mg/dL   Calcium 8.6 (L) 8.9 - 10.3 mg/dL   Total Protein 7.0 6.5 - 8.1 g/dL   Albumin 2.9 (L) 3.5 - 5.0 g/dL   AST 74 (H) 15 - 41 U/L   ALT 35 0 - 44 U/L   Alkaline Phosphatase 84 38 - 126 U/L   Total Bilirubin 1.2  0.3 - 1.2 mg/dL   GFR, Estimated 43 (L) >60 mL/min    Comment: (NOTE) Calculated using the CKD-EPI Creatinine Equation (2021)    Anion gap 13 5 - 15    Comment: Performed at [facility], [address] [place], [place] [address]  CBC WITH DIFFERENTIAL     Status: Abnormal   Collection Time: 11/26/20  6:14 PM  Result Value Ref Range   WBC 14.6 (H) 4.0 - 10.5 K/uL   RBC 5.47 4.22 - 5.81 MIL/uL   Hemoglobin 16.4 13.0 - 17.0 g/dL   HCT 49.0 39.0 - 52.0 %   MCV 89.6 80.0 - 100.0 fL   MCH 30.0 26.0 - 34.0 pg   MCHC 33.5 30.0 - 36.0 g/dL   RDW 13.5 11.5 - 15.5 %   Platelets 200 150 - 400 K/uL   nRBC 0.0 0.0 - 0.2 %   Neutrophils Relative % 81 %   Neutro Abs 11.8 (H) 1.7 - 7.7 K/uL   Lymphocytes Relative 7 %   Lymphs Abs 1.0 0.7 - 4.0 K/uL   Monocytes Relative 11 %   Monocytes Absolute 1.7 (H) 0.1 - 1.0 K/uL   Eosinophils Relative 0 %   Eosinophils Absolute 0.0 0.0 - 0.5 K/uL   Basophils Relative 0 %   Basophils Absolute 0.0 0.0 - 0.1 K/uL   Immature Granulocytes 1 %   Abs Immature Granulocytes 0.12 (H) 0.00 - 0.07 K/uL    Comment: Performed at [facility], [address], [place], [place] [address]  Protime-INR     Status: None   Collection Time: 11/26/20  6:14 PM  Result Value Ref Range   Prothrombin Time 14.3 11.4 - 15.2 seconds   INR 1.1 0.8 - 1.2    Comment: (NOTE) INR goal varies based on device and disease states. Performed at [facility], [address] [place], [place] [address]   APTT     Status: None   Collection Time: 11/26/20  6:14 PM  Result Value Ref Range   aPTT 25 24 - 36 seconds    Comment: Performed at [facility], [address], [place], [place] [address]  Magnesium     Status: None   Collection Time: 11/26/20 10:25 PM  Result Value Ref Range   Magnesium 2.1 1.7 - 2.4 mg/dL    Comment: Performed at [facility], [address], [place], [place] [address]  Lactic acid, plasma     Status: None   Collection Time: 11/26/20 11:03 PM  Result Value Ref  Range   Lactic Acid, Venous 1.5 0.5 -  1.9 mmol/L    Comment: Performed at [facility], [address] [place], [place] [address]  CBG monitoring, ED     Status: Abnormal   Collection Time: 11/26/20 11:04 PM  Result Value Ref Range   Glucose-Capillary 229 (H) 70 - 99 mg/dL    Comment: Glucose reference range applies only to samples taken after fasting for at least 8 hours.    Chemistries  Recent Labs  Lab 11/26/20 1814 11/26/20 2225  NA 133*  --   K 2.8*  --   CL 95*  --   CO2 25  --   GLUCOSE 185*  --   BUN 31*  --   CREATININE 1.67*  --   CALCIUM 8.6*  --   MG  --  2.1  AST 74*  --   ALT 35  --   ALKPHOS 84  --   BILITOT 1.2  --    ------------------------------------------------------------------------------------------------------------------  ------------------------------------------------------------------------------------------------------------------ GFR: Estimated Creatinine Clearance: 43.9 mL/min (A) (by C-G formula based on SCr of 1.67 mg/dL (H)). Liver Function Tests: Recent Labs  Lab 11/26/20 1814  AST 74*  ALT 35  ALKPHOS 84  BILITOT 1.2  PROT 7.0  ALBUMIN 2.9*   No results for input(s): LIPASE, AMYLASE in the last 168 hours. No results for input(s): AMMONIA in the last 168 hours. Coagulation Profile: Recent Labs  Lab 11/26/20 1814  INR 1.1   Cardiac Enzymes: No results for input(s): CKTOTAL, CKMB, CKMBINDEX, TROPONINI in the last 168 hours. BNP (last 3 results) No results for input(s): PROBNP in the last 8760 hours. HbA1C: No results for input(s): HGBA1C in the last 72 hours. CBG: Recent Labs  Lab 11/26/20 1607 11/26/20 2304  GLUCAP 199* 229*   Lipid Profile: No results for input(s): CHOL, HDL, LDLCALC, TRIG, CHOLHDL, LDLDIRECT in the last 72 hours. Thyroid Function Tests: No results for input(s): TSH, T4TOTAL, FREET4, T3FREE, THYROIDAB in the last 72 hours. Anemia Panel: No results for input(s): VITAMINB12, FOLATE, FERRITIN,  TIBC, IRON, RETICCTPCT in the last 72 hours.  --------------------------------------------------------------------------------------------------------------- Urine analysis:    Component Value Date/Time   COLORURINE YELLOW 11/26/2020 [place] 11/26/2020 1748   LABSPEC 1.019 11/26/2020 1748   PHURINE 5.0 11/26/2020 1748   GLUCOSEU NEGATIVE 11/26/2020 1748   HGBUR MODERATE (A) 11/26/2020 1748   BILIRUBINUR NEGATIVE 11/26/2020 1748   KETONESUR 20 (A) 11/26/2020 1748   PROTEINUR 30 (A) 11/26/2020 1748   NITRITE NEGATIVE 11/26/2020 1748   LEUKOCYTESUR NEGATIVE 11/26/2020 1748      Imaging Results:    CT ABDOMEN PELVIS W CONTRAST  Result Date: 11/26/2020 CLINICAL DATA:  Abdominal abscess along the peroneal/rectal region. Chronic pancreatitis. History of prostate and colon cancer EXAM: CT ABDOMEN AND PELVIS WITH CONTRAST TECHNIQUE: Multidetector CT imaging of the abdomen and pelvis was performed using the standard protocol following bolus administration of intravenous contrast. CONTRAST:  80mL OMNIPAQUE IOHEXOL 300 MG/ML  SOLN COMPARISON:  MRI pelvis 02/26/2020 and CT abdomen/pelvis 03/14/2015 FINDINGS: Lower chest: Gynecomastia. Mild cardiomegaly. Right coronary artery atherosclerotic vascular calcification. Dependent subsegmental atelectasis in the posterior basal segments of both lower lobes. Hepatobiliary: Probable hepatic steatosis. Mildly contracted gallbladder. Streak artifact from electronic device posterior to the patient partially obscures the upper abdomen. Pancreas: Coarse calcifications in the pancreatic parenchyma compatible with chronic calcific pancreatitis. Spleen: Unremarkable Adrenals/Urinary Tract: Mild bilateral renal sinus lipomatosis. Urinary bladder unremarkable. No urinary tract calculi are identified. The adrenal glands appear normal. 0.6 cm left kidney upper pole hypodense lesion is  likely a cyst although technically too small to characterize, unchanged  from 2016. Stomach/Bowel: Anastomotic staple line in the distal sigmoid colon. Normal appendix. Left eccentric intersphincteric perianal abscess along the left lower buttock likely extending into the subcutaneous tissues, measuring about 4.1 by 2.7 by 4.4 cm (volume = 26 cm^3), but also with a linear 3.2 cm extension cephalad along the posterior margin of the external sphincter. There is adjacent subcutaneous edema along the perineum. Vascular/Lymphatic: Aortoiliac atherosclerotic vascular disease. No pathologic adenopathy identified. Reproductive: Brachytherapy seed implants in the small prostate gland. Other: Chronic 7.1 by 5.9 by 9.2 cm (volume = 200 cm^3) simple cystic lesion along the right groin, substantially increased in size from 03/14/2015 where this measured 3.0 by 1.9 by 3.9 cm (volume = 12 cm^3). Possibilities include seroma, lymphangioma, or ganglion cyst. Musculoskeletal: Bridging spurring of the left sacroiliac joint. Subtle sclerosis in the left iliac bone for example on image 62 series 2, increased from prior. Degenerative endplate sclerosis at the L3-4 level. IMPRESSION: 1. 26 cubic cm left perianal abscess with intersphincteric and subcutaneous components. There is also edema tracking along the perineum, and very careful clinical surveillance to exclude the possibility of a progressive fasciitis/Fournier's gangrene is recommended. 2. Substantial enlargement in the simple appearing cystic lesion along the right groin region, currently 200 cubic cm and previously 12 cubic cm. This could represent a seroma, lymphangioma, or ganglion cyst. 3. Subtle sclerosis in the left iliac bone compared to the right. Although the findings are subtle, strictly speaking I cannot exclude prostate metastatic lesion, and correlation with PSA level and/or bone scan would be suggested. 4. Other imaging findings of potential clinical significance: Mild cardiomegaly. Coronary atherosclerosis. Probable hepatic steatosis.  Chronic calcific pancreatitis. Renal sinus lipomatosis. Aortic Atherosclerosis (ICD10-I70.0). Seed implants in the prostate gland. Electronically Signed   By: Van Clines M.D.   On: 11/26/2020 20:33   DG Chest Port 1 View  Result Date: 11/26/2020 CLINICAL DATA:  Fever for 4 days, sepsis EXAM: PORTABLE CHEST 1 VIEW COMPARISON:  10/06/2017 FINDINGS: 2 frontal views of the chest demonstrate an unremarkable cardiac silhouette. No acute airspace disease, effusion, or pneumothorax. No acute bony abnormalities. IMPRESSION: 1. No acute intrathoracic process. Electronically Signed   By: Randa Ngo M.D.   On: 11/26/2020 18:45       Assessment & Plan:    Active Problems:   Peri-rectal abscess   1. Sepsis secondary to perirectal abscess 1. With temperature 102, tachycardia, tachypnea, CT evidence of perirectal abscess 2. AKI 3. UA is without signs of infection, chest x-ray is without signs of infection 4. Blood cultures and urine culture pending 5. General surgery with plan to I&D in the a.m. 6. Continue Zosyn 7. Patient received 4 L bolus in the ED 8. Blood pressure was still soft, maintenance fluids started 9. Continue to monitor 2. Perirectal abscess 1. GEN surge consulted and plans to I&D in the a.m. 3. Hypokalemia 1. Replace and recheck 2. Check mag in the a.m. 4. Mild protein calorie malnutrition 1. Patient is currently n.p.o. for procedure tomorrow, consider adding protein shakes when patient is able to tolerate p.o. 5. AKI 1. Creatinine increased from 1.10-1.67 2. Most likely prerenal 3. Continue fluids 6. Diabetes mellitus type 2 1. Continue long-acting insulin and short acting sliding scale 2. DC home insulin pump    DVT Prophylaxis-   Heparin- SCDs   AM Labs Ordered, also please review Full Orders  Family Communication: Admission, patients condition and plan of care including  tests being ordered have been discussed with the patient and wife who indicate  understanding and agree with the plan and Code Status.  Code Status: Full  Admission status: Inpatient :The appropriate admission status for this patient is INPATIENT. Inpatient status is judged to be reasonable and necessary in order to provide the required intensity of service to ensure the patient's safety. The patient's presenting symptoms, physical exam findings, and initial radiographic and laboratory data in the context of their chronic comorbidities is felt to place them at high risk for further clinical deterioration. Furthermore, it is not anticipated that the patient will be medically stable for discharge from the hospital within 2 midnights of admission. The following factors support the admission status of inpatient.     The patient's presenting symptoms include back pain The worrisome physical exam findings include perirectal abscess, SIRS criteria The initial radiographic and laboratory data are worrisome because of leukocytosis and AKI  the chronic co-morbidities include diabetes mellitus type 2.       * I certify that at the point of admission it is my clinical judgment that the patient will require inpatient hospital care spanning beyond 2 midnights from the point of admission due to high intensity of service, high risk for further deterioration and high frequency of surveillance required.*  Time spent in minutes :[place]

## 2020-11-27 NOTE — Anesthesia Preprocedure Evaluation (Signed)
Anesthesia Evaluation  Patient identified by MRN, date of birth, ID band Patient awake    Reviewed: Allergy & Precautions, H&P , NPO status , Patient's Chart, lab work & pertinent test results, reviewed documented beta blocker date and time   Airway Mallampati: II  TM Distance: >3 FB Neck ROM: full    Dental no notable dental hx.    Pulmonary neg pulmonary ROS, former smoker,    Pulmonary exam normal breath sounds clear to auscultation       Cardiovascular Exercise Tolerance: Good hypertension, negative cardio ROS   Rhythm:regular Rate:Normal     Neuro/Psych PSYCHIATRIC DISORDERS Anxiety Depression  Neuromuscular disease    GI/Hepatic Neg liver ROS, GERD  Medicated,  Endo/Other  negative endocrine ROSdiabetes, Insulin Dependent  Renal/GU negative Renal ROS  negative genitourinary   Musculoskeletal   Abdominal   Peds  Hematology negative hematology ROS (+)   Anesthesia Other Findings   Reproductive/Obstetrics negative OB ROS                             Anesthesia Physical Anesthesia Plan  ASA: III and emergent  Anesthesia Plan: General   Post-op Pain Management:    Induction:   PONV Risk Score and Plan: Ondansetron  Airway Management Planned:   Additional Equipment:   Intra-op Plan:   Post-operative Plan:   Informed Consent: I have reviewed the patients History and Physical, chart, labs and discussed the procedure including the risks, benefits and alternatives for the proposed anesthesia with the patient or authorized representative who has indicated his/her understanding and acceptance.     Dental Advisory Given  Plan Discussed with: CRNA  Anesthesia Plan Comments:         Anesthesia Quick Evaluation

## 2020-11-27 NOTE — Consult Note (Incomplete)
[facility] Consult  Reason for Consult:*** Referring Physician: ***    HPI: Anthony HooverJames Kamphaus is a 75 y.o. male with ***.  The *** started *** and has had a duration of ***.  It is associated with ***.  The *** is improved with ***, and is made worse with ***.   Past Medical History:  Diagnosis Date  . Agent orange exposure   . Anxiety   . At risk for sleep apnea    STOP BANG SCORE= 5             SENT TO PCP 11-24-2017  . Chronic calcific pancreatitis (HCC) 2000  . Colon cancer [facility](HCC)    09/ 2016  sigmoid cancer  s/p  left hemicolectomy (negative nodes and margins)  . Depression   . GERD (gastroesophageal reflux disease)   . Hemorrhoids   . History of adenomatous polyp of colon   . Hyperlipidemia   . Hyperplasia of prostate with lower urinary tract symptoms (LUTS)   . Hypertension   . Lipoma   . Peripheral neuropathy   . Prostate cancer [facility](HCC) urologist-  dr Berneice Heinrichmanny/  oncologist-  dr Kathrynn Runningmanning   dx 08-02-2017-- Stage T1c, Gleason, 3+4, PSA 5.07, vol 38cc--  scheduled for radioactive seed implants 11-29-2017  . Type 2 diabetes mellitus with insulin therapy (HCC)    Pt reports he is Type 1 Diabetic-diagnosed at age 53/  followed by pcp  . Wears glasses     Past Surgical History:  Procedure Laterality Date  . COLONOSCOPY  last one 03-14-2015  . CYSTOSCOPY N/A 11/29/2017   Procedure: CYSTOSCOPY;  Surgeon: Sebastian AcheManny, Theodore, MD;  [facility] [place];  Service: Urology;  Laterality: N/A;  no seeds in bladder per Dr Berneice HeinrichManny  . EVALUATION UNDER ANESTHESIA WITH HEMORRHOIDECTOMY N/A 05/29/2015   Procedure: ANAL EXAM UNDER ANESTHESIA WITH  HEMORRHOIDOPEXY; RIGID SIGMOIDOSCOPY;  Surgeon: Romie LeveeAlicia Thomas, MD;  [facility] [place];  Service: General;  Laterality: N/A;  . LAPAROSCOPIC PARTIAL COLECTOMY N/A 03/29/2015   Procedure: LAPAROSCOPIC SIGMOIDECTOMY;  Surgeon: Romie LeveeAlicia Thomas, MD;  Location: [facility];  Service: General;  Laterality: N/A;  . NASAL SEPTUM  SURGERY  2006  . PROSTATE BIOPSY  08-02-2017  dr Berneice Heinrichmanny office  . RADIOACTIVE SEED IMPLANT N/A 11/29/2017   Procedure: RADIOACTIVE SEED IMPLANT/BRACHYTHERAPY IMPLANT;  Surgeon: Sebastian AcheManny, Theodore, MD;  [facility] [place];  Service: Urology;  Laterality: N/A;  . SPACE OAR INSTILLATION N/A 11/29/2017   Procedure: SPACE OAR INSTILLATION;  Surgeon: Sebastian AcheManny, Theodore, MD;  [facility] [place];  Service: Urology;  Laterality: N/A;    Family History  Problem Relation Age of Onset  . Hyperlipidemia Father   . Stroke Father   . Prostate cancer Father   . Heart attack Father   . Diabetes Maternal Uncle   . Cancer Brother        unknown blood cancer  . Healthy Mother     Social History   Tobacco Use  . Smoking status: Former Smoker    Years: 30.00    Types: Cigarettes    Quit date: 06/26/2009    Years since quitting: 11.4  . Smokeless tobacco: Never Used  Vaping Use  . Vaping Use: Never used  Substance Use Topics  . Alcohol use: Not Currently  . Drug use: No    Medications: {medication reviewed/display:3041432}  Allergies  Allergen Reactions  . Atorvastatin Other (See Comments)    Reports elevated liver enzymes.  . Other Other (See  Comments)    Positive allergy test for peanuts and almonds  . Shellfish Allergy Other (See Comments)    Positive allergy test  . Toradol [Ketorolac Tromethamine] Other (See Comments)    Pt has chronic calcific pancreatitis  . Adhesive [Tape] Rash    Tegaderm      ROS:  {Review of Systems:30496}  Blood pressure 110/69, pulse 91, temperature 100.3 F (37.9 C), temperature source Axillary, resp. rate 18, height 6' (1.829 m), weight 90.7 kg, SpO2 96 %. Physical Exam  Results: Results for orders placed or performed during the hospital encounter of 11/26/20 (from the past 48 hour(s))  CBG monitoring, ED     Status: Abnormal   Collection Time: 11/26/20  4:07 PM  Result Value Ref Range   Glucose-Capillary 199 (H) 70 -  99 mg/dL    Comment: Glucose reference range applies only to samples taken after fasting for at least 8 hours.  Urinalysis, Routine w reflex microscopic     Status: Abnormal   Collection Time: 11/26/20  5:48 PM  Result Value Ref Range   Color, Urine YELLOW YELLOW   APPearance CLEAR CLEAR   Specific Gravity, Urine 1.019 1.005 - 1.030   pH 5.0 5.0 - 8.0   Glucose, UA NEGATIVE NEGATIVE mg/dL   Hgb urine dipstick MODERATE (A) NEGATIVE   Bilirubin Urine NEGATIVE NEGATIVE   Ketones, ur 20 (A) NEGATIVE mg/dL   Protein, ur 30 (A) NEGATIVE mg/dL   Nitrite NEGATIVE NEGATIVE   Leukocytes,Ua NEGATIVE NEGATIVE   RBC / HPF 0-5 0 - 5 RBC/hpf   WBC, UA 0-5 0 - 5 WBC/hpf   Bacteria, UA NONE SEEN NONE SEEN   Mucus PRESENT    Amorphous Crystal PRESENT     Comment: Performed at [facility], [address] [place], [place] [address]  Resp Panel by RT-PCR (Flu A&B, Covid)     Status: None   Collection Time: 11/26/20  5:55 PM   Specimen: Nasopharyngeal(NP) swabs in vial transport medium  Result Value Ref Range   SARS Coronavirus 2 by RT PCR NEGATIVE NEGATIVE    Comment: (NOTE) SARS-CoV-2 target nucleic acids are NOT DETECTED.  The SARS-CoV-2 RNA is generally detectable in upper respiratory specimens during the acute phase of infection. The lowest concentration of SARS-CoV-2 viral copies this assay can detect is 138 copies/mL. A negative result does not preclude SARS-Cov-2 infection and should not be used as the sole basis for treatment or other patient management decisions. A negative result may occur with  improper specimen collection/handling, submission of specimen other than nasopharyngeal swab, presence of viral mutation(s) within the areas targeted by this assay, and inadequate number of viral copies(<138 copies/mL). A negative result must be combined with clinical observations, patient history, and epidemiological information. The expected result is Negative.  Fact Sheet for Patients:   EntrepreneurPulse.com.au  Fact Sheet for Healthcare Providers:  IncredibleEmployment.be  This test is no t yet approved or cleared by the [place] FDA and  has been authorized for detection and/or diagnosis of SARS-CoV-2 by FDA under an Emergency Use Authorization (EUA). This EUA will remain  in effect (meaning this test can be used) for the duration of the COVID-19 declaration under Section 564(b)(1) of the Act, 21 U.S.C.section 360bbb-3(b)(1), unless the authorization is terminated  or revoked sooner.       Influenza A by PCR NEGATIVE NEGATIVE   Influenza B by PCR NEGATIVE NEGATIVE    Comment: (NOTE) The Xpert Xpress SARS-CoV-2/FLU/RSV plus assay is intended as  an aid in the diagnosis of influenza from Nasopharyngeal swab specimens and should not be used as a sole basis for treatment. Nasal washings and aspirates are unacceptable for Xpert Xpress SARS-CoV-2/FLU/RSV testing.  Fact Sheet for Patients: EntrepreneurPulse.com.au  Fact Sheet for Healthcare Providers: IncredibleEmployment.be  This test is not yet approved or cleared by the [place] FDA and has been authorized for detection and/or diagnosis of SARS-CoV-2 by FDA under an Emergency Use Authorization (EUA). This EUA will remain in effect (meaning this test can be used) for the duration of the COVID-19 declaration under Section 564(b)(1) of the Act, 21 U.S.C. section 360bbb-3(b)(1), unless the authorization is terminated or revoked.  Performed at [facility], [address], [place], [place] [address]   Lactic acid, plasma     Status: Abnormal   Collection Time: 11/26/20  6:14 PM  Result Value Ref Range   Lactic Acid, Venous 2.0 (HH) 0.5 - 1.9 mmol/L    Comment: CRITICAL RESULT CALLED TO, READ BACK BY AND VERIFIED WITH: THOMPSON,R ON 11/26/20 AT 1930 BY LOY,C Performed at [facility], [address] [place], [place] [address]    Comprehensive metabolic panel     Status: Abnormal   Collection Time: 11/26/20  6:14 PM  Result Value Ref Range   Sodium 133 (L) 135 - 145 mmol/L   Potassium 2.8 (L) 3.5 - 5.1 mmol/L   Chloride 95 (L) 98 - 111 mmol/L   CO2 25 22 - 32 mmol/L   Glucose, Bld 185 (H) 70 - 99 mg/dL    Comment: Glucose reference range applies only to samples taken after fasting for at least 8 hours.   BUN 31 (H) 8 - 23 mg/dL   Creatinine, Ser 1.67 (H) 0.61 - 1.24 mg/dL   Calcium 8.6 (L) 8.9 - 10.3 mg/dL   Total Protein 7.0 6.5 - 8.1 g/dL   Albumin 2.9 (L) 3.5 - 5.0 g/dL   AST 74 (H) 15 - 41 U/L   ALT 35 0 - 44 U/L   Alkaline Phosphatase 84 38 - 126 U/L   Total Bilirubin 1.2 0.3 - 1.2 mg/dL   GFR, Estimated 43 (L) >60 mL/min    Comment: (NOTE) Calculated using the CKD-EPI Creatinine Equation (2021)    Anion gap 13 5 - 15    Comment: Performed at [facility], [address] [place], [place] [address]  CBC WITH DIFFERENTIAL     Status: Abnormal   Collection Time: 11/26/20  6:14 PM  Result Value Ref Range   WBC 14.6 (H) 4.0 - 10.5 K/uL   RBC 5.47 4.22 - 5.81 MIL/uL   Hemoglobin 16.4 13.0 - 17.0 g/dL   HCT 49.0 39.0 - 52.0 %   MCV 89.6 80.0 - 100.0 fL   MCH 30.0 26.0 - 34.0 pg   MCHC 33.5 30.0 - 36.0 g/dL   RDW 13.5 11.5 - 15.5 %   Platelets 200 150 - 400 K/uL   nRBC 0.0 0.0 - 0.2 %   Neutrophils Relative % 81 %   Neutro Abs 11.8 (H) 1.7 - 7.7 K/uL   Lymphocytes Relative 7 %   Lymphs Abs 1.0 0.7 - 4.0 K/uL   Monocytes Relative 11 %   Monocytes Absolute 1.7 (H) 0.1 - 1.0 K/uL   Eosinophils Relative 0 %   Eosinophils Absolute 0.0 0.0 - 0.5 K/uL   Basophils Relative 0 %   Basophils Absolute 0.0 0.0 - 0.1 K/uL   Immature Granulocytes 1 %   Abs Immature Granulocytes  0.12 (H) 0.00 - 0.07 K/uL    Comment: Performed at [facility], [address], [place], [place] [address]  Protime-INR     Status: None   Collection Time: 11/26/20  6:14 PM  Result Value Ref Range   Prothrombin Time 14.3 11.4 -  15.2 seconds   INR 1.1 0.8 - 1.2    Comment: (NOTE) INR goal varies based on device and disease states. Performed at [facility], [address] [place], [place] [address]   APTT     Status: None   Collection Time: 11/26/20  6:14 PM  Result Value Ref Range   aPTT 25 24 - 36 seconds    Comment: Performed at [facility], [address] [place], [place] [address]  Blood culture (routine single)     Status: None (Preliminary result)   Collection Time: 11/26/20  6:14 PM   Specimen: BLOOD  Result Value Ref Range   Specimen Description BLOOD RIGHT ANTECUBITAL    Special Requests      BOTTLES DRAWN AEROBIC AND ANAEROBIC Blood Culture adequate volume   Culture      NO GROWTH < 12 HOURS Performed at [facility], [address] [place], [place] [address]    Report Status PENDING   Magnesium     Status: None   Collection Time: 11/26/20 10:25 PM  Result Value Ref Range   Magnesium 2.1 1.7 - 2.4 mg/dL    Comment: Performed at [facility], [address], [place], [place] [address]  Lactic acid, plasma     Status: None   Collection Time: 11/26/20 11:03 PM  Result Value Ref Range   Lactic Acid, Venous 1.5 0.5 - 1.9 mmol/L    Comment: Performed at [facility], [address], [place], [place] [address]  CBG monitoring, ED     Status: Abnormal   Collection Time: 11/26/20 11:04 PM  Result Value Ref Range   Glucose-Capillary 229 (H) 70 - 99 mg/dL    Comment: Glucose reference range applies only to samples taken after fasting for at least 8 hours.  Glucose, capillary     Status: Abnormal   Collection Time: 11/27/20  1:39 AM  Result Value Ref Range   Glucose-Capillary 229 (H) 70 - 99 mg/dL    Comment: Glucose reference range applies only to samples taken after fasting for at least 8 hours.  Magnesium     Status: None   Collection Time: 11/27/20  3:53 AM  Result Value Ref Range   Magnesium 1.8 1.7 - 2.4 mg/dL    Comment: Performed at [facility], [address] [place], [place] [address]   CBC     Status: Abnormal   Collection Time: 11/27/20  3:53 AM  Result Value Ref Range   WBC 17.8 (H) 4.0 - 10.5 K/uL   RBC 4.81 4.22 - 5.81 MIL/uL   Hemoglobin 14.5 13.0 - 17.0 g/dL   HCT 43.6 39.0 - 52.0 %   MCV 90.6 80.0 - 100.0 fL   MCH 30.1 26.0 - 34.0 pg   MCHC 33.3 30.0 - 36.0 g/dL   RDW 13.4 11.5 - 15.5 %   Platelets 189 150 - 400 K/uL   nRBC 0.0 0.0 - 0.2 %    Comment: Performed at [facility], [address], [place], [place] [address]  Comprehensive metabolic panel     Status: Abnormal   Collection Time: 11/27/20  3:53 AM  Result Value Ref Range   Sodium 133 (L) 135 - 145 mmol/L  Potassium 3.3 (L) 3.5 - 5.1 mmol/L   Chloride 99 98 - 111 mmol/L   CO2 21 (L) 22 - 32 mmol/L   Glucose, Bld 240 (H) 70 - 99 mg/dL    Comment: Glucose reference range applies only to samples taken after fasting for at least 8 hours.   BUN 21 8 - 23 mg/dL   Creatinine, Ser 1.34 (H) 0.61 - 1.24 mg/dL   Calcium 8.1 (L) 8.9 - 10.3 mg/dL   Total Protein 5.4 (L) 6.5 - 8.1 g/dL   Albumin 2.2 (L) 3.5 - 5.0 g/dL   AST 60 (H) 15 - 41 U/L   ALT 32 0 - 44 U/L   Alkaline Phosphatase 68 38 - 126 U/L   Total Bilirubin 1.6 (H) 0.3 - 1.2 mg/dL   GFR, Estimated 56 (L) >60 mL/min    Comment: (NOTE) Calculated using the CKD-EPI Creatinine Equation (2021)    Anion gap 13 5 - 15    Comment: Performed at [facility], [address] [place], [place] [address]  Glucose, capillary     Status: Abnormal   Collection Time: 11/27/20  8:17 AM  Result Value Ref Range   Glucose-Capillary 217 (H) 70 - 99 mg/dL    Comment: Glucose reference range applies only to samples taken after fasting for at least 8 hours.    CT ABDOMEN PELVIS W CONTRAST  Result Date: 11/26/2020 CLINICAL DATA:  Abdominal abscess along the peroneal/rectal region. Chronic pancreatitis. History of prostate and colon cancer EXAM: CT ABDOMEN AND PELVIS WITH CONTRAST TECHNIQUE: Multidetector CT imaging of the abdomen and pelvis was performed using the  standard protocol following bolus administration of intravenous contrast. CONTRAST:  81mL OMNIPAQUE IOHEXOL 300 MG/ML  SOLN COMPARISON:  MRI pelvis 02/26/2020 and CT abdomen/pelvis 03/14/2015 FINDINGS: Lower chest: Gynecomastia. Mild cardiomegaly. Right coronary artery atherosclerotic vascular calcification. Dependent subsegmental atelectasis in the posterior basal segments of both lower lobes. Hepatobiliary: Probable hepatic steatosis. Mildly contracted gallbladder. Streak artifact from electronic device posterior to the patient partially obscures the upper abdomen. Pancreas: Coarse calcifications in the pancreatic parenchyma compatible with chronic calcific pancreatitis. Spleen: Unremarkable Adrenals/Urinary Tract: Mild bilateral renal sinus lipomatosis. Urinary bladder unremarkable. No urinary tract calculi are identified. The adrenal glands appear normal. 0.6 cm left kidney upper pole hypodense lesion is likely a cyst although technically too small to characterize, unchanged from 2016. Stomach/Bowel: Anastomotic staple line in the distal sigmoid colon. Normal appendix. Left eccentric intersphincteric perianal abscess along the left lower buttock likely extending into the subcutaneous tissues, measuring about 4.1 by 2.7 by 4.4 cm (volume = 26 cm^3), but also with a linear 3.2 cm extension cephalad along the posterior margin of the external sphincter. There is adjacent subcutaneous edema along the perineum. Vascular/Lymphatic: Aortoiliac atherosclerotic vascular disease. No pathologic adenopathy identified. Reproductive: Brachytherapy seed implants in the small prostate gland. Other: Chronic 7.1 by 5.9 by 9.2 cm (volume = 200 cm^3) simple cystic lesion along the right groin, substantially increased in size from 03/14/2015 where this measured 3.0 by 1.9 by 3.9 cm (volume = 12 cm^3). Possibilities include seroma, lymphangioma, or ganglion cyst. Musculoskeletal: Bridging spurring of the left sacroiliac joint. Subtle  sclerosis in the left iliac bone for example on image 62 series 2, increased from prior. Degenerative endplate sclerosis at the L3-4 level. IMPRESSION: 1. 26 cubic cm left perianal abscess with intersphincteric and subcutaneous components. There is also edema tracking along the perineum, and very careful clinical surveillance to exclude the possibility of a progressive fasciitis/Fournier's  gangrene is recommended. 2. Substantial enlargement in the simple appearing cystic lesion along the right groin region, currently 200 cubic cm and previously 12 cubic cm. This could represent a seroma, lymphangioma, or ganglion cyst. 3. Subtle sclerosis in the left iliac bone compared to the right. Although the findings are subtle, strictly speaking I cannot exclude prostate metastatic lesion, and correlation with PSA level and/or bone scan would be suggested. 4. Other imaging findings of potential clinical significance: Mild cardiomegaly. Coronary atherosclerosis. Probable hepatic steatosis. Chronic calcific pancreatitis. Renal sinus lipomatosis. Aortic Atherosclerosis (ICD10-I70.0). Seed implants in the prostate gland. Electronically Signed   By: Van Clines M.D.   On: 11/26/2020 20:33   DG Chest Port 1 View  Result Date: 11/26/2020 CLINICAL DATA:  Fever for 4 days, sepsis EXAM: PORTABLE CHEST 1 VIEW COMPARISON:  10/06/2017 FINDINGS: 2 frontal views of the chest demonstrate an unremarkable cardiac silhouette. No acute airspace disease, effusion, or pneumothorax. No acute bony abnormalities. IMPRESSION: 1. No acute intrathoracic process. Electronically Signed   By: Randa Ngo M.D.   On: 11/26/2020 18:45     Assessment & Plan:  Soua Ibarra is a 75 y.o. male with *** -*** -*** -Follow up ***  All questions were answered to the satisfaction of the patient and family***.  The risk and benefits of *** were discussed including but not limited to ***.  After careful consideration, Tyquon Disotell has decided to ***.     Virl Cagey 11/27/2020, 9:01 AM

## 2020-11-27 NOTE — Anesthesia Postprocedure Evaluation (Signed)
Anesthesia Post Note  Patient: Anthony [name]  Procedure(s) Performed: IRRIGATION AND DEBRIDEMENT PERIRECTAL ABSCESS with drain placement and seton placement (N/A Anus)  Patient location during evaluation: PACU Anesthesia Type: General Level of consciousness: awake Pain management: pain level controlled Vital Signs Assessment: post-procedure vital signs reviewed and stable Respiratory status: spontaneous breathing and respiratory function stable Cardiovascular status: blood pressure returned to baseline and stable Postop Assessment: no headache and no apparent nausea or vomiting Anesthetic complications: no Comments: MAP stable, IV fluids given   No complications documented.   Last Vitals:  Vitals:   11/27/20 1345 11/27/20 1402  BP: (!) 88/68 94/64  Pulse: 87 85  Resp: 19 16  Temp: 37 C 37.1 C  SpO2: 97% 95%    Last Pain:  Vitals:   11/27/20 1402  TempSrc: Oral  PainSc:                  Louann Sjogren

## 2020-11-27 NOTE — Progress Notes (Addendum)
[facility]  Updated wife about abscess drainage with drain placement and fistula with seton placement. Would continue antibiotics. Cultures sent. Sitz baths. Diet ordered and PRN pain meds.   Updated Hospitalist and RN.   Curlene Labrum, MD [facility] [address] [place], [place] [address] [phone] (office)

## 2020-11-27 NOTE — Progress Notes (Signed)
Initial Nutrition Assessment  DOCUMENTATION CODES:      INTERVENTION:  Diabetes and Nutrition    Protein Content in Foods- Handouts attached with discharge instructions  NUTRITION DIAGNOSIS:   Increased nutrient needs related to post-op healing (Incision and drainage of perirectal abcess) as evidenced by estimated needs.   GOAL:  Patient will meet greater than or equal to 90% of their needs  MONITOR:  PO intake,Supplement acceptance,Labs,Skin,Weight trends  REASON FOR ASSESSMENT:   Consult Assessment of nutrition requirement/status,Diet education,Wound healing,Poor PO  ASSESSMENT: Patient is a 75 yo DM2 (A1C-pending), GERD, HTN, Prostate cancer, HLD, Chronic pancreatitis. Presents with sepsis-peri rectal abscess. CT - abdomen /pelvis and surgery consulted- plans for (I&D) this morning.   Patient is NPO. RD multiple attempts to see patient today. Late this afternoon he has returned from surgery and is sleeping soundly. Spouse at bedside. Talked with her about his recent intake. He usually skips breakfast and eats very little protein foods. Prefers chips, pretzels, popcorn and sweets- primarily empty calories. Emphasized the importance of protein and nutrition foods with each meal to support healing.  Medications: insulin, Levimir  Weight loss of 9 kg the past 7 months - wife reports a desirable per pt physician recommendation.  IVF- NS @ 100 ml/hr  Labs: 5/4 A1C- 7.6%  BMP Latest Ref Rng & Units 11/27/2020 11/26/2020 11/29/2017  Glucose 70 - 99 mg/dL 240(H) 185(H) 142(H)  BUN 8 - 23 mg/dL 21 31(H) 9  Creatinine 0.61 - 1.24 mg/dL 1.34(H) 1.67(H) 1.10  Sodium 135 - 145 mmol/L 133(L) 133(L) 142  Potassium 3.5 - 5.1 mmol/L 3.3(L) 2.8(L) 3.4(L)  Chloride 98 - 111 mmol/L 99 95(L) 104  CO2 22 - 32 mmol/L 21(L) 25 -  Calcium 8.9 - 10.3 mg/dL 8.1(L) 8.6(L) -     NUTRITION - FOCUSED PHYSICAL EXAM: Unable to complete Nutrition-Focused physical exam at this time.    Diet Order:    Diet Order            Diet heart healthy/carb modified Room service appropriate? Yes; Fluid consistency: Thin  Diet effective now                 EDUCATION NEEDS:  Education needs have been addressed (with spouse and attached handouts)  Skin:  Skin Assessment: Skin Integrity Issues: Skin Integrity Issues:: Incisions Incisions: rectum - penrose drain placement X 2, seton placement to fistula track  Last BM:  5/4 type 7  Height:   Ht Readings from Last 1 Encounters:  11/26/20 6' (1.829 m)    Weight:   Wt Readings from Last 1 Encounters:  11/26/20 90.7 kg    Ideal Body Weight:   81 kg  BMI:  Body mass index is 27.12 kg/m.  Estimated Nutritional Needs:   Kcal:  2200-2400  Protein:  113-130 gr  Fluid:  2.2-2.4 liters daily   Colman Cater MS,RD,CSG,LDN Contact: [email or web address]

## 2020-11-27 NOTE — Progress Notes (Signed)
Dr.Kiel aware of patients hypotension but MAP of 73, said to increase patients fluids and that patient was fine to return to the floor.

## 2020-11-27 NOTE — Transfer of Care (Signed)
Immediate Anesthesia Transfer of Care Note  Patient: Anthony [name]  Procedure(s) Performed: IRRIGATION AND DEBRIDEMENT PERIRECTAL ABSCESS with drain placement and seton placement (N/A Anus)  Patient Location: PACU  Anesthesia Type:General  Level of Consciousness: awake  Airway & Oxygen Therapy: Patient Spontanous Breathing and Patient connected to face mask oxygen  Post-op Assessment: Report given to RN and Post -op Vital signs reviewed and stable  Post vital signs: Reviewed and stable  Last Vitals:  Vitals Value Taken Time  BP 71/55 11/27/20 1239  Temp    Pulse 102 11/27/20 1242  Resp 26 11/27/20 1242  SpO2 100 % 11/27/20 1242  Vitals shown include unvalidated device data.  Last Pain:  Vitals:   11/27/20 1054  TempSrc: Oral  PainSc: 5       Patients Stated Pain Goal: 5 (85/02/77 4128)  Complications: No complications documented.

## 2020-11-27 NOTE — ED Notes (Addendum)
This RN was told pt can be sent to the floor once 3 cycles of BP were above 623 systolic per hospitalit. Pt achieved this goals and is able to be transferred at this time.

## 2020-11-28 ENCOUNTER — Encounter (HOSPITAL_COMMUNITY): Payer: Self-pay | Admitting: General Surgery

## 2020-11-28 DIAGNOSIS — K529 Noninfective gastroenteritis and colitis, unspecified: Secondary | ICD-10-CM

## 2020-11-28 DIAGNOSIS — E785 Hyperlipidemia, unspecified: Secondary | ICD-10-CM

## 2020-11-28 LAB — COMPREHENSIVE METABOLIC PANEL
ALT: 30 U/L (ref 0–44)
AST: 57 U/L — ABNORMAL HIGH (ref 15–41)
Albumin: 1.9 g/dL — ABNORMAL LOW (ref 3.5–5.0)
Alkaline Phosphatase: 71 U/L (ref 38–126)
Anion gap: 9 (ref 5–15)
BUN: 20 mg/dL (ref 8–23)
CO2: 22 mmol/L (ref 22–32)
Calcium: 7.4 mg/dL — ABNORMAL LOW (ref 8.9–10.3)
Chloride: 109 mmol/L (ref 98–111)
Creatinine, Ser: 1.48 mg/dL — ABNORMAL HIGH (ref 0.61–1.24)
GFR, Estimated: 50 mL/min — ABNORMAL LOW (ref >60)
Glucose, Bld: 121 mg/dL — ABNORMAL HIGH (ref 70–99)
Potassium: 2.7 mmol/L — CL (ref 3.5–5.1)
Sodium: 140 mmol/L (ref 135–145)
Total Bilirubin: 1.2 mg/dL (ref 0.3–1.2)
Total Protein: 4.8 g/dL — ABNORMAL LOW (ref 6.5–8.1)

## 2020-11-28 LAB — GLUCOSE, CAPILLARY
Glucose-Capillary: 127 mg/dL — ABNORMAL HIGH (ref 70–99)
Glucose-Capillary: 137 mg/dL — ABNORMAL HIGH (ref 70–99)
Glucose-Capillary: 335 mg/dL — ABNORMAL HIGH (ref 70–99)
Glucose-Capillary: 336 mg/dL — ABNORMAL HIGH (ref 70–99)

## 2020-11-28 LAB — PHOSPHORUS: Phosphorus: 2.5 mg/dL (ref 2.5–4.6)

## 2020-11-28 LAB — CBC WITH DIFFERENTIAL/PLATELET
Abs Immature Granulocytes: 0.23 10*3/uL — ABNORMAL HIGH (ref 0.00–0.07)
Basophils Absolute: 0.1 10*3/uL (ref 0.0–0.1)
Basophils Relative: 0 %
Eosinophils Absolute: 0.1 10*3/uL (ref 0.0–0.5)
Eosinophils Relative: 1 %
HCT: 38.2 % — ABNORMAL LOW (ref 39.0–52.0)
Hemoglobin: 12.5 g/dL — ABNORMAL LOW (ref 13.0–17.0)
Immature Granulocytes: 1 %
Lymphocytes Relative: 8 %
Lymphs Abs: 1.3 10*3/uL (ref 0.7–4.0)
MCH: 30 pg (ref 26.0–34.0)
MCHC: 32.7 g/dL (ref 30.0–36.0)
MCV: 91.8 fL (ref 80.0–100.0)
Monocytes Absolute: 1.5 10*3/uL — ABNORMAL HIGH (ref 0.1–1.0)
Monocytes Relative: 10 %
Neutro Abs: 12.7 10*3/uL — ABNORMAL HIGH (ref 1.7–7.7)
Neutrophils Relative %: 80 %
Platelets: 188 10*3/uL (ref 150–400)
RBC: 4.16 MIL/uL — ABNORMAL LOW (ref 4.22–5.81)
RDW: 13.7 % (ref 11.5–15.5)
WBC: 16 10*3/uL — ABNORMAL HIGH (ref 4.0–10.5)
nRBC: 0 % (ref 0.0–0.2)

## 2020-11-28 LAB — MAGNESIUM: Magnesium: 1.8 mg/dL (ref 1.7–2.4)

## 2020-11-28 LAB — URINE CULTURE: Culture: NO GROWTH

## 2020-11-28 LAB — PSA: Prostatic Specific Antigen: 0.12 ng/mL (ref 0.00–4.00)

## 2020-11-28 LAB — HEMOGLOBIN A1C
Hgb A1c MFr Bld: 7.5 % — ABNORMAL HIGH (ref 4.8–5.6)
Mean Plasma Glucose: 168.55 mg/dL

## 2020-11-28 MED ORDER — SODIUM CHLORIDE 0.9 % IV BOLUS
500.0000 mL | Freq: Once | INTRAVENOUS | Status: AC
  Administered 2020-11-28: 500 mL via INTRAVENOUS

## 2020-11-28 MED ORDER — INSULIN ASPART 100 UNIT/ML IJ SOLN
0.0000 [IU] | Freq: Three times a day (TID) | INTRAMUSCULAR | Status: DC
  Administered 2020-11-28: 7 [IU] via SUBCUTANEOUS
  Administered 2020-11-29 (×3): 2 [IU] via SUBCUTANEOUS
  Administered 2020-11-30: 5 [IU] via SUBCUTANEOUS
  Administered 2020-11-30: 1 [IU] via SUBCUTANEOUS

## 2020-11-28 MED ORDER — INSULIN DETEMIR 100 UNIT/ML ~~LOC~~ SOLN
25.0000 [IU] | Freq: Every day | SUBCUTANEOUS | Status: DC
  Administered 2020-11-28 – 2020-11-29 (×2): 25 [IU] via SUBCUTANEOUS
  Filled 2020-11-28 (×4): qty 0.25

## 2020-11-28 MED ORDER — MAGNESIUM SULFATE 2 GM/50ML IV SOLN
2.0000 g | Freq: Once | INTRAVENOUS | Status: AC
  Administered 2020-11-28: 2 g via INTRAVENOUS
  Filled 2020-11-28: qty 50

## 2020-11-28 MED ORDER — POTASSIUM CHLORIDE CRYS ER 20 MEQ PO TBCR
40.0000 meq | EXTENDED_RELEASE_TABLET | Freq: Two times a day (BID) | ORAL | Status: DC
  Administered 2020-11-28: 40 meq via ORAL
  Filled 2020-11-28 (×2): qty 2

## 2020-11-28 MED ORDER — PANCRELIPASE (LIP-PROT-AMYL) 12000-38000 UNITS PO CPEP
36000.0000 [IU] | ORAL_CAPSULE | Freq: Three times a day (TID) | ORAL | Status: DC
  Administered 2020-11-29 – 2020-11-30 (×5): 36000 [IU] via ORAL
  Filled 2020-11-28 (×5): qty 3

## 2020-11-28 MED ORDER — POTASSIUM CHLORIDE 10 MEQ/100ML IV SOLN
INTRAVENOUS | Status: AC
  Filled 2020-11-28: qty 100

## 2020-11-28 MED ORDER — INSULIN ASPART 100 UNIT/ML IJ SOLN
0.0000 [IU] | Freq: Every day | INTRAMUSCULAR | Status: DC
  Administered 2020-11-28: 4 [IU] via SUBCUTANEOUS
  Administered 2020-11-29: 2 [IU] via SUBCUTANEOUS

## 2020-11-28 MED ORDER — POTASSIUM CHLORIDE 10 MEQ/100ML IV SOLN
10.0000 meq | INTRAVENOUS | Status: AC
  Administered 2020-11-28 (×6): 10 meq via INTRAVENOUS
  Filled 2020-11-28 (×3): qty 100

## 2020-11-28 NOTE — Progress Notes (Signed)
PROGRESS NOTE    Anthony [name]  HUT:[id number] DOB: 13-Jan-1946 DOA: 11/26/2020 PCP: Merrilee Seashore, MD   Brief Narrative:  The patient is a 75 yo overweigh AAM with a PMH significant for but not limited too T2DM, Prostate Cancer, HTN, HLD, GERD, Chronic Calcified Pancreatitis as well as other comorbidities who presented with CC of back pain.  Patient has had chronic back pain but that has and has not thought much of it but reportedly 2 days ago noticed a bump in his perineum.  Reports the bump in his perineum gradually became larger and more painful.  He describes a pressure and describes the pain as sharp and constant last 2 days and states is progressively gotten worse.  He states it has not affected his bowel movements but he does have fecal incontinence approximately 25 daily and has been having this for years.  He has not noticed any drainage or bleeding from his bowel.  He also reports that he has a stoma that is "filling back up".  States that this is been aspirated twice with both UA 200 mL at a time.  In the ED he is found to be septic with a temperature of 102.2, respirations were 29 and heart rate of 103.  Leukocytosis was 14.6.  He was admitted for severe sepsis in the setting of 26 cubic cm of perineal abscess with intersphinteric and subacute components with possibility of progressive fasciitis.  General surgery was consulted and patient was taken for incision and drainage of his perineal abscess with Penrose drain placement x2 and seton placement to the fistula tract on 11/27/2020 and he is is postoperative day 1.  His intraoperative cultures are showing few gram-negative rods and few gram-positive cocci. General surgery recommends sitz bath's.  Now patient is hospitalization has been complicated by hypotension so his antihypertensives have been discontinued.  Assessment & Plan:   Principal Problem:   Peri-rectal abscess  Severe sepsis present on admission secondary to perirectal abscess  with concern for Fournier's gangrene -Patient met SIRS criteria on admission with a temperature of 102.7, pulse rate of 103, respiratory rate of 29, and a WBC of 14.6 which is now trended up to 17.8 yesterday and is now 16.0 -Patient's lactic acid level was 2.0 on admission does characterizing severe sepsis -General surgery has been consulted and plan to take the patient to the OR for incision and drainage -Patient received 4 L boluses of the ED of lactated Ringer's and is now on maintenance IV fluids with normal saline at 100 MLS per hour -Pressures remain on the softer side so we will hold his antihypertensives for now including his lisinopril and amlodipine -Urinalysis on admission showed moderate hemoglobin and 20 ketones but negative leukocytes, negative nitrites and no bacteria seen -Blood culture and urine culture still pending -CT scan of the abdomen pelvis done and showed "26 cubic cm left perianal abscess with intersphincteric and subcutaneous components. There is also edema tracking along the perineum, and very careful clinical surveillance to exclude the possibility of a progressive fasciitis/Fournier's gangrene is recommended. Substantial enlargement in the simple appearing cystic lesion along the right groin region, currently 200 cubic cm and previously 12 cubic cm. This could represent a seroma, lymphangioma, or ganglion cyst.  Subtle sclerosis in the left iliac bone compared to the right. Although the findings are subtle, strictly speaking I cannot exclude prostate metastatic lesion, and correlation with PSA level and/or bone scan would be suggested. 4. Other imaging findings of potential clinical significance:  Mild cardiomegaly. Coronary atherosclerosis. Probable hepatic steatosis. Chronic calcific pancreatitis. Renal sinus lipomatosis. Aortic Atherosclerosis (ICD10-I70.0). Seed implants in the prostate gland." -Continue IV antibiotic coverage with IV Zosyn for now every 8 hours -Continue  supportive care and continue with antiemetics with ondansetron 4 mg p.o./IV every 6 hours as needed nausea -We will also continue pain control with IV morphine 2 mg every 2 hours as needed severe pain, p.o. oxycodone IR 5 mg every 4 hours as needed for moderate pain as well as acetaminophen 650 mg p.o./RC every 6 as needed for mild pain or fever -General surgery Flushed Penrose and he did well overall. Surgery recommending RN flushing Penrose  Each shift  -Follow Cx  Hyponatremia -Mild and improved -Patient sodium was 133 and improved to 140 today; currently getting normal saline at 100 MLS per hour -Continue to monitor and trend and repeat CMP in a.m.  Hypokalemia -Patient's potassium today was 2.7 -Replete with IV KCl 60 mEQ and with po KCl 40 mEQ BID -Mag Level was 1.8 and will replete -Continue monitor and trend and replete as necessary -Repeat CMP in a.m.  AKI, improving  Metabolic acidosis -Presented with a BUNs/creatinine of 31/1.67; baseline creatinine of 1.10 -Likely was prerenal and Now BUN/Cr had improved to 21/1.34 -> 20/1.48 -Patient has a small metabolic acidosis with a CO2 of 21, anion gap of 13, chloride level of 99 -Currently getting normal saline at 100 MLS per hour -Avoid nephrotoxic medications, contrast dyes, hypotension renally dose medications -Blood pressure was on the softer side so we will hold his amlodipine and lisinopril for now -Repeat CMP in a.m.  Hyperbilirubinemia -Patient is to bili went from 1.2 -> 1.6 -> 1.2 -Likely reactive we will continue to monitor and trend -Repeat CMP in a.m.  Hyperlipidemia -Patient likely statin intolerant and continues Evolocumab SOSY 140 mg sq q14days  Perirectal Abscess -As above; General Surgery consulted and plans to take the patient for I&D this a.m.  Chronic Pancreatitis With Chronic Diarrhea -Start Creon  -Likely has Fecal Incontinence from Prior Surgeries -Continue to Monitor   Mild protein calorie  malnutrition -Patient is currently n.p.o. for procedure tomorrow, consider adding protein shakes when patient is able to tolerate p.o. -Will consult Nutritionist for further evaluation and recommendations -Dietitian is recommending increasing nutrient needs and given the patient protein content in foods  Diabetes Mellitus Type 2 -Continue long-acting insulin and short acting sliding scale -DC home insulin pump while hospitalized -Last hemoglobin A1c was documented in her system was 7.6 -Repeat hemoglobin A1c while patient is hospitalized here -Will consult Diabetes Education coordinator for further blood sugar assistance and management  -CBG's Ranging from 127-335  History of prostate cancer -Diagnosed in August 02, 2017 as stage I TC Gleason 3+4 -PSA at that time was 5.07 -He is status post radioactive seed implants on 11/29/2017 -We will check PSA again given findings on CT scan as above as they showed "Subtle sclerosis in the left iliac bone compared to the right. Although the findings are subtle, strictly speaking I cannot exclude prostate metastatic lesion, and correlation with PSA level and/or bone scan would be suggested." -PSA was stable at 0.12  DVT prophylaxis: Heparin 5,000 units sq Code Status: FULL CODE  Family Communication: Discussed with wife at bedside  Disposition Plan: Pending general surgery clearance as well as evaluation by PT OT  Status is: Inpatient  Remains inpatient appropriate because:Unsafe d/c plan, IV treatments appropriate due to intensity of illness or inability to take PO and  Inpatient level of care appropriate due to severity of illness   Dispo: The patient is from: Home              Anticipated d/c is to: TBD              Patient currently is not medically stable to d/c.   Difficult to place patient No  Consultants:   General Surgery  Procedures:  Performed by Dr. Curlene Labrum: Incision and drainage of perirectal abscess with penrose drain  placement X 2, seton placement to fistula track    Antimicrobials:  Anti-infectives (From admission, onward)   Start     Dose/Rate Route Frequency Ordered Stop   11/27/20 1100  cefoTEtan (CEFOTAN) 2 g in sodium chloride 0.9 % 100 mL IVPB        2 g 200 mL/hr over 30 Minutes Intravenous On call to O.R. 11/27/20 1007 11/27/20 1203   11/27/20 0400  piperacillin-tazobactam (ZOSYN) IVPB 3.375 g  Status:  Discontinued        3.375 g 100 mL/hr over 30 Minutes Intravenous Every 8 hours 11/26/20 2230 11/26/20 2252   11/27/20 0200  piperacillin-tazobactam (ZOSYN) IVPB 3.375 g        3.375 g 12.5 mL/hr over 240 Minutes Intravenous Every 8 hours 11/26/20 2252     11/26/20 1815  piperacillin-tazobactam (ZOSYN) IVPB 3.375 g        3.375 g 100 mL/hr over 30 Minutes Intravenous  Once 11/26/20 1812 11/26/20 1912        Subjective: Seen and examined and states that he does not have any abdominal pain.  States that he has a lot of fecal incontinence and want 5 times within 5 minutes.  No nausea or vomiting.  Denies any pain in his perineum today.  No other concerns or complaints at this time.  Objective: Vitals:   11/28/20 0238 11/28/20 0602 11/28/20 0807 11/28/20 1355  BP: 101/60 (!) 95/57 100/61 (!) 89/61  Pulse: 69 75 79 74  Resp: _0 Temp: 97.6 F (36.4 C) (!) 97.3 F (36.3 C)  (!) 97.5 F (36.4 C)  TempSrc:    Oral  SpO2: 99% 100% 98% 100%  Weight:      Height:        Intake/Output Summary (Last 24 hours) at 11/28/2020 1626 Last data filed at 11/28/2020 1300 Gross per 24 hour  Intake 480 ml  Output 900 ml  Net -420 ml   Filed Weights   11/26/20 1606  Weight: 90.7 kg   Examination: Physical Exam:  Constitutional: WN/WD chronically ill appearing AAM in NAD and appears calm but slighlthy uncomfortable Eyes: Lids and conjunctivae normal, sclerae anicteric  ENMT: External Ears, Nose appear normal. Grossly normal hearing.  Neck: Appears normal, supple, no cervical masses,  normal ROM, no appreciable thyromegaly; no JVD Respiratory: Diminished to auscultation bilaterally, no wheezing, rales, rhonchi or crackles. Normal respiratory effort and patient is not tachypenic. No accessory muscle use. Unlabored breathing  Cardiovascular: RRR, no murmurs / rubs / gallops. S1 and S2 auscultated. 1+ LE Edema Abdomen: Soft, non-tender, Distended. Bowel sounds positive.  GU: Deferred. Musculoskeletal: No clubbing / cyanosis of digits/nails. No joint deformity upper and lower extremities.  Skin: No rashes, lesions, ulcers on a limited skin evaluation. No induration; Warm and dry.  Neurologic: CN 2-12 grossly intact with no focal deficits. Romberg sign and cerebellar reflexes not assessed.  Psychiatric: Normal judgment and insight. Alert and oriented x 3. Normal mood and  appropriate affect.   Data Reviewed: I have personally reviewed following labs and imaging studies  CBC: Recent Labs  Lab 11/26/20 1814 11/27/20 0353 11/28/20 0541  WBC 14.6* 17.8* 16.0*  NEUTROABS 11.8*  --  12.7*  HGB 16.4 14.5 12.5*  HCT 49.0 43.6 38.2*  MCV 89.6 90.6 91.8  PLT 200 189 846   Basic Metabolic Panel: Recent Labs  Lab 11/26/20 1814 11/26/20 2225 11/27/20 0353 11/28/20 0541  NA 133*  --  133* 140  K 2.8*  --  3.3* 2.7*  CL 95*  --  99 109  CO2 25  --  21* 22  GLUCOSE 185*  --  240* 121*  BUN 31*  --  21 20  CREATININE 1.67*  --  1.34* 1.48*  CALCIUM 8.6*  --  8.1* 7.4*  MG  --  2.1 1.8 1.8  PHOS  --   --   --  2.5   GFR: Estimated Creatinine Clearance: 49.5 mL/min (A) (by C-G formula based on SCr of 1.48 mg/dL (H)). Liver Function Tests: Recent Labs  Lab 11/26/20 1814 11/27/20 0353 11/28/20 0541  AST 74* 60* 57*  ALT 35 32 30  ALKPHOS 84 68 71  BILITOT 1.2 1.6* 1.2  PROT 7.0 5.4* 4.8*  ALBUMIN 2.9* 2.2* 1.9*   No results for input(s): LIPASE, AMYLASE in the last 168 hours. No results for input(s): AMMONIA in the last 168 hours. Coagulation Profile: Recent Labs   Lab 11/26/20 1814  INR 1.1   Cardiac Enzymes: No results for input(s): CKTOTAL, CKMB, CKMBINDEX, TROPONINI in the last 168 hours. BNP (last 3 results) No results for input(s): PROBNP in the last 8760 hours. HbA1C: Recent Labs    11/27/20 0353 11/28/20 0541  HGBA1C 7.6* 7.5*   CBG: Recent Labs  Lab 11/27/20 1247 11/27/20 1559 11/27/20 2054 11/28/20 0804 11/28/20 1139  GLUCAP 149* 152* 131* 127* 137*   Lipid Profile: No results for input(s): CHOL, HDL, LDLCALC, TRIG, CHOLHDL, LDLDIRECT in the last 72 hours. Thyroid Function Tests: No results for input(s): TSH, T4TOTAL, FREET4, T3FREE, THYROIDAB in the last 72 hours. Anemia Panel: No results for input(s): VITAMINB12, FOLATE, FERRITIN, TIBC, IRON, RETICCTPCT in the last 72 hours. Sepsis Labs: Recent Labs  Lab 11/26/20 1814 11/26/20 2303  LATICACIDVEN 2.0* 1.5    Recent Results (from the past 240 hour(s))  Urine culture     Status: None   Collection Time: 11/26/20  5:48 PM   Specimen: In/Out Cath Urine  Result Value Ref Range Status   Specimen Description   Final    IN/OUT CATH URINE Performed at [facility], [address], [place], [place] [address]    Special Requests   Final    NONE Performed at [facility], [address] [place], [place] [address]    Culture   Final    NO GROWTH Performed at [facility], [place] [address] [place], [place] [address]    Report Status 11/28/2020 FINAL  Final  Resp Panel by RT-PCR (Flu A&B, Covid)     Status: None   Collection Time: 11/26/20  5:55 PM   Specimen: Nasopharyngeal(NP) swabs in vial transport medium  Result Value Ref Range Status   SARS Coronavirus 2 by RT PCR NEGATIVE NEGATIVE Final    Comment: (NOTE) SARS-CoV-2 target nucleic acids are NOT DETECTED.  The SARS-CoV-2 RNA is generally detectable in upper respiratory specimens during the acute phase of infection. The lowest concentration of SARS-CoV-2 viral copies this assay can  detect is 138  copies/mL. A negative result does not preclude SARS-Cov-2 infection and should not be used as the sole basis for treatment or other patient management decisions. A negative result may occur with  improper specimen collection/handling, submission of specimen other than nasopharyngeal swab, presence of viral mutation(s) within the areas targeted by this assay, and inadequate number of viral copies(<138 copies/mL). A negative result must be combined with clinical observations, patient history, and epidemiological information. The expected result is Negative.  Fact Sheet for Patients:  EntrepreneurPulse.com.au  Fact Sheet for Healthcare Providers:  IncredibleEmployment.be  This test is no t yet approved or cleared by the [place] FDA and  has been authorized for detection and/or diagnosis of SARS-CoV-2 by FDA under an Emergency Use Authorization (EUA). This EUA will remain  in effect (meaning this test can be used) for the duration of the COVID-19 declaration under Section 564(b)(1) of the Act, 21 U.S.C.section 360bbb-3(b)(1), unless the authorization is terminated  or revoked sooner.       Influenza A by PCR NEGATIVE NEGATIVE Final   Influenza B by PCR NEGATIVE NEGATIVE Final    Comment: (NOTE) The Xpert Xpress SARS-CoV-2/FLU/RSV plus assay is intended as an aid in the diagnosis of influenza from Nasopharyngeal swab specimens and should not be used as a sole basis for treatment. Nasal washings and aspirates are unacceptable for Xpert Xpress SARS-CoV-2/FLU/RSV testing.  Fact Sheet for Patients: EntrepreneurPulse.com.au  Fact Sheet for Healthcare Providers: IncredibleEmployment.be  This test is not yet approved or cleared by the [place] FDA and has been authorized for detection and/or diagnosis of SARS-CoV-2 by FDA under an Emergency Use Authorization (EUA). This EUA will remain in effect (meaning  this test can be used) for the duration of the COVID-19 declaration under Section 564(b)(1) of the Act, 21 U.S.C. section 360bbb-3(b)(1), unless the authorization is terminated or revoked.  Performed at [facility], [address], [place], [place] [address]   Blood culture (routine single)     Status: None (Preliminary result)   Collection Time: 11/26/20  6:14 PM   Specimen: BLOOD  Result Value Ref Range Status   Specimen Description BLOOD RIGHT ANTECUBITAL  Final   Special Requests   Final    BOTTLES DRAWN AEROBIC AND ANAEROBIC Blood Culture adequate volume   Culture   Final    NO GROWTH < 12 HOURS Performed at [facility], [address] [place], [place] [address]    Report Status PENDING  Incomplete  Aerobic/Anaerobic Culture w Gram Stain (surgical/deep wound)     Status: None (Preliminary result)   Collection Time: 11/27/20  1:16 PM   Specimen: Abscess  Result Value Ref Range Status   Specimen Description   Final    ABSCESS Performed at [facility], [address] [place], [place] [address]    Special Requests   Final    NONE Performed at [facility], [address] [place], [place] [address]    Gram Stain   Final    MODERATE WBC PRESENT,BOTH PMN AND MONONUCLEAR FEW GRAM POSITIVE COCCI FEW GRAM NEGATIVE RODS    Culture   Final    FEW GRAM NEGATIVE RODS CULTURE REINCUBATED FOR BETTER GROWTH Performed at [facility], [place] [address], [place], [place] [address]    Report Status PENDING  Incomplete    RN Pressure Injury Documentation:     Estimated body mass index is 27.12 kg/m as calculated from the following:   Height as of this encounter:  6' (1.829 m).   Weight as of this encounter: 90.7 kg.  Malnutrition Type:  Nutrition Problem: Increased nutrient needs Etiology: post-op healing (Incision and drainage of perirectal abcess)  Malnutrition Characteristics:  Signs/Symptoms: estimated needs  Nutrition Interventions:  Interventions: Ensure Enlive  (each supplement provides 350kcal and 20 grams of protein),MVI,Prostat    Radiology Studies: CT ABDOMEN PELVIS W CONTRAST  Result Date: 11/26/2020 CLINICAL DATA:  Abdominal abscess along the peroneal/rectal region. Chronic pancreatitis. History of prostate and colon cancer EXAM: CT ABDOMEN AND PELVIS WITH CONTRAST TECHNIQUE: Multidetector CT imaging of the abdomen and pelvis was performed using the standard protocol following bolus administration of intravenous contrast. CONTRAST:  43m OMNIPAQUE IOHEXOL 300 MG/ML  SOLN COMPARISON:  MRI pelvis 02/26/2020 and CT abdomen/pelvis 03/14/2015 FINDINGS: Lower chest: Gynecomastia. Mild cardiomegaly. Right coronary artery atherosclerotic vascular calcification. Dependent subsegmental atelectasis in the posterior basal segments of both lower lobes. Hepatobiliary: Probable hepatic steatosis. Mildly contracted gallbladder. Streak artifact from electronic device posterior to the patient partially obscures the upper abdomen. Pancreas: Coarse calcifications in the pancreatic parenchyma compatible with chronic calcific pancreatitis. Spleen: Unremarkable Adrenals/Urinary Tract: Mild bilateral renal sinus lipomatosis. Urinary bladder unremarkable. No urinary tract calculi are identified. The adrenal glands appear normal. 0.6 cm left kidney upper pole hypodense lesion is likely a cyst although technically too small to characterize, unchanged from 2016. Stomach/Bowel: Anastomotic staple line in the distal sigmoid colon. Normal appendix. Left eccentric intersphincteric perianal abscess along the left lower buttock likely extending into the subcutaneous tissues, measuring about 4.1 by 2.7 by 4.4 cm (volume = 26 cm^3), but also with a linear 3.2 cm extension cephalad along the posterior margin of the external sphincter. There is adjacent subcutaneous edema along the perineum. Vascular/Lymphatic: Aortoiliac atherosclerotic vascular disease. No pathologic adenopathy identified.  Reproductive: Brachytherapy seed implants in the small prostate gland. Other: Chronic 7.1 by 5.9 by 9.2 cm (volume = 200 cm^3) simple cystic lesion along the right groin, substantially increased in size from 03/14/2015 where this measured 3.0 by 1.9 by 3.9 cm (volume = 12 cm^3). Possibilities include seroma, lymphangioma, or ganglion cyst. Musculoskeletal: Bridging spurring of the left sacroiliac joint. Subtle sclerosis in the left iliac bone for example on image 62 series 2, increased from prior. Degenerative endplate sclerosis at the L3-4 level. IMPRESSION: 1. 26 cubic cm left perianal abscess with intersphincteric and subcutaneous components. There is also edema tracking along the perineum, and very careful clinical surveillance to exclude the possibility of a progressive fasciitis/Fournier's gangrene is recommended. 2. Substantial enlargement in the simple appearing cystic lesion along the right groin region, currently 200 cubic cm and previously 12 cubic cm. This could represent a seroma, lymphangioma, or ganglion cyst. 3. Subtle sclerosis in the left iliac bone compared to the right. Although the findings are subtle, strictly speaking I cannot exclude prostate metastatic lesion, and correlation with PSA level and/or bone scan would be suggested. 4. Other imaging findings of potential clinical significance: Mild cardiomegaly. Coronary atherosclerosis. Probable hepatic steatosis. Chronic calcific pancreatitis. Renal sinus lipomatosis. Aortic Atherosclerosis (ICD10-I70.0). Seed implants in the prostate gland. Electronically Signed   By: WVan ClinesM.D.   On: 11/26/2020 20:33   DG Chest Port 1 View  Result Date: 11/26/2020 CLINICAL DATA:  Fever for 4 days, sepsis EXAM: PORTABLE CHEST 1 VIEW COMPARISON:  10/06/2017 FINDINGS: 2 frontal views of the chest demonstrate an unremarkable cardiac silhouette. No acute airspace disease, effusion, or pneumothorax. No acute bony abnormalities. IMPRESSION: 1. No  acute intrathoracic process. Electronically Signed  By: Randa Ngo M.D.   On: 11/26/2020 18:45   Scheduled Meds: . (feeding supplement) PROSource Plus  30 mL Oral BID BM  . amLODipine  10 mg Oral q morning  . aspirin  325 mg Oral Once per day on Mon Thu  . feeding supplement  237 mL Oral BID BM  . heparin  5,000 Units Subcutaneous Q8H  . insulin aspart  0-5 Units Subcutaneous QHS  . [START ON 11/29/2020] insulin aspart  0-9 Units Subcutaneous TID WC  . insulin detemir  25 Units Subcutaneous QHS  . lisinopril  10 mg Oral Daily  . tamsulosin  0.4 mg Oral Daily  . traZODone  50 mg Oral QHS   Continuous Infusions: . sodium chloride 100 mL/hr at 11/27/20 2135  . piperacillin-tazobactam (ZOSYN)  IV 3.375 g (11/28/20 1125)    LOS: 2 days   Kerney Elbe, DO [facility] PAGER is on [place]  If 7PM-7AM, please contact night-coverage [email or web address]

## 2020-11-28 NOTE — Progress Notes (Signed)
[facility]  Wound flushed with saline. Will do every shift. Expect drainage. Ambulate  Iv antibiotics.  Curlene Labrum, MD [facility] [address] [place], [place] [address] [phone] (office)

## 2020-11-29 LAB — COMPREHENSIVE METABOLIC PANEL WITH GFR
ALT: 32 U/L (ref 0–44)
AST: 40 U/L (ref 15–41)
Albumin: 2.1 g/dL — ABNORMAL LOW (ref 3.5–5.0)
Alkaline Phosphatase: 87 U/L (ref 38–126)
Anion gap: 6 (ref 5–15)
BUN: 18 mg/dL (ref 8–23)
CO2: 24 mmol/L (ref 22–32)
Calcium: 8.3 mg/dL — ABNORMAL LOW (ref 8.9–10.3)
Chloride: 108 mmol/L (ref 98–111)
Creatinine, Ser: 1.27 mg/dL — ABNORMAL HIGH (ref 0.61–1.24)
GFR, Estimated: >60 mL/min
Glucose, Bld: 196 mg/dL — ABNORMAL HIGH (ref 70–99)
Potassium: 3.4 mmol/L — ABNORMAL LOW (ref 3.5–5.1)
Sodium: 138 mmol/L (ref 135–145)
Total Bilirubin: 0.9 mg/dL (ref 0.3–1.2)
Total Protein: 5.3 g/dL — ABNORMAL LOW (ref 6.5–8.1)

## 2020-11-29 LAB — CBC WITH DIFFERENTIAL/PLATELET
Abs Immature Granulocytes: 0.46 K/uL — ABNORMAL HIGH (ref 0.00–0.07)
Basophils Absolute: 0.1 K/uL (ref 0.0–0.1)
Basophils Relative: 1 %
Eosinophils Absolute: 0.2 K/uL (ref 0.0–0.5)
Eosinophils Relative: 2 %
HCT: 40.4 % (ref 39.0–52.0)
Hemoglobin: 13.3 g/dL (ref 13.0–17.0)
Immature Granulocytes: 4 %
Lymphocytes Relative: 12 %
Lymphs Abs: 1.5 K/uL (ref 0.7–4.0)
MCH: 29.8 pg (ref 26.0–34.0)
MCHC: 32.9 g/dL (ref 30.0–36.0)
MCV: 90.4 fL (ref 80.0–100.0)
Monocytes Absolute: 1.1 K/uL — ABNORMAL HIGH (ref 0.1–1.0)
Monocytes Relative: 9 %
Neutro Abs: 9.2 K/uL — ABNORMAL HIGH (ref 1.7–7.7)
Neutrophils Relative %: 72 %
Platelets: 259 K/uL (ref 150–400)
RBC: 4.47 MIL/uL (ref 4.22–5.81)
RDW: 13.7 % (ref 11.5–15.5)
WBC: 12.6 K/uL — ABNORMAL HIGH (ref 4.0–10.5)
nRBC: 0 % (ref 0.0–0.2)

## 2020-11-29 LAB — GLUCOSE, CAPILLARY
Glucose-Capillary: 157 mg/dL — ABNORMAL HIGH (ref 70–99)
Glucose-Capillary: 193 mg/dL — ABNORMAL HIGH (ref 70–99)
Glucose-Capillary: 185 mg/dL — ABNORMAL HIGH (ref 70–99)
Glucose-Capillary: 237 mg/dL — ABNORMAL HIGH (ref 70–99)

## 2020-11-29 LAB — MAGNESIUM: Magnesium: 2.1 mg/dL (ref 1.7–2.4)

## 2020-11-29 LAB — PHOSPHORUS: Phosphorus: 2.4 mg/dL — ABNORMAL LOW (ref 2.5–4.6)

## 2020-11-29 MED ORDER — INSULIN ASPART 100 UNIT/ML IJ SOLN
4.0000 [IU] | Freq: Three times a day (TID) | INTRAMUSCULAR | Status: DC
  Administered 2020-11-29 – 2020-11-30 (×3): 4 [IU] via SUBCUTANEOUS

## 2020-11-29 MED ORDER — K PHOS MONO-SOD PHOS DI & MONO 155-852-130 MG PO TABS
500.0000 mg | ORAL_TABLET | Freq: Once | ORAL | Status: AC
  Administered 2020-11-29: 500 mg via ORAL
  Filled 2020-11-29: qty 2

## 2020-11-29 MED ORDER — POTASSIUM CHLORIDE CRYS ER 20 MEQ PO TBCR
40.0000 meq | EXTENDED_RELEASE_TABLET | Freq: Two times a day (BID) | ORAL | Status: AC
  Administered 2020-11-29 (×2): 40 meq via ORAL
  Filled 2020-11-29: qty 2

## 2020-11-29 NOTE — Plan of Care (Signed)
  Problem: Acute Rehab PT Goals(only PT should resolve) Goal: Patient Will Transfer Sit To/From Stand Outcome: Progressing Flowsheets (Taken 11/29/2020 1402) Patient will transfer sit to/from stand: with modified independence Goal: Pt Will Transfer Bed To Chair/Chair To Bed Outcome: Progressing Flowsheets (Taken 11/29/2020 1402) Pt will Transfer Bed to Chair/Chair to Bed: with supervision Goal: Pt Will Ambulate Outcome: Progressing Flowsheets (Taken 11/29/2020 1402) Pt will Ambulate:  > 125 feet  with supervision Goal: Pt/caregiver will Perform Home Exercise Program Outcome: Progressing Flowsheets (Taken 11/29/2020 1402) Pt/caregiver will Perform Home Exercise Program:  For increased strengthening  For improved balance  With Supervision, verbal cues required/provided   Floria Raveling. Hartnett-Rands, MS, PT Per [place] [phone] 11/29/2020

## 2020-11-29 NOTE — Care Management Important Message (Signed)
Important Message  Patient Details  Name: Anthony [name] MRN: [id number] Date of Birth: April 11, 1946   Medicare Important Message Given:  Yes     Tommy Medal 11/29/2020, 11:25 AM

## 2020-11-29 NOTE — Evaluation (Signed)
Physical Therapy Evaluation Patient Details Name: Anthony [name] MRN: [id number] DOB: [birth year] Today's Date: 11/29/2020   History of Present Illness  Anthony [name]  is a 75 y.o. male, with history of T2DM, prostate cancer, HTN, HLD, GERD, Chronic calcified pancreatitis, and more presents to the ED with a chief complaint of back pain. Patient has had chronic back pain in the past, so he didn't think much of it. Patient reports that 2 days ago he noticed a bump in his perineum. He reports that the bump gradually became larger and more painful. The pain is pressure and sharp, and it is constant. 2 days after the pain started, it was so bad, he couldn't get out of bed. IT's worse with movement, and better with rest. He reports that it has not affected his BMs as he has fecal incontinence 25 times per day - per his report. This has been ongoing for years. He hasn't noticed any drainage or bleeding from this bump. He denies any fevers. He reports fatigue, weakness, and decreased appetite that has been ongoing since 2016.  Patient reports that he also has a seroma that's "filling back up." He has had it aspirated twice, and both times 276ml  Of fluid were collected - per his report.    Clinical Impression  Pt admitted with above diagnosis. Patient agreeable to participating in PT evaluation today. Wife present throughout session. Patient performed well during evaluation performing bed mobility modified independent with use of bed rails and slow movement. Patient able to transfer and ambulation with IV pole with supervision. Patient would benefit from ambulation with nursing to continue mobility and surgical recovery. Pt currently with functional limitations due to the deficits listed below (see PT Problem List). Pt will benefit from skilled PT to increase their independence and safety with mobility to allow discharge to the venue listed below.       Follow Up Recommendations No PT follow up    Equipment  Recommendations  Cane    Recommendations for Other Services       Precautions / Restrictions Precautions Precautions: Fall Precaution Comments: perirectal abscess with penrose drain placement X 2,seton placement to fistula tract (per post op documentation) Restrictions Weight Bearing Restrictions: No      Mobility  Bed Mobility Overal bed mobility: Modified Independent  General bed mobility comments: increased time, use of bedrails    Transfers Overall transfer level: Needs assistance Equipment used: None;Ambulation equipment used Transfers: Sit to/from [facility] Sit to Stand: Supervision Stand pivot transfers: Supervision       General transfer comment: Somewhat labored movement for sit to stand and stand pivot transfer. Unable to fully sit in chair due to pain from perirectal abscess.  Ambulation/Gait Ambulation/Gait assistance: Supervision;Min guard Gait Distance (Feet): 200 Feet Assistive device: IV Pole Gait Pattern/deviations: Step-through pattern;Decreased step length - right;Decreased step length - left;Decreased stride length Gait velocity: decreased   General Gait Details: slow, somewhat labored but steady gait w/ patient pushing IV pole; on room air; limited by fatigue and complaints of rectal discomfort; upon returning to seated on bed, patient's SpO2 87%; cues to take deep breathes; returned to 98% within 2 breathes  Stairs            Wheelchair Mobility    Modified Rankin (Stroke Patients Only)       Balance Overall balance assessment: Mild deficits observed, not formally tested          Pertinent Vitals/Pain Pain Assessment: 0-10 Pain Score: 5  Pain Location: rectum - post pain meds Pain Descriptors / Indicators: Sharp Pain Intervention(s): Limited activity within patient's tolerance;Monitored during session;Premedicated before session;Repositioned    Home Living Family/patient expects to be discharged to:: Private  residence Living Arrangements: Spouse/significant other Available Help at Discharge: Family;Available 24 hours/day Type of Home: House Home Access: Stairs to enter Entrance Stairs-Rails: None Entrance Stairs-Number of Steps: 1 Home Layout: Multi-level Home Equipment: Grab bars - tub/shower      Prior Function Level of Independence: Independent               Hand Dominance   Dominant Hand: Right    Extremity/Trunk Assessment   Upper Extremity Assessment Upper Extremity Assessment: Overall WFL for tasks assessed    Lower Extremity Assessment Lower Extremity Assessment: Defer to PT evaluation    Cervical / Trunk Assessment Cervical / Trunk Assessment: Normal  Communication   Communication: No difficulties  Cognition Arousal/Alertness: Awake/alert Behavior During Therapy: WFL for tasks assessed/performed Overall Cognitive Status: Within Functional Limits for tasks assessed      General Comments      Exercises     Assessment/Plan    PT Assessment Patient needs continued PT services  PT Problem List Decreased strength;Decreased activity tolerance;Pain;Decreased mobility       PT Treatment Interventions DME instruction;Gait training;Therapeutic activities;Therapeutic exercise;Functional mobility training    PT Goals (Current goals can be found in the Care Plan section)  Acute Rehab PT Goals Patient Stated Goal: return home PT Goal Formulation: With patient/family Time For Goal Achievement: 12/13/20 Potential to Achieve Goals: Good    Frequency Min 3X/week   Barriers to discharge           AM-PAC PT "6 Clicks" Mobility  Outcome Measure Help needed turning from your back to your side while in a flat bed without using bedrails?: None Help needed moving from lying on your back to sitting on the side of a flat bed without using bedrails?: None Help needed moving to and from a bed to a chair (including a wheelchair)?: A Little Help needed standing up  from a chair using your arms (e.g., wheelchair or bedside chair)?: A Little Help needed to walk in hospital room?: A Little Help needed climbing 3-5 steps with a railing? : A Little 6 Click Score: 20    End of Session   Activity Tolerance: Patient tolerated treatment well;Patient limited by fatigue;Patient limited by pain Patient left: in bed;with call bell/phone within reach;with family/visitor present Nurse Communication: Mobility status PT Visit Diagnosis: Other abnormalities of gait and mobility (R26.89);Difficulty in walking, not elsewhere classified (R26.2)    Time: 8850-2774 PT Time Calculation (min) (ACUTE ONLY): 25 min   Charges:   PT Evaluation $PT Eval Low Complexity: 1 Low PT Treatments $Therapeutic Activity: 8-22 mins        Floria Raveling. Hartnett-Rands, MS, PT Per [place] [phone]  Pamala Hurry  Hartnett-Rands 11/29/2020, 1:59 PM

## 2020-11-29 NOTE — Progress Notes (Signed)
PROGRESS NOTE    Anthony [name]  NTZ:[id number] DOB: 07/08/1946 DOA: 11/26/2020 PCP: Merrilee Seashore, MD   Brief Narrative:  The patient is a 75 yo overweigh AAM with a PMH significant for but not limited too T2DM, Prostate Cancer, HTN, HLD, GERD, Chronic Calcified Pancreatitis as well as other comorbidities who presented with CC of back pain.  Patient has had chronic back pain but that has and has not thought much of it but reportedly 2 days ago noticed a bump in his perineum.  Reports the bump in his perineum gradually became larger and more painful.  He describes a pressure and describes the pain as sharp and constant last 2 days and states is progressively gotten worse.  He states it has not affected his bowel movements but he does have fecal incontinence approximately 25 daily and has been having this for years.  He has not noticed any drainage or bleeding from his bowel.  He also reports that he has a stoma that is "filling back up".  States that this is been aspirated twice with both UA 200 mL at a time.  In the ED he is found to be septic with a temperature of 102.2, respirations were 29 and heart rate of 103.  Leukocytosis was 14.6.  He was admitted for severe sepsis in the setting of 26 cubic cm of perineal abscess with intersphinteric and subacute components with possibility of progressive fasciitis.  General surgery was consulted and patient was taken for incision and drainage of his perineal abscess with Penrose drain placement x2 and seton placement to the fistula tract on 11/27/2020 and he is is postoperative day 1.  His intraoperative cultures are showing few gram-negative rods and few gram-positive cocci. General surgery recommends sitz bath's.  Now patient is hospitalization has been complicated by hypotension so his antihypertensives have been discontinued.  Assessment & Plan:   Principal Problem:   Peri-rectal abscess  Severe sepsis present on admission secondary to perirectal abscess  with concern for Fournier's gangrene ruled out -Patient met SIRS criteria on admission with a temperature of 102.7, pulse rate of 103, respiratory rate of 29, and a WBC of 14.6  -WBC is improved to 12.6 after a peak of 17.8 -Patient's lactic acid level was 2.0 on admission does characterizing severe sepsis -General surgery has been consulted and plan to take the patient to the OR for incision and drainage -Patient received 4 L boluses of the ED of lactated Ringer's and is now on maintenance IV fluids with normal saline at 100 MLS per hour but will stop  -Pressures remain on the softer side so we will hold his antihypertensives for now including his lisinopril and amlodipine -Urinalysis on admission showed moderate hemoglobin and 20 ketones but negative leukocytes, negative nitrites and no bacteria seen -Blood culture pending and Urine Cx Negative -CT scan of the abdomen pelvis done and showed "26 cubic cm left perianal abscess with intersphincteric and subcutaneous components. There is also edema tracking along the perineum, and very careful clinical surveillance to exclude the possibility of a progressive fasciitis/Fournier's gangrene is recommended. Substantial enlargement in the simple appearing cystic lesion along the right groin region, currently 200 cubic cm and previously 12 cubic cm. This could represent a seroma, lymphangioma, or ganglion cyst.  Subtle sclerosis in the left iliac bone compared to the right. Although the findings are subtle, strictly speaking I cannot exclude prostate metastatic lesion, and correlation with PSA level and/or bone scan would be suggested. 4. Other imaging  findings of potential clinical significance: Mild cardiomegaly. Coronary atherosclerosis. Probable hepatic steatosis. Chronic calcific pancreatitis. Renal sinus lipomatosis. Aortic Atherosclerosis (ICD10-I70.0). Seed implants in the prostate gland." -Continue IV antibiotic coverage with IV Zosyn for now every 8  hours  -Continue supportive care and continue with antiemetics with ondansetron 4 mg p.o./IV every 6 hours as needed nausea -Intraoperative CX done and Sowed Few GPC and Few GNR; GNR turned out to pe Pan-sensitive E Coli -We will also continue pain control with IV morphine 2 mg every 2 hours as needed severe pain, p.o. oxycodone IR 5 mg every 4 hours as needed for moderate pain as well as acetaminophen 650 mg p.o./RC every 6 as needed for mild pain or fever -General surgery Flushed Penrose and he did well overall. Surgery recommending RN flushing Penrose  Each shift  -Follow Cx results  -PT/OT recommend Supervision and no PT follow up  Hyponatremia -Mild and improved -Patient sodium was 133 and improved to 140 yesterday and today is 138; currently getting normal saline at 100 MLS per hour -Continue to monitor and trend and repeat CMP in a.m.  Hypokalemia -Patient's potassium today improved form 2.7 to 3.4 -Replete with po KCl 40 mEQ BID and with po K Phos Neutral 500 mg po Daily  -Mag Level was 2.1 -Continue monitor and trend and replete as necessary -Repeat CMP in a.m.  AKI, improving  Metabolic acidosis -Presented with a BUNs/creatinine of 31/1.67; baseline creatinine of 1.10 -Likely was prerenal and Now BUN/Cr had improved to 21/1.34 -> 20/1.48 -> 18/1.27 -Patient has a small metabolic acidosis with a CO2 of 24, anion gap of 6, chloride level of 108 -Was getting normal saline at 100 MLS per hour but will now stop given advancement of Diet  -Avoid nephrotoxic medications, contrast dyes, hypotension renally dose medications -Blood pressure was on the softer side so we will hold his amlodipine and lisinopril for now -Repeat CMP in a.m.  Hypophosphatemia -Patient's Phos Level was 2.4 -Replete with po K Phos Neutral 500 mg x1 -Continue to Monitor and Replete as Necessary -Repeat Phos Level in the AM   Hyperbilirubinemia -Patient is to bili went from 1.2 -> 1.6 -> 1.2 ->  0.9 -Likely reactive we will continue to monitor and trend -Repeat CMP in a.m.  Hyperlipidemia -Patient likely statin intolerant and continues Evolocumab SOSY 140 mg sq q14days  Perirectal Abscess -As above; General Surgery consulted and plans to take the patient for I&D on 11/27/20  Chronic Pancreatitis With Chronic Diarrhea -Start Creon  -Likely has Fecal Incontinence from Prior Surgeries -Continue to Monitor   Mild protein calorie malnutrition -Patient is currently n.p.o. for procedure tomorrow, consider adding protein shakes when patient is able to tolerate p.o. -Will consult Nutritionist for further evaluation and recommendations -Dietitian is recommending increasing nutrient needs and given the patient protein content in foods  Diabetes Mellitus Type 2 -Continue long-acting insulin and short acting sliding scale with Diabetes Education Recc's. Will order Novolog 4 units TIDwm for Meal Coverage  -DC home insulin pump while hospitalized -Last hemoglobin A1c was documented in her system was 7.6 -Repeat hemoglobin A1c while patient is hospitalized here -Will consult Diabetes Education coordinator for further blood sugar assistance and management  -CBG's Ranging from 137-336  History of prostate cancer -Diagnosed in August 02, 2017 as stage I TC Gleason 3+4 -PSA at that time was 5.07 -He is status post radioactive seed implants on 11/29/2017 -We will check PSA again given findings on CT scan as above as  they showed "Subtle sclerosis in the left iliac bone compared to the right. Although the findings are subtle, strictly speaking I cannot exclude prostate metastatic lesion, and correlation with PSA level and/or bone scan would be suggested." -PSA was stable at 0.12  DVT prophylaxis: Heparin 5,000 units sq Code Status: FULL CODE  Family Communication: Discussed with wife at bedside  Disposition Plan: Pending general surgery clearance as well as evaluation by PT OT  Status is:  Inpatient  Remains inpatient appropriate because:Unsafe d/c plan, IV treatments appropriate due to intensity of illness or inability to take PO and Inpatient level of care appropriate due to severity of illness   Dispo: The patient is from: Home              Anticipated d/c is to: TBD              Patient currently is not medically stable to d/c.   Difficult to place patient No  Consultants:   General Surgery  Procedures:  Performed by Dr. Curlene Labrum: Incision and drainage of perirectal abscess with penrose drain placement X 2, seton placement to fistula track    Antimicrobials:  Anti-infectives (From admission, onward)   Start     Dose/Rate Route Frequency Ordered Stop   11/27/20 1100  cefoTEtan (CEFOTAN) 2 g in sodium chloride 0.9 % 100 mL IVPB        2 g 200 mL/hr over 30 Minutes Intravenous On call to O.R. 11/27/20 1007 11/27/20 1203   11/27/20 0400  piperacillin-tazobactam (ZOSYN) IVPB 3.375 g  Status:  Discontinued        3.375 g 100 mL/hr over 30 Minutes Intravenous Every 8 hours 11/26/20 2230 11/26/20 2252   11/27/20 0200  piperacillin-tazobactam (ZOSYN) IVPB 3.375 g        3.375 g 12.5 mL/hr over 240 Minutes Intravenous Every 8 hours 11/26/20 2252     11/26/20 1815  piperacillin-tazobactam (ZOSYN) IVPB 3.375 g        3.375 g 100 mL/hr over 30 Minutes Intravenous  Once 11/26/20 1812 11/26/20 1912        Subjective: Seen and examined and states that he is doing okay.  No nausea or vomiting.  Thinks he is doing a bit better and his numbers are improved.  Still having quite a bit of fecal incontinence.  No other concerns or complaints at this time and he is getting up with therapy and ambulating.  His numbers are improving.  No other concerns or questions time.  Objective: Vitals:   11/28/20 1355 11/28/20 2116 11/28/20 2346 11/29/20 0425  BP: (!) 89/61 113/83 108/71 100/64  Pulse: 74 71 78 65  Resp: _0 Temp: (!) 97.5 F (36.4 C) 97.7 F (36.5 C)  98.2  F (36.8 C)  TempSrc: Oral     SpO2: 100% 98%  99%  Weight:      Height:        Intake/Output Summary (Last 24 hours) at 11/29/2020 1702 Last data filed at 11/29/2020 1133 Gross per 24 hour  Intake 2133.27 ml  Output --  Net 2133.27 ml   Filed Weights   11/26/20 1606  Weight: 90.7 kg   Examination: Physical Exam:  Constitutional: WN/WD ill-appearing African-American male currently in no acute distress appears calm Eyes: Lids and conjunctivae normal, sclerae anicteric  ENMT: External Ears, Nose appear normal. Grossly normal hearing.  Neck: Appears normal, supple, no cervical masses, normal ROM, no appreciable thyromegaly; no JVD Respiratory: Diminished  to auscultation bilaterally, no wheezing, rales, rhonchi or crackles. Normal respiratory effort and patient is not tachypenic. No accessory muscle use.  Unlabored breathing Cardiovascular: RRR, no murmurs / rubs / gallops. S1 and S2 auscultated.  Has mild 1+ extremity Abdomen: Soft, non-tender, distended secondary to body habitus. Bowel sounds positive.  GU: Deferred. Musculoskeletal: No clubbing / cyanosis of digits/nails. No joint deformity upper and lower extremities.  Skin: No rashes, lesions, ulcers on a limited skin evaluation. No induration; Warm and dry.  Neurologic: CN 2-12 grossly intact with no focal deficits. Romberg sign and cerebellar reflexes not assessed.  Psychiatric: Normal judgment and insight. Alert and oriented x 3. Normal mood and appropriate affect.   Data Reviewed: I have personally reviewed following labs and imaging studies  CBC: Recent Labs  Lab 11/26/20 1814 11/27/20 0353 11/28/20 0541 11/29/20 0551  WBC 14.6* 17.8* 16.0* 12.6*  NEUTROABS 11.8*  --  12.7* 9.2*  HGB 16.4 14.5 12.5* 13.3  HCT 49.0 43.6 38.2* 40.4  MCV 89.6 90.6 91.8 90.4  PLT 200 189 188 537   Basic Metabolic Panel: Recent Labs  Lab 11/26/20 1814 11/26/20 2225 11/27/20 0353 11/28/20 0541 11/29/20 0551  NA 133*  --  133*  140 138  K 2.8*  --  3.3* 2.7* 3.4*  CL 95*  --  99 109 108  CO2 25  --  21* 22 24  GLUCOSE 185*  --  240* 121* 196*  BUN 31*  --  _0 CREATININE 1.67*  --  1.34* 1.48* 1.27*  CALCIUM 8.6*  --  8.1* 7.4* 8.3*  MG  --  2.1 1.8 1.8 2.1  PHOS  --   --   --  2.5 2.4*   GFR: Estimated Creatinine Clearance: 57.7 mL/min (A) (by C-G formula based on SCr of 1.27 mg/dL (H)). Liver Function Tests: Recent Labs  Lab 11/26/20 1814 11/27/20 0353 11/28/20 0541 11/29/20 0551  AST 74* 60* 57* 40  ALT 35 32 30 32  ALKPHOS 84 68 71 87  BILITOT 1.2 1.6* 1.2 0.9  PROT 7.0 5.4* 4.8* 5.3*  ALBUMIN 2.9* 2.2* 1.9* 2.1*   No results for input(s): LIPASE, AMYLASE in the last 168 hours. No results for input(s): AMMONIA in the last 168 hours. Coagulation Profile: Recent Labs  Lab 11/26/20 1814  INR 1.1   Cardiac Enzymes: No results for input(s): CKTOTAL, CKMB, CKMBINDEX, TROPONINI in the last 168 hours. BNP (last 3 results) No results for input(s): PROBNP in the last 8760 hours. HbA1C: Recent Labs    11/27/20 0353 11/28/20 0541  HGBA1C 7.6* 7.5*   CBG: Recent Labs  Lab 11/28/20 1632 11/28/20 2120 11/29/20 0809 11/29/20 1129 11/29/20 1647  GLUCAP 335* 336* 157* 193* 185*   Lipid Profile: No results for input(s): CHOL, HDL, LDLCALC, TRIG, CHOLHDL, LDLDIRECT in the last 72 hours. Thyroid Function Tests: No results for input(s): TSH, T4TOTAL, FREET4, T3FREE, THYROIDAB in the last 72 hours. Anemia Panel: No results for input(s): VITAMINB12, FOLATE, FERRITIN, TIBC, IRON, RETICCTPCT in the last 72 hours. Sepsis Labs: Recent Labs  Lab 11/26/20 1814 11/26/20 2303  LATICACIDVEN 2.0* 1.5    Recent Results (from the past 240 hour(s))  Urine culture     Status: None   Collection Time: 11/26/20  5:48 PM   Specimen: In/Out Cath Urine  Result Value Ref Range Status   Specimen Description   Final    IN/OUT CATH URINE Performed at [facility], [address], [place], [place]  [address]    Special Requests   Final    NONE Performed at [facility], [address], [place], [place] [address]    Culture   Final    NO GROWTH Performed at [facility], [place] [address] [place], [place] [address]    Report Status 11/28/2020 FINAL  Final  Resp Panel by RT-PCR (Flu A&B, Covid)     Status: None   Collection Time: 11/26/20  5:55 PM   Specimen: Nasopharyngeal(NP) swabs in vial transport medium  Result Value Ref Range Status   SARS Coronavirus 2 by RT PCR NEGATIVE NEGATIVE Final    Comment: (NOTE) SARS-CoV-2 target nucleic acids are NOT DETECTED.  The SARS-CoV-2 RNA is generally detectable in upper respiratory specimens during the acute phase of infection. The lowest concentration of SARS-CoV-2 viral copies this assay can detect is 138 copies/mL. A negative result does not preclude SARS-Cov-2 infection and should not be used as the sole basis for treatment or other patient management decisions. A negative result may occur with  improper specimen collection/handling, submission of specimen other than nasopharyngeal swab, presence of viral mutation(s) within the areas targeted by this assay, and inadequate number of viral copies(<138 copies/mL). A negative result must be combined with clinical observations, patient history, and epidemiological information. The expected result is Negative.  Fact Sheet for Patients:  EntrepreneurPulse.com.au  Fact Sheet for Healthcare Providers:  IncredibleEmployment.be  This test is no t yet approved or cleared by the [place] FDA and  has been authorized for detection and/or diagnosis of SARS-CoV-2 by FDA under an Emergency Use Authorization (EUA). This EUA will remain  in effect (meaning this test can be used) for the duration of the COVID-19 declaration under Section 564(b)(1) of the Act, 21 U.S.C.section 360bbb-3(b)(1), unless the authorization is terminated  or revoked sooner.        Influenza A by PCR NEGATIVE NEGATIVE Final   Influenza B by PCR NEGATIVE NEGATIVE Final    Comment: (NOTE) The Xpert Xpress SARS-CoV-2/FLU/RSV plus assay is intended as an aid in the diagnosis of influenza from Nasopharyngeal swab specimens and should not be used as a sole basis for treatment. Nasal washings and aspirates are unacceptable for Xpert Xpress SARS-CoV-2/FLU/RSV testing.  Fact Sheet for Patients: EntrepreneurPulse.com.au  Fact Sheet for Healthcare Providers: IncredibleEmployment.be  This test is not yet approved or cleared by the [place] FDA and has been authorized for detection and/or diagnosis of SARS-CoV-2 by FDA under an Emergency Use Authorization (EUA). This EUA will remain in effect (meaning this test can be used) for the duration of the COVID-19 declaration under Section 564(b)(1) of the Act, 21 U.S.C. section 360bbb-3(b)(1), unless the authorization is terminated or revoked.  Performed at [facility], [address] [place], [place] [address]   Blood culture (routine single)     Status: None (Preliminary result)   Collection Time: 11/26/20  6:14 PM   Specimen: BLOOD  Result Value Ref Range Status   Specimen Description BLOOD RIGHT ANTECUBITAL  Final   Special Requests   Final    BOTTLES DRAWN AEROBIC AND ANAEROBIC Blood Culture adequate volume   Culture   Final    NO GROWTH 3 DAYS Performed at [facility], [address] [place], [place] [address]    Report Status PENDING  Incomplete  Aerobic/Anaerobic Culture w Gram Stain (surgical/deep wound)     Status: None (Preliminary result)   Collection Time: 11/27/20  1:16 PM   Specimen: Abscess  Result Value  Ref Range Status   Specimen Description   Final    ABSCESS Performed at [facility], [address], [place], [place] [address]    Special Requests   Final    NONE Performed at [facility], [address], [place], [place] [address]    Gram Stain    Final    MODERATE WBC PRESENT,BOTH PMN AND MONONUCLEAR FEW GRAM POSITIVE COCCI FEW GRAM NEGATIVE RODS Performed at [facility], [place] [address], [place], [place] [address]    Culture   Final    FEW ESCHERICHIA COLI CULTURE REINCUBATED FOR BETTER GROWTH NO ANAEROBES ISOLATED; CULTURE IN PROGRESS FOR 5 DAYS    Report Status PENDING  Incomplete   Organism ID, Bacteria ESCHERICHIA COLI  Final      Susceptibility   Escherichia coli - MIC*    AMPICILLIN <=2 SENSITIVE Sensitive     CEFAZOLIN <=4 SENSITIVE Sensitive     CEFEPIME <=0.12 SENSITIVE Sensitive     CEFTAZIDIME <=1 SENSITIVE Sensitive     CEFTRIAXONE <=0.25 SENSITIVE Sensitive     CIPROFLOXACIN <=0.25 SENSITIVE Sensitive     GENTAMICIN <=1 SENSITIVE Sensitive     IMIPENEM <=0.25 SENSITIVE Sensitive     TRIMETH/SULFA <=20 SENSITIVE Sensitive     AMPICILLIN/SULBACTAM <=2 SENSITIVE Sensitive     PIP/TAZO <=4 SENSITIVE Sensitive     * FEW ESCHERICHIA COLI    RN Pressure Injury Documentation:     Estimated body mass index is 27.12 kg/m as calculated from the following:   Height as of this encounter: 6' (1.829 m).   Weight as of this encounter: 90.7 kg.  Malnutrition Type:  Nutrition Problem: Increased nutrient needs Etiology: post-op healing (Incision and drainage of perirectal abcess)  Malnutrition Characteristics:  Signs/Symptoms: estimated needs  Nutrition Interventions:  Interventions: Ensure Enlive (each supplement provides 350kcal and 20 grams of protein),MVI,Prostat    Radiology Studies: No results found. Scheduled Meds: . (feeding supplement) PROSource Plus  30 mL Oral BID BM  . aspirin  325 mg Oral Once per day on Mon Thu  . feeding supplement  237 mL Oral BID BM  . heparin  5,000 Units Subcutaneous Q8H  . insulin aspart  0-5 Units Subcutaneous QHS  . insulin aspart  0-9 Units Subcutaneous TID WC  . insulin detemir  25 Units Subcutaneous QHS  . lipase/protease/amylase  36,000 Units Oral TID AC   . potassium chloride  40 mEq Oral BID  . tamsulosin  0.4 mg Oral Daily  . traZODone  50 mg Oral QHS   Continuous Infusions: . sodium chloride 100 mL/hr at 11/29/20 0818  . piperacillin-tazobactam (ZOSYN)  IV 3.375 g (11/29/20 5956)    LOS: 3 days   Kerney Elbe, DO [facility] PAGER is on AMION  If 7PM-7AM, please contact night-coverage [email or web address]

## 2020-11-29 NOTE — Evaluation (Signed)
Occupational Therapy Evaluation Patient Details Name: Anthony [name] MRN: [id number] DOB: 09-26-1945 Today's Date: 11/29/2020    History of Present Illness Anthony [name]  is a 75 y.o. male, with history of T2DM, prostate cancer, HTN, HLD, GERD, Chronic calcified pancreatitis, and more presents to the ED with a chief complaint of back pain. Patient has had chronic back pain in the past, so he didn't think much of it. Patient reports that 2 days ago he noticed a bump in his perineum. He reports that the bump gradually became larger and more painful. The pain is pressure and sharp, and it is constant. 2 days after the pain started, it was so bad, he couldn't get out of bed. IT's worse with movement, and better with rest. He reports that it has not affected his BMs as he has fecal incontinence 25 times per day - per his report. This has been ongoing for years. He hasn't noticed any drainage or bleeding from this bump. He denies any fevers. He reports fatigue, weakness, and decreased appetite that has been ongoing since 2016.  Patient reports that he also has a seroma that's "filling back up." He has had it aspirated twice, and both times 282ml  Of fluid were collected - per his report.   Clinical Impression   Pt agreeable to OT evaluation this date. Pt demonstrated bed mobility at a level of modified independence due to somewhat labored movement. Pt was able to complete a stand pivot transfer from EOB to chair at a level of Mod I, but the pt was unable to remain seated in the chair due to pain from the perirectal abscess. Pt may require SPV for more ambulatory transfers. Pt is not recommended for further OT in the hospital setting due to being at a level of Mod I for items observed this date. Pt will be discharged to care of nursing staff for the duration of the pt's stay.     Follow Up Recommendations  Supervision - Intermittent;Other (comment) (possible SPV for transfers due to pt lack of being out of bed.)     Equipment Recommendations  None recommended by OT           Precautions / Restrictions Precautions Precautions: Fall Precaution Comments: perirectal abscess with penrose drain placement X 2,seton placement to fistula tract (per post op documentation)      Mobility Bed Mobility Overal bed mobility: Modified Independent             General bed mobility comments: somehwat labored movement    Transfers Overall transfer level: Modified independent               General transfer comment: Somewhat labored movement for sit to stand and stand pivot transfer. Unable to fully sit in chair due to pain from perirectal abscess.    Balance Overall balance assessment: Mild deficits observed, not formally tested                                         ADL either performed or assessed with clinical judgement   ADL Overall ADL's : Modified independent                                       General ADL Comments: per clinical judgment due to performance with bed mobility  and functional transfers.     Vision Baseline Vision/History: Cataracts (previous cataracts surgery) Patient Visual Report: No change from baseline                  Pertinent Vitals/Pain Pain Assessment: 0-10 Pain Score: 8  Pain Location: L side of sphincter muscle Pain Descriptors / Indicators: Sharp Pain Intervention(s): Limited activity within patient's tolerance;Monitored during session;Repositioned     Hand Dominance Right   Extremity/Trunk Assessment Upper Extremity Assessment Upper Extremity Assessment: Overall WFL for tasks assessed   Lower Extremity Assessment Lower Extremity Assessment: Defer to PT evaluation   Cervical / Trunk Assessment Cervical / Trunk Assessment: Normal   Communication Communication Communication: No difficulties   Cognition Arousal/Alertness: Awake/alert Behavior During Therapy: WFL for tasks assessed/performed Overall  Cognitive Status: Within Functional Limits for tasks assessed                                                      Home Living Family/patient expects to be discharged to:: Private residence Living Arrangements: Spouse/significant other Available Help at Discharge: Family;Available 24 hours/day Type of Home: House Home Access: Stairs to enter [employer] of Steps: 1 Entrance Stairs-Rails: None Home Layout: Multi-level Alternate Level Stairs-Number of Steps: 13 (basement) Alternate Level Stairs-Rails: Left (going up) Bathroom Shower/Tub: Teacher, early years/pre: Standard     Home Equipment: Shower seat - built in;Grab bars - tub/shower          Prior Functioning/Environment Level of Independence: Independent                               OT Goals(Current goals can be found in the care plan section) Acute Rehab OT Goals Patient Stated Goal: return home  OT Frequency:         End of Session Equipment Utilized During Treatment: Oxygen (3L O2 via nasal cannula) Nurse Communication: Other (comment) (need for seat donut to tolerate sitting in chair.)  Activity Tolerance: Patient tolerated treatment well Patient left: in bed;with call bell/phone within reach;with family/visitor present  OT Visit Diagnosis: Unsteadiness on feet (R26.81)                Time: 0936-1000 OT Time Calculation (min): 24 min Charges:  OT General Charges $OT Visit: 1 Visit OT Evaluation $OT Eval Low Complexity: Devon OT, MOT   Larey Seat 11/29/2020, 12:40 PM

## 2020-11-29 NOTE — Progress Notes (Signed)
Inpatient Diabetes Program Recommendations  AACE/ADA: New Consensus Statement on Inpatient Glycemic Control   Target Ranges:  Prepandial:   less than 140 mg/dL      Peak postprandial:   less than 180 mg/dL (1-2 hours)      Critically ill patients:  140 - 180 mg/dL  Results for RAJAN, BURGARD (MRN [id number]) as of 11/29/2020 07:18  Ref. Range 11/29/2020 05:51  Glucose Latest Ref Range: 70 - 99 mg/dL 196 (H)   Results for KAIMANA, LURZ (MRN [id number]) as of 11/29/2020 07:18  Ref. Range 11/28/2020 08:04 11/28/2020 11:39 11/28/2020 16:32 11/28/2020 21:20  Glucose-Capillary Latest Ref Range: 70 - 99 mg/dL 127 (H) 137 (H) 335 (H) 336 (H)   Review of Glycemic Control  Diabetes history: DM2 Outpatient Diabetes medications: OmniPod insulin pump with Novolog Current orders for Inpatient glycemic control: Levemir 25 units QHS, Novolog 0-9 units TID with meals, Novolog 0-5 units QHS  Inpatient Diabetes Program Recommendations:    Insulin: Please consider ordering Novolog 4 units TID with meals for meal coverage if patient eats at least 50% of meals.  Thanks, Barnie Alderman, RN, MSN, CDE Diabetes Coordinator Inpatient Diabetes Program [phone] (Team Pager from 8am to 5pm)

## 2020-11-30 DIAGNOSIS — M5441 Lumbago with sciatica, right side: Secondary | ICD-10-CM

## 2020-11-30 DIAGNOSIS — R6520 Severe sepsis without septic shock: Secondary | ICD-10-CM

## 2020-11-30 DIAGNOSIS — Z8639 Personal history of other endocrine, nutritional and metabolic disease: Secondary | ICD-10-CM

## 2020-11-30 DIAGNOSIS — A419 Sepsis, unspecified organism: Secondary | ICD-10-CM

## 2020-11-30 DIAGNOSIS — E876 Hypokalemia: Secondary | ICD-10-CM

## 2020-11-30 DIAGNOSIS — K610 Anal abscess: Secondary | ICD-10-CM

## 2020-11-30 LAB — COMPREHENSIVE METABOLIC PANEL
ALT: 28 U/L (ref 0–44)
AST: 32 U/L (ref 15–41)
Albumin: 2.1 g/dL — ABNORMAL LOW (ref 3.5–5.0)
Alkaline Phosphatase: 93 U/L (ref 38–126)
Anion gap: 6 (ref 5–15)
BUN: 11 mg/dL (ref 8–23)
CO2: 24 mmol/L (ref 22–32)
Calcium: 8.7 mg/dL — ABNORMAL LOW (ref 8.9–10.3)
Chloride: 106 mmol/L (ref 98–111)
Creatinine, Ser: 1.09 mg/dL (ref 0.61–1.24)
GFR, Estimated: >60 mL/min (ref >60)
Glucose, Bld: 277 mg/dL — ABNORMAL HIGH (ref 70–99)
Potassium: 3.3 mmol/L — ABNORMAL LOW (ref 3.5–5.1)
Sodium: 136 mmol/L (ref 135–145)
Total Bilirubin: 0.8 mg/dL (ref 0.3–1.2)
Total Protein: 5.4 g/dL — ABNORMAL LOW (ref 6.5–8.1)

## 2020-11-30 LAB — CBC WITH DIFFERENTIAL/PLATELET
Abs Immature Granulocytes: 0.57 10*3/uL — ABNORMAL HIGH (ref 0.00–0.07)
Basophils Absolute: 0.1 10*3/uL (ref 0.0–0.1)
Basophils Relative: 1 %
Eosinophils Absolute: 0.2 10*3/uL (ref 0.0–0.5)
Eosinophils Relative: 2 %
HCT: 40.5 % (ref 39.0–52.0)
Hemoglobin: 13.6 g/dL (ref 13.0–17.0)
Immature Granulocytes: 6 %
Lymphocytes Relative: 13 %
Lymphs Abs: 1.2 10*3/uL (ref 0.7–4.0)
MCH: 30.4 pg (ref 26.0–34.0)
MCHC: 33.6 g/dL (ref 30.0–36.0)
MCV: 90.4 fL (ref 80.0–100.0)
Monocytes Absolute: 0.9 10*3/uL (ref 0.1–1.0)
Monocytes Relative: 9 %
Neutro Abs: 6.9 10*3/uL (ref 1.7–7.7)
Neutrophils Relative %: 69 %
Platelets: 300 10*3/uL (ref 150–400)
RBC: 4.48 MIL/uL (ref 4.22–5.81)
RDW: 13.5 % (ref 11.5–15.5)
WBC: 9.8 10*3/uL (ref 4.0–10.5)
nRBC: 0 % (ref 0.0–0.2)

## 2020-11-30 LAB — GLUCOSE, CAPILLARY
Glucose-Capillary: 254 mg/dL — ABNORMAL HIGH (ref 70–99)
Glucose-Capillary: 125 mg/dL — ABNORMAL HIGH (ref 70–99)

## 2020-11-30 LAB — PHOSPHORUS: Phosphorus: 2.6 mg/dL (ref 2.5–4.6)

## 2020-11-30 LAB — MAGNESIUM: Magnesium: 1.7 mg/dL (ref 1.7–2.4)

## 2020-11-30 MED ORDER — MAGNESIUM OXIDE -MG SUPPLEMENT 400 (240 MG) MG PO TABS
800.0000 mg | ORAL_TABLET | Freq: Once | ORAL | Status: AC
  Administered 2020-11-30: 800 mg via ORAL
  Filled 2020-11-30: qty 2

## 2020-11-30 MED ORDER — ONDANSETRON HCL 4 MG PO TABS: 4.0000 mg | ORAL_TABLET | Freq: Four times a day (QID) | ORAL | 0 refills | Status: DC | PRN

## 2020-11-30 MED ORDER — ACETAMINOPHEN 325 MG PO TABS: 650.0000 mg | ORAL_TABLET | Freq: Four times a day (QID) | ORAL | 0 refills | Status: DC | PRN

## 2020-11-30 MED ORDER — POTASSIUM CHLORIDE CRYS ER 20 MEQ PO TBCR
40.0000 meq | EXTENDED_RELEASE_TABLET | Freq: Two times a day (BID) | ORAL | Status: DC
  Administered 2020-11-30: 40 meq via ORAL
  Filled 2020-11-30: qty 2

## 2020-11-30 MED ORDER — MAGNESIUM SULFATE 2 GM/50ML IV SOLN
2.0000 g | Freq: Once | INTRAVENOUS | Status: DC
  Filled 2020-11-30: qty 50

## 2020-11-30 MED ORDER — AMOXICILLIN-POT CLAVULANATE 875-125 MG PO TABS: 1.0000 | ORAL_TABLET | Freq: Two times a day (BID) | ORAL | 0 refills | Status: DC

## 2020-11-30 MED ORDER — PROSOURCE PLUS PO LIQD: 30.0000 mL | Freq: Two times a day (BID) | ORAL | 0 refills | Status: DC

## 2020-11-30 MED ORDER — PANCRELIPASE (LIP-PROT-AMYL) 36000-114000 UNITS PO CPEP: 36000.0000 [IU] | ORAL_CAPSULE | Freq: Three times a day (TID) | ORAL | 0 refills | Status: AC

## 2020-11-30 MED ORDER — OXYCODONE HCL 5 MG PO TABS: 5.0000 mg | ORAL_TABLET | ORAL | 0 refills | Status: DC | PRN

## 2020-11-30 MED ORDER — ENSURE ENLIVE PO LIQD: 237.0000 mL | Freq: Two times a day (BID) | ORAL | 12 refills | Status: DC

## 2020-11-30 NOTE — Progress Notes (Signed)
Inpatient Diabetes Program Recommendations  AACE/ADA: New Consensus Statement on Inpatient Glycemic Control (2015)  Target Ranges:  Prepandial:   less than 140 mg/dL      Peak postprandial:   less than 180 mg/dL (1-2 hours)      Critically ill patients:  140 - 180 mg/dL   Results for Anthony [name], Anthony [name] (MRN [id number]) as of 11/30/2020 09:22  Ref. Range 11/29/2020 08:09 11/29/2020 11:29 11/29/2020 16:47 11/29/2020 21:03 11/30/2020 07:28  Glucose-Capillary Latest Ref Range: 70 - 99 mg/dL 157 (H) 193 (H) 185 (H) 237 (H) 254 (H)   Review of Glycemic Control  Diabetes history:DM2 Outpatient Diabetes medications:OmniPod insulin pump with Novolog Current orders for Inpatient glycemic control:Levemir 25 units QHS, Novolog 0-9 units TID with meals, Novolog 0-5 units QHS, Novolog 4 units TID with meals   Inpatient Diabetes Program Recommendations:    Insulin: Please consider increasing Levemir to 30 units QHS.  Modena Nunnery, RN, MSN, CDE Diabetes Coordinator Inpatient Diabetes Program [phone] (Team Pager from 8am to 5pm)

## 2020-11-30 NOTE — Discharge Summary (Addendum)
Physician Discharge Summary  Hendrix Yurkovich BRA:[id number] DOB: [birth year] DOA: 11/26/2020  PCP: Merrilee Seashore, MD  Admit date: 11/26/2020 Discharge date: 11/30/2020  Admitted From: Home Disposition:  Home with Home Health RN  Recommendations for Outpatient Follow-up:  1. Follow up with PCP in 1-2 weeks 2. Follow up with General Surgeyr within 1-2 weeks 3. C/W Wound care per Gen Surgery Recc's 4. Please obtain CMP/CBC, Mag, Phos in one week 5. Please follow up on the following pending results: Follow-up on the final gram culture and stain results given that he had gram-positive cocci  Home Health: Yes  Equipment/Devices: Cane    Discharge Condition: Stable  CODE STATUS: FULL CODE Diet recommendation: Heart Healthy Carb Modified Diet   Brief/Interim Summary: The patient is a 75 yo overweigh AAM with a PMH significant for but not limited too T2DM, Prostate Cancer, HTN, HLD, GERD, Chronic Calcified Pancreatitis as well as other comorbidities who presented with CC ofback pain. Patient has had chronic back pain but that has and has not thought much of it but reportedly 2 days ago noticed a bump in his perineum. Reports the bump in his perineum gradually became larger and more painful. He describes a pressure and describes the pain as sharp and constant last 2 days and states is progressively gotten worse. He states it has not affected his bowel movements but he does have fecal incontinence approximately 25 daily and has been having this for years. He has not noticed any drainage or bleeding from his bowel. He also reports that he has a stoma that is "filling back up". States that this is been aspirated twice with both UA 200 mL at a time. In the ED he is found to be septic with a temperature of 102.2, respirations were 29 and heart rate of 103. Leukocytosis was 14.6. He was admitted for severe sepsis in the setting of 26 cubic cm of perineal abscess with intersphinteric and subacute  components with possibility of progressive fasciitis.  General surgery was consulted and patient was taken for incision and drainage of his perineal abscess with Penrose drain placement x2 and seton placement to the fistula tract on 11/27/2020 and he is is postoperative day 1.  His intraoperative cultures are showing few gram-negative rods and few gram-positive cocci. General surgery recommends sitz bath's.  Now patient is hospitalization has been complicated by hypotension so his antihypertensives have been discontinued.  He subsequently improved further and general surgery evaluated and recommending discharging home.  His blood pressure remained stable now and he was fluid rehydrated.  Labs are all normal his renal function is improved.  His sepsis physiology is improved as well.  General surgery to follow-up with the patient in outpatient setting and patient feels better today.  All questions were answered to the patient's satisfaction.  Discharge Diagnoses:  Principal Problem:   Peri-rectal abscess  Severe sepsis present on admission secondary to perirectal abscess;concern for Fournier's gangrene ruled out -Patient met SIRS criteria on admission with a temperature of 102.7, pulse rate of 103, respiratory rate of 29, and a WBC of 14.6  -WBC is improved to 12.6 after a peak of 17.8 -Patient's lactic acid level was 2.0 on admission does characterizing severe sepsis -General surgery has been consulted and plan to take the patient to the OR for incision and drainage -Patient received 4 L boluses of the ED of lactated Ringer's and is now on maintenance IV fluids with normal saline at 100 MLS per hour but will stop  -  Pressures remain on the softer side so we will hold his antihypertensives for now including his lisinopril and amlodipine -Urinalysis on admission showed moderate hemoglobin and 20 ketones but negative leukocytes, negative nitrites and no bacteria seen -Blood culture pending and Urine Cx  Negative -CT scan of the abdomen pelvis done and showed"26 cubic cm left perianal abscess with intersphincteric and subcutaneous components. There is also edema tracking along the perineum, and very careful clinical surveillance to exclude the possibility of a progressive fasciitis/Fournier's gangrene is recommended. Substantial enlargement in the simple appearing cystic lesion along the right groin region, currently 200 cubic cm and previously 12 cubic cm. This could represent a seroma, lymphangioma, or ganglion cyst. Subtle sclerosis in the left iliac bone compared to the right. Although the findings are subtle, strictly speaking I cannot exclude prostate metastatic lesion, and correlation with PSA level and/or bone scan would be suggested. 4. Other imaging findings of potential clinical significance: Mild cardiomegaly. Coronary atherosclerosis. Probable hepatic steatosis. Chronic calcific pancreatitis. Renal sinus lipomatosis. Aortic Atherosclerosis (ICD10-I70.0). Seed implants in the prostate gland." -Continue IV antibiotic coverage with IV Zosyn for now every 8 hours  -Continue supportive care and continue with antiemetics with ondansetron 4 mg p.o./IV every 6 hours as needed nausea -Intraoperative CX done and Sowed Few GPC and Few GNR; GNR turned out to pe Pan-sensitive E Coli -We will also continue pain control with IV morphine 2 mg every 2 hours as needed severe pain, p.o. oxycodone IR 5 mg every 4 hours as needed for moderate pain as well as acetaminophen 650 mg p.o./RC every 6 as needed for mild pain or fever; discharged with pain control -General surgery Flushed Penrose and he did well overall. Surgery recommending RN flushing Penrose  Each shift; general surgery saw the patient out of/ -Follow Cx results and was pansensitive E. coli: Have changed to p.o. Augmentin for 8 more days for total of 10 days -PT/OT recommend Supervision and no PT follow up; general surgery recommending sending the  patient home with the RN for wound care -Follow-up with surgery in 1 to 2 weeks.  Hyponatremia -Mild and improved -Patient sodium is now stable at 136 -Continue to monitor and trend and repeat CMP within 1 week  Hypokalemia -Patient's potassium was 3.3 to -Replete with po KCl 40 mEQ BID  -Mag Level was 1.7 and was given a dose of p.o. mag oxide one-time dose of 800 mg -Continue monitor and trend and replete as necessary -Repeat CMP within 1 week  AKI, improving Metabolic acidosis -Presented with a BUNs/creatinine of 31/1.67; baseline creatinine of 1.10 -Likely was prerenal and Now BUN/Cr had improved to 21/1.34 -> 20/1.48 -> 18/1.27 and is 11/1.09 and stable -Acidosis is improved and his CO2 24, anion gap of 6, chloride level 106 -Was getting normal saline at 100 MLS per hour but will now stop given advancement of Diet  -Avoid nephrotoxic medications, contrast dyes, hypotension renally dose medications -Blood pressure was on the softer side so we will hold his amlodipine and lisinopril for now -Repeat CMP within 1 week  Hypophosphatemia -Patient's Phos Level was 2.4 -Replete with po K Phos Neutral 500 mg x1 -Continue to Monitor and Replete as Necessary -Repeat Phos Level in the AM   Hyperbilirubinemia -Patient is to bili went from 1.2 -> 1.6 -> 1.2 -> 0.9 and today 0.8 -Likely reactive we will continue to monitor and trend -Repeat CMP within 1 week  Hyperlipidemia -Patient likely statin intolerant and continuesEvolocumab SOSY 140 mg sq  [id number]  PerirectalAbscess -As above; General Surgeryconsulted and plans to take the patient forI&D on 11/27/20 -As above.  Patient is stable to be discharged and follow-up with PCP and neurosurgery outpatient setting  Chronic Pancreatitis With Chronic Diarrhea -Start Creon and send the patient home with a Creon prescription -Likely has Fecal Incontinence from Prior Surgeries -Continue to Monitor   Mild protein calorie  malnutrition -Patient is currently n.p.o. for procedure tomorrow, consider adding protein shakes when patient is able to tolerate p.o. -Will consult Nutritionist for further evaluation and recommendations -Dietitian is recommending increasing nutrient needs and given the patient protein content in foods -Supplements written for at home  [place] -Continue long-acting insulin and short acting sliding scale with Diabetes Education Recc's. Will order Novolog 4 units TIDwm for Meal Coverage  -DC home insulin pumpwhile hospitalized -Last hemoglobin A1c was documented in her system was 7.6 -Repeat hemoglobin A1c while patient is hospitalized here -Will consult Diabetes Education coordinator for further blood sugar assistance and management  -CBG's Ranging from 125-254 -Resume home insulin at discharge with the pump  History of prostate cancer -Diagnosed in August 02, 2017 as stage I TC Gleason 3+4 -PSA at that time was 5.07 -He is status post radioactive seed implants on 11/29/2017 -We will check PSA again given findings on CT scan as aboveas they showed "Subtle sclerosis in the left iliac bone compared to the right. Although the findings are subtle, strictly speaking I cannot exclude prostate metastatic lesion, and correlation with PSA level and/or bone scan would be suggested." -PSA was stable at 0.12 -Follow-up as an outpatient   Discharge Instructions  Discharge Instructions    Call MD for:  difficulty breathing, headache or visual disturbances   Complete by: As directed    Call MD for:  extreme fatigue   Complete by: As directed    Call MD for:  hives   Complete by: As directed    Call MD for:  persistant dizziness or light-headedness   Complete by: As directed    Call MD for:  persistant nausea and vomiting   Complete by: As directed    Call MD for:  redness, tenderness, or signs of infection (pain, swelling, redness, odor or green/yellow discharge around  incision site)   Complete by: As directed    Call MD for:  severe uncontrolled pain   Complete by: As directed    Call MD for:  temperature >100.4   Complete by: As directed    Diet - low sodium heart healthy   Complete by: As directed    Diet Carb Modified   Complete by: As directed    Discharge instructions   Complete by: As directed    You were cared for by a hospitalist during your hospital stay. If you have any questions about your discharge medications or the care you received while you were in the hospital after you are discharged, you can call the unit and ask to speak with the hospitalist on call if the hospitalist that took care of you is not available. Once you are discharged, your primary care physician will handle any further medical issues. Please note that NO REFILLS for any discharge medications will be authorized once you are discharged, as it is imperative that you return to your primary care physician (or establish a relationship with a primary care physician if you do not have one) for your aftercare needs so that they can reassess your need for medications and monitor your lab values.  Follow up with PCP and General Surgery as an outpatient. Take all medications as prescribed. If symptoms change or worsen please return to the ED for evaluation   Discharge wound care:   Complete by: As directed    Saline flush to the penrose on left buttock (inside penrose and around), use 4 flushes   For home use only DME Cane   Complete by: As directed    Increase activity slowly   Complete by: As directed      Allergies as of 11/30/2020      Reactions   Atorvastatin Other (See Comments)   Reports elevated liver enzymes.   Other Other (See Comments)   Positive allergy test for peanuts and almonds   Shellfish Allergy Other (See Comments)   Positive allergy test   Toradol [ketorolac Tromethamine] Other (See Comments)   Pt has chronic calcific pancreatitis   Adhesive [tape] Rash    Tegaderm       Medication List    STOP taking these medications   hydrochlorothiazide 12.5 MG capsule Commonly known as: MICROZIDE     TAKE these medications   (feeding supplement) PROSource Plus liquid Take 30 mLs by mouth 2 (two) times daily between meals.   feeding supplement Liqd Take 237 mLs by mouth 2 (two) times daily between meals.   acetaminophen 325 MG tablet Commonly known as: TYLENOL Take 2 tablets (650 mg total) by mouth every 6 (six) hours as needed for mild pain (or Fever >/= 101).   amLODipine 10 MG tablet Commonly known as: NORVASC Take 10 mg by mouth every morning.   amoxicillin-clavulanate 875-125 MG tablet Commonly known as: Augmentin Take 1 tablet by mouth every 12 (twelve) hours for 7 days.   aspirin 325 MG tablet Take 325 mg by mouth 2 (two) times a week. And takes as needed for headaches   carboxymethylcellulose 0.5 % Soln Commonly known as: REFRESH PLUS Apply 1 drop to eye daily as needed (FOR EYE IRRITATION).   celecoxib 200 MG capsule Commonly known as: CELEBREX Take 200 mg by mouth daily.   fosinopril 10 MG tablet Commonly known as: MONOPRIL Take 10 mg by mouth daily.   insulin aspart 100 UNIT/ML injection Commonly known as: novoLOG Inject 30-45 Units into the skin daily as needed for high blood sugar. Via pump   lipase/protease/amylase 36000 UNITS Cpep capsule Commonly known as: CREON Take 1 capsule (36,000 Units total) by mouth 3 (three) times daily before meals.   ondansetron 4 MG tablet Commonly known as: ZOFRAN Take 1 tablet (4 mg total) by mouth every 6 (six) hours as needed for nausea.   oxyCODONE 5 MG immediate release tablet Commonly known as: Oxy IR/ROXICODONE Take 1-2 tablets (5-10 mg total) by mouth every 4 (four) hours as needed for moderate pain.   pantoprazole 40 MG tablet Commonly known as: PROTONIX Take 40 mg by mouth every morning.   Repatha 140 MG/ML Sosy Generic drug: Evolocumab Inject 140 mg into the  skin every 14 (fourteen) days.   tamsulosin 0.4 MG Caps capsule Commonly known as: FLOMAX Take 1 capsule (0.4 mg total) by mouth daily as needed. For urinary urgency after prostate radiation. What changed:   when to take this  additional instructions   traZODone 50 MG tablet Commonly known as: DESYREL Take 50 mg by mouth at bedtime.            Durable Medical Equipment  (From admission, onward)         Start  Ordered   11/30/20 0000  For home use only DME Cane        11/30/20 1129           Discharge Care Instructions  (From admission, onward)         Start     Ordered   11/30/20 0000  Discharge wound care:       Comments: Saline flush to the penrose on left buttock (inside penrose and around), use 4 flushes   11/30/20 1129          Follow-up Information    Virl Cagey, MD Follow up on 12/05/2020.   Specialty: General Surgery Why: wound check, perianal fistula and abscess  Contact information: [id number] [place] [facility] [address] [phone]              Allergies  Allergen Reactions  . Atorvastatin Other (See Comments)    Reports elevated liver enzymes.  . Other Other (See Comments)    Positive allergy test for peanuts and almonds  . Shellfish Allergy Other (See Comments)    Positive allergy test  . Toradol [Ketorolac Tromethamine] Other (See Comments)    Pt has chronic calcific pancreatitis  . Adhesive [Tape] Rash    Tegaderm     Consultations:  General surgery  Procedures/Studies: CT ABDOMEN PELVIS W CONTRAST  Result Date: 11/26/2020 CLINICAL DATA:  Abdominal abscess along the peroneal/rectal region. Chronic pancreatitis. History of prostate and colon cancer EXAM: CT ABDOMEN AND PELVIS WITH CONTRAST TECHNIQUE: Multidetector CT imaging of the abdomen and pelvis was performed using the standard protocol following bolus administration of intravenous contrast. CONTRAST:  21m OMNIPAQUE IOHEXOL 300 MG/ML  SOLN COMPARISON:   MRI pelvis 02/26/2020 and CT abdomen/pelvis 03/14/2015 FINDINGS: Lower chest: Gynecomastia. Mild cardiomegaly. Right coronary artery atherosclerotic vascular calcification. Dependent subsegmental atelectasis in the posterior basal segments of both lower lobes. Hepatobiliary: Probable hepatic steatosis. Mildly contracted gallbladder. Streak artifact from electronic device posterior to the patient partially obscures the upper abdomen. Pancreas: Coarse calcifications in the pancreatic parenchyma compatible with chronic calcific pancreatitis. Spleen: Unremarkable Adrenals/Urinary Tract: Mild bilateral renal sinus lipomatosis. Urinary bladder unremarkable. No urinary tract calculi are identified. The adrenal glands appear normal. 0.6 cm left kidney upper pole hypodense lesion is likely a cyst although technically too small to characterize, unchanged from 2016. Stomach/Bowel: Anastomotic staple line in the distal sigmoid colon. Normal appendix. Left eccentric intersphincteric perianal abscess along the left lower buttock likely extending into the subcutaneous tissues, measuring about 4.1 by 2.7 by 4.4 cm (volume = 26 cm^3), but also with a linear 3.2 cm extension cephalad along the posterior margin of the external sphincter. There is adjacent subcutaneous edema along the perineum. Vascular/Lymphatic: Aortoiliac atherosclerotic vascular disease. No pathologic adenopathy identified. Reproductive: Brachytherapy seed implants in the small prostate gland. Other: Chronic 7.1 by 5.9 by 9.2 cm (volume = 200 cm^3) simple cystic lesion along the right groin, substantially increased in size from 03/14/2015 where this measured 3.0 by 1.9 by 3.9 cm (volume = 12 cm^3). Possibilities include seroma, lymphangioma, or ganglion cyst. Musculoskeletal: Bridging spurring of the left sacroiliac joint. Subtle sclerosis in the left iliac bone for example on image 62 series 2, increased from prior. Degenerative endplate sclerosis at the L3-4  level. IMPRESSION: 1. 26 cubic cm left perianal abscess with intersphincteric and subcutaneous components. There is also edema tracking along the perineum, and very careful clinical surveillance to exclude the possibility of a progressive fasciitis/Fournier's gangrene is recommended. 2. Substantial enlargement in the  simple appearing cystic lesion along the right groin region, currently 200 cubic cm and previously 12 cubic cm. This could represent a seroma, lymphangioma, or ganglion cyst. 3. Subtle sclerosis in the left iliac bone compared to the right. Although the findings are subtle, strictly speaking I cannot exclude prostate metastatic lesion, and correlation with PSA level and/or bone scan would be suggested. 4. Other imaging findings of potential clinical significance: Mild cardiomegaly. Coronary atherosclerosis. Probable hepatic steatosis. Chronic calcific pancreatitis. Renal sinus lipomatosis. Aortic Atherosclerosis (ICD10-I70.0). Seed implants in the prostate gland. Electronically Signed   By: Van Clines M.D.   On: 11/26/2020 20:33   DG Chest Port 1 View  Result Date: 11/26/2020 CLINICAL DATA:  Fever for 4 days, sepsis EXAM: PORTABLE CHEST 1 VIEW COMPARISON:  10/06/2017 FINDINGS: 2 frontal views of the chest demonstrate an unremarkable cardiac silhouette. No acute airspace disease, effusion, or pneumothorax. No acute bony abnormalities. IMPRESSION: 1. No acute intrathoracic process. Electronically Signed   By: Randa Ngo M.D.   On: 11/26/2020 18:45     Subjective: Seen and examined at bedside and the patient was doing much better today and felt better.  States he slept well.  Denies much pain and states his pain is fairly well controlled.  No nausea or vomiting.  Denies any concerns or complaints and states his diarrhea has improved significantly.  No other concerns at this time and all questions were answered to his satisfaction and his wife satisfaction.  Discharge Exam: Vitals:    11/29/20 2106 11/30/20 0613  BP: 115/72 100/69  Pulse: 81 75  Resp: 20 20  Temp: 99.1 F (37.3 C) 98.2 F (36.8 C)  SpO2: 96% 91%   Vitals:   11/29/20 0425 11/29/20 1712 11/29/20 2106 11/30/20 0613  BP: 100/64 122/76 115/72 100/69  Pulse: 65 67 81 75  Resp: '20  20 20  ' Temp: 98.2 F (36.8 C) 98.3 F (36.8 C) 99.1 F (37.3 C) 98.2 F (36.8 C)  TempSrc:  Oral Oral Oral  SpO2: 99% 96% 96% 91%  Weight:      Height:       General: Pt is alert, awake, not in acute distress Cardiovascular: RRR, S1/S2 +, no rubs, no gallops Respiratory: Diminished bilaterally, no wheezing, no rhonchi; unlabored breathing Abdominal: Soft, NT, distended, bowel sounds + Extremities: Slight extremity edema, no cyanosis  The results of significant diagnostics from this hospitalization (including imaging, microbiology, ancillary and laboratory) are listed below for reference.    Microbiology: Recent Results (from the past 240 hour(s))  Urine culture     Status: None   Collection Time: 11/26/20  5:48 PM   Specimen: In/Out Cath Urine  Result Value Ref Range Status   Specimen Description   Final    IN/OUT CATH URINE Performed at [facility], [address], [place], [place] [address]    Special Requests   Final    NONE Performed at [facility], [address], [place], [place] [address]    Culture   Final    NO GROWTH Performed at [facility], [place] [address], [place], [place] [address]    Report Status 11/28/2020 FINAL  Final  Resp Panel by RT-PCR (Flu A&B, Covid)     Status: None   Collection Time: 11/26/20  5:55 PM   Specimen: Nasopharyngeal(NP) swabs in vial transport medium  Result Value Ref Range Status   SARS Coronavirus 2 by RT PCR NEGATIVE NEGATIVE Final    Comment: (NOTE) SARS-CoV-2 target nucleic  acids are NOT DETECTED.  The SARS-CoV-2 RNA is generally detectable in upper respiratory specimens during the acute phase of infection. The lowest concentration of SARS-CoV-2 viral  copies this assay can detect is 138 copies/mL. A negative result does not preclude SARS-Cov-2 infection and should not be used as the sole basis for treatment or other patient management decisions. A negative result may occur with  improper specimen collection/handling, submission of specimen other than nasopharyngeal swab, presence of viral mutation(s) within the areas targeted by this assay, and inadequate number of viral copies(<138 copies/mL). A negative result must be combined with clinical observations, patient history, and epidemiological information. The expected result is Negative.  Fact Sheet for Patients:  EntrepreneurPulse.com.au  Fact Sheet for Healthcare Providers:  IncredibleEmployment.be  This test is no t yet approved or cleared by the [place] FDA and  has been authorized for detection and/or diagnosis of SARS-CoV-2 by FDA under an Emergency Use Authorization (EUA). This EUA will remain  in effect (meaning this test can be used) for the duration of the COVID-19 declaration under Section 564(b)(1) of the Act, 21 U.S.C.section 360bbb-3(b)(1), unless the authorization is terminated  or revoked sooner.       Influenza A by PCR NEGATIVE NEGATIVE Final   Influenza B by PCR NEGATIVE NEGATIVE Final    Comment: (NOTE) The Xpert Xpress SARS-CoV-2/FLU/RSV plus assay is intended as an aid in the diagnosis of influenza from Nasopharyngeal swab specimens and should not be used as a sole basis for treatment. Nasal washings and aspirates are unacceptable for Xpert Xpress SARS-CoV-2/FLU/RSV testing.  Fact Sheet for Patients: EntrepreneurPulse.com.au  Fact Sheet for Healthcare Providers: IncredibleEmployment.be  This test is not yet approved or cleared by the [place] FDA and has been authorized for detection and/or diagnosis of SARS-CoV-2 by FDA under an Emergency Use Authorization (EUA). This  EUA will remain in effect (meaning this test can be used) for the duration of the COVID-19 declaration under Section 564(b)(1) of the Act, 21 U.S.C. section 360bbb-3(b)(1), unless the authorization is terminated or revoked.  Performed at [facility], [address] [place], [place] [address]   Blood culture (routine single)     Status: None (Preliminary result)   Collection Time: 11/26/20  6:14 PM   Specimen: BLOOD  Result Value Ref Range Status   Specimen Description BLOOD RIGHT ANTECUBITAL  Final   Special Requests   Final    BOTTLES DRAWN AEROBIC AND ANAEROBIC Blood Culture adequate volume   Culture   Final    NO GROWTH 4 DAYS Performed at [facility], [address] [place], [place] [address]    Report Status PENDING  Incomplete  Aerobic/Anaerobic Culture w Gram Stain (surgical/deep wound)     Status: None (Preliminary result)   Collection Time: 11/27/20  1:16 PM   Specimen: Abscess  Result Value Ref Range Status   Specimen Description   Final    ABSCESS Performed at [facility], [address] [place], [place] [address]    Special Requests   Final    NONE Performed at [facility], [address], [place], [place] [address]    Gram Stain   Final    MODERATE WBC PRESENT,BOTH PMN AND MONONUCLEAR FEW GRAM POSITIVE COCCI FEW GRAM NEGATIVE RODS Performed at [facility], [place] [address], [place], [place] [address]    Culture   Final    FEW ESCHERICHIA COLI CULTURE REINCUBATED FOR BETTER GROWTH NO ANAEROBES ISOLATED; CULTURE IN PROGRESS FOR 5 DAYS  Report Status PENDING  Incomplete   Organism ID, Bacteria ESCHERICHIA COLI  Final      Susceptibility   Escherichia coli - MIC*    AMPICILLIN <=2 SENSITIVE Sensitive     CEFAZOLIN <=4 SENSITIVE Sensitive     CEFEPIME <=0.12 SENSITIVE Sensitive     CEFTAZIDIME <=1 SENSITIVE Sensitive     CEFTRIAXONE <=0.25 SENSITIVE Sensitive     CIPROFLOXACIN <=0.25 SENSITIVE Sensitive     GENTAMICIN <=1 SENSITIVE Sensitive      IMIPENEM <=0.25 SENSITIVE Sensitive     TRIMETH/SULFA <=20 SENSITIVE Sensitive     AMPICILLIN/SULBACTAM <=2 SENSITIVE Sensitive     PIP/TAZO <=4 SENSITIVE Sensitive     * FEW ESCHERICHIA COLI    Labs: BNP (last 3 results) No results for input(s): BNP in the last 8760 hours. Basic Metabolic Panel: Recent Labs  Lab 11/26/20 1814 11/26/20 2225 11/27/20 0353 11/28/20 0541 11/29/20 0551 11/30/20 0638  NA 133*  --  133* 140 138 136  K 2.8*  --  3.3* 2.7* 3.4* 3.3*  CL 95*  --  99 109 108 106  CO2 25  --  21* '22 24 24  ' GLUCOSE 185*  --  240* 121* 196* 277*  BUN 31*  --  '21 20 18 11  ' CREATININE 1.67*  --  1.34* 1.48* 1.27* 1.09  CALCIUM 8.6*  --  8.1* 7.4* 8.3* 8.7*  MG  --  2.1 1.8 1.8 2.1 1.7  PHOS  --   --   --  2.5 2.4* 2.6   Liver Function Tests: Recent Labs  Lab 11/26/20 1814 11/27/20 0353 11/28/20 0541 11/29/20 0551 11/30/20 0638  AST 74* 60* 57* 40 32  ALT 35 32 30 32 28  ALKPHOS 84 68 71 87 93  BILITOT 1.2 1.6* 1.2 0.9 0.8  PROT 7.0 5.4* 4.8* 5.3* 5.4*  ALBUMIN 2.9* 2.2* 1.9* 2.1* 2.1*   No results for input(s): LIPASE, AMYLASE in the last 168 hours. No results for input(s): AMMONIA in the last 168 hours. CBC: Recent Labs  Lab 11/26/20 1814 11/27/20 0353 11/28/20 0541 11/29/20 0551 11/30/20 0638  WBC 14.6* 17.8* 16.0* 12.6* 9.8  NEUTROABS 11.8*  --  12.7* 9.2* 6.9  HGB 16.4 14.5 12.5* 13.3 13.6  HCT 49.0 43.6 38.2* 40.4 40.5  MCV 89.6 90.6 91.8 90.4 90.4  PLT 200 189 188 259 300   Cardiac Enzymes: No results for input(s): CKTOTAL, CKMB, CKMBINDEX, TROPONINI in the last 168 hours. BNP: Invalid input(s): POCBNP CBG: Recent Labs  Lab 11/29/20 1129 11/29/20 1647 11/29/20 2103 11/30/20 0728 11/30/20 1126  GLUCAP 193* 185* 237* 254* 125*   D-Dimer No results for input(s): DDIMER in the last 72 hours. Hgb A1c Recent Labs    11/28/20 0541  HGBA1C 7.5*   Lipid Profile No results for input(s): CHOL, HDL, LDLCALC, TRIG, CHOLHDL, LDLDIRECT in  the last 72 hours. Thyroid function studies No results for input(s): TSH, T4TOTAL, T3FREE, THYROIDAB in the last 72 hours.  Invalid input(s): FREET3 Anemia work up No results for input(s): VITAMINB12, FOLATE, FERRITIN, TIBC, IRON, RETICCTPCT in the last 72 hours. Urinalysis    Component Value Date/Time   COLORURINE YELLOW 11/26/2020 [place] 11/26/2020 1748   LABSPEC 1.019 11/26/2020 1748   PHURINE 5.0 11/26/2020 1748   GLUCOSEU NEGATIVE 11/26/2020 1748   HGBUR MODERATE (A) 11/26/2020 [place] 11/26/2020 1748   KETONESUR 20 (A) 11/26/2020 1748   PROTEINUR 30 (A) 11/26/2020 1748   NITRITE NEGATIVE  11/26/2020 [place] 11/26/2020 1748   Sepsis Labs Invalid input(s): PROCALCITONIN,  WBC,  LACTICIDVEN Microbiology Recent Results (from the past 240 hour(s))  Urine culture     Status: None   Collection Time: 11/26/20  5:48 PM   Specimen: In/Out Cath Urine  Result Value Ref Range Status   Specimen Description   Final    IN/OUT CATH URINE Performed at [facility], [address], [place], [place] [address]    Special Requests   Final    NONE Performed at [facility], [address] [place], [place] [address]    Culture   Final    NO GROWTH Performed at [facility], [place] [address], [place], [place] [address]    Report Status 11/28/2020 FINAL  Final  Resp Panel by RT-PCR (Flu A&B, Covid)     Status: None   Collection Time: 11/26/20  5:55 PM   Specimen: Nasopharyngeal(NP) swabs in vial transport medium  Result Value Ref Range Status   SARS Coronavirus 2 by RT PCR NEGATIVE NEGATIVE Final    Comment: (NOTE) SARS-CoV-2 target nucleic acids are NOT DETECTED.  The SARS-CoV-2 RNA is generally detectable in upper respiratory specimens during the acute phase of infection. The lowest concentration of SARS-CoV-2 viral copies this assay can detect is 138 copies/mL. A negative result does not preclude SARS-Cov-2 infection  and should not be used as the sole basis for treatment or other patient management decisions. A negative result may occur with  improper specimen collection/handling, submission of specimen other than nasopharyngeal swab, presence of viral mutation(s) within the areas targeted by this assay, and inadequate number of viral copies(<138 copies/mL). A negative result must be combined with clinical observations, patient history, and epidemiological information. The expected result is Negative.  Fact Sheet for Patients:  EntrepreneurPulse.com.au  Fact Sheet for Healthcare Providers:  IncredibleEmployment.be  This test is no t yet approved or cleared by the [place] FDA and  has been authorized for detection and/or diagnosis of SARS-CoV-2 by FDA under an Emergency Use Authorization (EUA). This EUA will remain  in effect (meaning this test can be used) for the duration of the COVID-19 declaration under Section 564(b)(1) of the Act, 21 U.S.C.section 360bbb-3(b)(1), unless the authorization is terminated  or revoked sooner.       Influenza A by PCR NEGATIVE NEGATIVE Final   Influenza B by PCR NEGATIVE NEGATIVE Final    Comment: (NOTE) The Xpert Xpress SARS-CoV-2/FLU/RSV plus assay is intended as an aid in the diagnosis of influenza from Nasopharyngeal swab specimens and should not be used as a sole basis for treatment. Nasal washings and aspirates are unacceptable for Xpert Xpress SARS-CoV-2/FLU/RSV testing.  Fact Sheet for Patients: EntrepreneurPulse.com.au  Fact Sheet for Healthcare Providers: IncredibleEmployment.be  This test is not yet approved or cleared by the [place] FDA and has been authorized for detection and/or diagnosis of SARS-CoV-2 by FDA under an Emergency Use Authorization (EUA). This EUA will remain in effect (meaning this test can be used) for the duration of the COVID-19 declaration  under Section 564(b)(1) of the Act, 21 U.S.C. section 360bbb-3(b)(1), unless the authorization is terminated or revoked.  Performed at [facility], [address] [place], [place] [address]   Blood culture (routine single)     Status: None (Preliminary result)   Collection Time: 11/26/20  6:14 PM   Specimen: BLOOD  Result Value Ref Range Status   Specimen Description BLOOD RIGHT ANTECUBITAL  Final  Special Requests   Final    BOTTLES DRAWN AEROBIC AND ANAEROBIC Blood Culture adequate volume   Culture   Final    NO GROWTH 4 DAYS Performed at [facility], [address] [place], [place] [address]    Report Status PENDING  Incomplete  Aerobic/Anaerobic Culture w Gram Stain (surgical/deep wound)     Status: None (Preliminary result)   Collection Time: 11/27/20  1:16 PM   Specimen: Abscess  Result Value Ref Range Status   Specimen Description   Final    ABSCESS Performed at [facility], [address] [place], [place] [address]    Special Requests   Final    NONE Performed at [facility], [address] [place], [place] [address]    Gram Stain   Final    MODERATE WBC PRESENT,BOTH PMN AND MONONUCLEAR FEW GRAM POSITIVE COCCI FEW GRAM NEGATIVE RODS Performed at [facility], [place] [address] [place], [place] [address]    Culture   Final    FEW ESCHERICHIA COLI CULTURE REINCUBATED FOR BETTER GROWTH NO ANAEROBES ISOLATED; CULTURE IN PROGRESS FOR 5 DAYS    Report Status PENDING  Incomplete   Organism ID, Bacteria ESCHERICHIA COLI  Final      Susceptibility   Escherichia coli - MIC*    AMPICILLIN <=2 SENSITIVE Sensitive     CEFAZOLIN <=4 SENSITIVE Sensitive     CEFEPIME <=0.12 SENSITIVE Sensitive     CEFTAZIDIME <=1 SENSITIVE Sensitive     CEFTRIAXONE <=0.25 SENSITIVE Sensitive     CIPROFLOXACIN <=0.25 SENSITIVE Sensitive     GENTAMICIN <=1 SENSITIVE Sensitive     IMIPENEM <=0.25 SENSITIVE Sensitive     TRIMETH/SULFA <=20 SENSITIVE Sensitive      AMPICILLIN/SULBACTAM <=2 SENSITIVE Sensitive     PIP/TAZO <=4 SENSITIVE Sensitive     * FEW ESCHERICHIA COLI   Time coordinating discharge: 35 minutes  SIGNED:  Kerney Elbe, DO [facility] 11/30/2020, 4:46 PM Pager is on AMION  If 7PM-7AM, please contact night-coverage [email or web address]

## 2020-11-30 NOTE — Progress Notes (Signed)
[place] today with Augmentin. Family knows the wound care instructions with flushing twice daily with 40cc.  [facility] RN ordered. Supplies sent home with family.  Wound softer and drains in place, drainage.  Will see in the office 12/05/20.  Curlene Labrum, MD [facility] [address] [place], [place] [address] [phone] (office)

## 2020-11-30 NOTE — Discharge Instructions (Signed)
Post Operative Instructions after Perirectal abscess, Fistula drainage with seton -Keep your stools soft and have a BM daily.  -If you continue to be incontinent you can try some Imodium 2-4 mg as needed. -Take Sitz Baths (warm water baths) after every BM and when having discomfort.  -You can purchase at Sitz bath for the toilet at the pharmacy  -Take Ibuprofen and Tylenol alternating and the Narcotic pain medication for breakthrough pain. -Your seton (or red catheter) can cause some discomfort but this should improve. Move the seton around and make sure it slides easily everyday. -FLUSH YOUR PENROSE (white catheters) twice daily with saline flushes X 4 (40cc total) (you can reuse the syringes, just wash with soap and water as needed) -You may shower and soap and water can get on the catheters, just pat them dry after.  -Wear a pad for leakage and drainage.  -Some bleeding is normal, but if you have excessive bleeding, fevers, chills, or more pain than prior, call or go to the ED.    Anal Fistula  An anal fistula is a hole that develops between the bowel and the skin near the anus. The anus allows stool (feces) to leave the body. The anus has many tiny glands that make lubricating fluid. Sometimes, these glands become plugged and infected. This can cause a fluid-filled pocket (abscess) to form. An anal fistula often occurs when an abscess becomes infected and then develops into a hole between the bowel and the skin. What are the causes? In most cases, an anal fistula is caused by a past or current buildup of pus around the anus (anal abscess). Other causes include:  A complication of surgery.  Injury to the rectum or the area around it.  Using high-energy beams (radiation) to treat the area around the rectum. What increases the risk? You are more likely to develop this condition if you have certain medical conditions or diseases, including:  Chronic inflammatory bowel disease, such as  Crohn's disease or ulcerative colitis.  Colon cancer or rectal cancer.  Diverticular disease, such as diverticulitis.  A sexually transmitted infection, or STI, such as gonorrhea, chlamydia, or syphilis.  An infection that is caused by HIV. What are the signs or symptoms? Symptoms of this condition include:  Throbbing or constant pain that may be worse while you are sitting.  Swelling or irritation around the anus.  Pus or blood from an opening near the anus.  Pain when passing stool.  Fever or chills. How is this diagnosed? This condition is diagnosed based on:  A physical exam. This may include: ? An exam to find the external opening of the fistula. ? An exam with a probe or scope to help locate the internal opening of the fistula. ? An exam of the rectum with a gloved hand (digital rectal exam).  Imaging tests that use dye to find the exact location and path of the fistula. Tests may include: ? X-rays. ? Ultrasound. ? CT scan. ? MRI.  Other tests to find the cause of the anal fistula. How is this treated? This condition is most commonly treated with surgery. The type of surgery that is used will depend on where the fistula is located and how complex the fistula is. Surgery may include:  A fistulotomy. The whole fistula is opened up, and the contents are drained to promote healing.  Seton placement. A silk string (seton) is placed into the fistula during a fistulotomy. This helps to drain any infection and promote  healing.  Advancement flap procedure. Tissue is removed from your rectum or the skin around the anus and attached to the opening of the fistula.  Bioprosthetic plug. A cone-shaped plug is made from your tissue and is used to block the opening of the fistula. Some anal fistulas do not require surgery. A nonsurgical treatment option involves injecting a fibrin glue to seal the fistula. You also may be prescribed an antibiotic medicine to treat any  infection. Follow these instructions at home: Medicines  Take over-the-counter and prescription medicines only as told by your health care provider.  If you were prescribed an antibiotic medicine, take it as told by your health care provider. Do not stop taking the antibiotic even if you start to feel better.  Use a stool softener or a laxative if told to do so by your health care provider. General instructions  Eat a high-fiber diet as told by your health care provider. This can help to prevent constipation.  Drink enough fluid to keep your urine pale yellow.  Take a warm sitz bath for 15-20 minutes, 3-4 times per day, or as told by your health care provider. Sitz baths can ease your pain and discomfort and help with healing.  Follow good hygiene to keep the anal area as clean and dry as possible. Use wet toilet paper or a moist towelette after each bowel movement.  Keep all follow-up visits as told by your health care provider. This is important.   Contact a health care provider if you have:  Increased pain that is not controlled with medicines.  New redness or swelling around the anal area.  New fluid, blood, or pus coming from the anal area.  Tenderness or warmth around the anal area. Get help right away if you have:  A fever.  Severe pain.  Chills or diarrhea.  Severe problems urinating or having a bowel movement. Summary  An anal fistula is a hole that develops between the bowel and the skin near the anus.  This condition is most often caused by a buildup of pus around the anus (anal abscess). Other causes include a complication of surgery, an injury to the rectum, or the use of radiation to treat the rectal area.  This condition is most commonly treated with surgery.  Follow your health care provider's instructions about taking medicines, eating and drinking, or taking sitz baths.  Call your health care provider if you have more pain, swelling, or blood. Get  help right away if you have fever, severe pain, or problems passing urine or stool. This information is not intended to replace advice given to you by your health care provider. Make sure you discuss any questions you have with your health care provider. Document Revised: 11/26/2017 Document Reviewed: 11/26/2017 Elsevier Patient Education  [place].   Anorectal Abscess An abscess is an infected area that contains a collection of pus. An anorectal abscess is an abscess that is near the opening of the anus or around the rectum. Without treatment, an anorectal abscess can become larger and cause other problems, such as a more serious body-wide infection or pain, especially during bowel movements. What are the causes? This condition is caused by plugged glands or an infection in one of these areas:  The anus.  The area between the anus and the scrotum in males or between the anus and the vagina in females (perineum). What increases the risk? The following factors may make you more likely to develop this condition:  Diabetes or inflammatory bowel disease.  Having a body defense system (immune system) that is weak.  Engaging in anal sex.  Having a sexually transmitted infection (STI).  Certain kinds of cancer, such as rectal carcinoma, leukemia, or lymphoma. What are the signs or symptoms? The main symptom of this condition is pain. The pain may be a throbbing pain that gets worse during bowel movements. Other symptoms include:  Swelling and redness in the area of the abscess. The redness may go beyond the abscess and appear as a red streak on the skin.  A visible, painful lump, or a lump that can be felt when touched.  Bleeding or pus-like discharge from the area.  Fever.  General weakness.  Constipation.  Diarrhea. How is this diagnosed? This condition is diagnosed based on your medical history and a physical exam of the affected area.  This may involve examining  the rectal area with a gloved hand (digital rectal exam).  Sometimes, the health care provider needs to look into the rectum using a probe, scope, or imaging test.  For women, it may require a careful vaginal exam. How is this treated? Treatment for this condition may include:  Incision and drainage surgery. This involves making an incision over the abscess to drain the pus.  Medicines, including antibiotic medicine, pain medicine, stool softeners, or laxatives. Follow these instructions at home: Medicines  Take over-the-counter and prescription medicines only as told by your health care provider.  If you were prescribed an antibiotic medicine, use it as told by your health care provider. Do not stop using the antibiotic even if you start to feel better.  Do not drive or use heavy machinery while taking prescription pain medicine. Wound care  Flush your catheters as instructed twice a day.   Check your incision area every day for signs of infection. Check for: ? More redness, swelling, or pain. ? More fluid or blood. ? Warmth. ? Pus or a bad smell.   Managing pain, stiffness, and swelling  Take a sitz bath 3-4 times a day and after bowel movements. This will help reduce pain and swelling.  To relieve pain, try sitting: ? On a heating pad with the setting on low. ? On an inflatable donut-shaped cushion.  If directed, put ice on the affected area: ? Put ice in a plastic bag. ? Place a towel between your skin and the bag. ? Leave the ice on for 20 minutes, 2-3 times a day.   General instructions  Follow any diet instructions given by your health care provider.  Keep all follow-up visits as told by your health care provider. This is important. Contact a health care provider if you have:  Bleeding from your incision.  Pain, swelling, or redness that does not improve or gets worse.  Trouble passing stool or urine.  Symptoms that return after treatment. Get help right  away if you:  Have problems moving or using your legs.  Have severe or increasing pain.  Have swelling in the affected area that suddenly gets worse.  Have a large increase in bleeding or passing of pus.  Develop chills or a fever. Summary  An anorectal abscess is an abscess that is near the opening of the anus or around the rectum. An abscess is an infected area that contains a collection of pus.  The main symptom of this condition is pain. It may be a throbbing pain that gets worse during bowel movements.  Treatment for an anorectal abscess  may include surgery to drain the pus from the abscess. Medicines and sitz baths may also be a part of your treatment plan. This information is not intended to replace advice given to you by your health care provider. Make sure you discuss any questions you have with your health care provider. Document Revised: 08/19/2017 Document Reviewed: 08/19/2017 Elsevier Patient Education  2021 [place].  How to Take a [employer] A sitz bath is a warm water bath that may be used to care for your rectum, genital area, or the area between your rectum and genitals (perineum). In a sitz bath, the water only comes up to your hips and covers your buttocks. A sitz bath may be done in a bathtub or with a portable sitz bath that fits over the toilet. Your health care provider may recommend a sitz bath to help:  Relieve pain and discomfort after delivering a baby.  Relieve pain and itching from hemorrhoids or anal fissures.  Relieve pain after certain surgeries.  Relax muscles that are sore or tight. How to take a sitz bath Take 3-4 sitz baths a day, or as many as told by your health care provider. Bathtub sitz bath To take a sitz bath in a bathtub: 1. Partially fill a bathtub with warm water. The water should be deep enough to cover your hips and buttocks when you are sitting in the tub. 2. Follow your health care provider's instructions if you are told to  put medicine in the water. 3. Sit in the water. Open the tub drain a little, and leave it open during your bath. 4. Turn on the warm water again, enough to replace the water that is draining out. Keep the water running throughout your bath. This helps keep the water at the right level and temperature. 5. Soak in the water for 15-20 minutes, or as long as told by your health care provider. 6. When you are done, be careful when you stand up. You may feel dizzy. 7. After the sitz bath, pat yourself dry. Do not rub your skin to dry it.   Over-the-toilet sitz bath To take a sitz bath with an over-the-toilet basin: 1. Follow the manufacturer's instructions. 2. Fill the basin with warm water. 3. Follow your health care provider's instructions if you were told to put medicine in the water. 4. Sit on the seat. Make sure the water covers your buttocks and perineum. 5. Soak in the water for 15-20 minutes, or as long as told by your health care provider. 6. After the sitz bath, pat yourself dry. Do not rub your skin to dry it. 7. Clean and dry the basin between uses. 8. Discard the basin if it cracks, or according to the manufacturer's instructions.   Contact a health care provider if:  Your pain or itching gets worse. Do not continue with sitz baths if your symptoms get worse.  You have new symptoms. Do not continue with sitz baths until you talk with your health care provider. Summary  A sitz bath is a warm water bath in which the water only comes up to your hips and covers your buttocks.  A sitz bath may help relieve pain and discomfort after delivering a baby. It also may help with pain and itching from hemorrhoids or anal fissures, or pain after certain surgeries. It can also help to relax muscles that are sore or tight.  Take 3-4 sitz baths a day, or as many as told by your health care  provider. Soak in the water for 15-20 minutes.  Do not continue with sitz baths if your symptoms get  worse. This information is not intended to replace advice given to you by your health care provider. Make sure you discuss any questions you have with your health care provider. Document Revised: 03/28/2020 Document Reviewed: 03/28/2020 Elsevier Patient Education  2021 [employer].

## 2020-12-01 LAB — CULTURE, BLOOD (SINGLE)
Culture: NO GROWTH
Special Requests: ADEQUATE

## 2020-12-02 LAB — AEROBIC/ANAEROBIC CULTURE W GRAM STAIN (SURGICAL/DEEP WOUND)

## 2020-12-05 ENCOUNTER — Encounter: Payer: Self-pay | Admitting: General Surgery

## 2020-12-05 ENCOUNTER — Encounter (INDEPENDENT_AMBULATORY_CARE_PROVIDER_SITE_OTHER): Payer: Medicare Other | Admitting: Ophthalmology

## 2020-12-05 ENCOUNTER — Other Ambulatory Visit: Payer: Self-pay

## 2020-12-05 ENCOUNTER — Ambulatory Visit (INDEPENDENT_AMBULATORY_CARE_PROVIDER_SITE_OTHER): Payer: Medicare Other | Admitting: General Surgery

## 2020-12-05 VITALS — BP 95/65 | HR 105 | Temp 98.3°F | Resp 12 | Ht 74.0 in | Wt 201.0 lb

## 2020-12-05 DIAGNOSIS — K610 Anal abscess: Secondary | ICD-10-CM

## 2020-12-05 MED ORDER — OXYCODONE HCL 5 MG PO TABS: 5.0000 mg | ORAL_TABLET | ORAL | 0 refills | Status: DC | PRN

## 2020-12-05 NOTE — Patient Instructions (Signed)
Continue drain care and flushing.

## 2020-12-05 NOTE — Progress Notes (Signed)
[facility] Note   HPI:  75 y.o. Male presents to clinic for post-op follow-up evaluation of his perianal abscess. He is still having discomfort. He is having his chronic incontinence.  Review of Systems:  No fevers Some drainage Sitz baths and saline flushes going well Regular incontinence  Back pain  All other review of systems: otherwise negative   Vital Signs:  BP 95/65   Pulse (!) 105   Temp 98.3 F (36.8 C) (Other (Comment))   Resp 12   Ht 6\' 2"  (1.88 m)   Wt 201 lb (91.2 kg)   SpO2 94%   BMI 25.81 kg/m    Physical Exam:  Physical Exam Vitals reviewed.  Cardiovascular:     Rate and Rhythm: Normal rate.  Pulmonary:     Effort: Pulmonary effort is normal.  Genitourinary:    Comments: Penrose drains in place, less induration, minimal drainage, saline flushed, seton moved, tender, 1 penrose removed Neurological:     Mental Status: He is alert.     Assessment:  75 y.o. yo Male with perianal abscess and fistula s/p drain and seton placement. Doing well.   Plan:  - Continue flushing and sitz baths   - Will see next week - Roxicodone refilled   Future Appointments  Date Time Provider [place]  12/12/2020  2:45 PM Virl Cagey, MD [facility] None  12/27/2020  2:45 PM Bernarda Caffey, MD [facility] None     All of the above recommendations were discussed with the patient and patient's family, and all of patient's and family's questions were answered to their expressed satisfaction.  Curlene Labrum, MD [facility] [address] [place], [place] [address] [phone] (office)

## 2020-12-06 ENCOUNTER — Encounter (INDEPENDENT_AMBULATORY_CARE_PROVIDER_SITE_OTHER): Payer: Medicare Other | Admitting: Ophthalmology

## 2020-12-12 ENCOUNTER — Ambulatory Visit (INDEPENDENT_AMBULATORY_CARE_PROVIDER_SITE_OTHER): Payer: Medicare Other | Admitting: General Surgery

## 2020-12-12 ENCOUNTER — Other Ambulatory Visit: Payer: Self-pay

## 2020-12-12 ENCOUNTER — Encounter: Payer: Self-pay | Admitting: General Surgery

## 2020-12-12 VITALS — BP 102/70 | HR 83 | Temp 98.4°F | Resp 16 | Ht 74.0 in | Wt 200.0 lb

## 2020-12-12 DIAGNOSIS — K610 Anal abscess: Secondary | ICD-10-CM

## 2020-12-12 MED ORDER — OXYCODONE HCL 5 MG PO TABS: 5.0000 mg | ORAL_TABLET | ORAL | 0 refills | Status: DC | PRN

## 2020-12-12 NOTE — Progress Notes (Signed)
[facility] Note   HPI:  75 y.o. Male presents to clinic for follow-up evaluation of his perianal abscess and fistula drain placement. Patient reports doing well and the drain fell out from the abscess cavity. The red rubber in the fistula is in place.   Review of Systems:  Continued loose stools/ incontinence at baseline  All other review of systems: otherwise negative   Vital Signs:  BP 102/70   Pulse 83   Temp 98.4 F (36.9 C) (Other (Comment))   Resp 16   Ht 6\' 2"  (1.88 m)   Wt 200 lb (90.7 kg)   SpO2 96%   BMI 25.68 kg/m    Physical Exam:  Physical Exam Vitals reviewed.  Cardiovascular:     Rate and Rhythm: Normal rate.  Pulmonary:     Effort: Pulmonary effort is normal.  Genitourinary:    Comments: I&D site healing, no induration, fistula drain in place, moves easily, improved tenderness      Assessment:  75 y.o. yo Male with fistula in ano and abscess drainage.  Plan:  Continue flushing area for 1 week. More sitz baths at night for pain/ comfort. Keep clean after BMs.  Roxi refilled   Future Appointments  Date Time Provider [place]  12/26/2020  1:45 PM Virl Cagey, MD [facility] None  12/27/2020  2:45 PM Bernarda Caffey, MD [facility] None      Curlene Labrum, MD [facility] [address], [place] [address] [phone] (office)

## 2020-12-12 NOTE — Patient Instructions (Signed)
Continue flushing area for 1 week. More sitz baths at night for pain/ comfort. Keep clean after BMs.

## 2020-12-24 ENCOUNTER — Telehealth (INDEPENDENT_AMBULATORY_CARE_PROVIDER_SITE_OTHER): Payer: Commercial Managed Care - PPO | Admitting: Family Medicine

## 2020-12-24 DIAGNOSIS — K610 Anal abscess: Secondary | ICD-10-CM

## 2020-12-24 MED ORDER — OXYCODONE HCL 5 MG PO TABS: 5.0000 mg | ORAL_TABLET | ORAL | 0 refills | Status: DC | PRN

## 2020-12-24 MED ORDER — AMOXICILLIN-POT CLAVULANATE 875-125 MG PO TABS: 1.0000 | ORAL_TABLET | Freq: Two times a day (BID) | ORAL | 0 refills | Status: AC

## 2020-12-24 NOTE — Telephone Encounter (Signed)
[facility] called and [facility] stating that the pt is having some swelling, drainage and tenderness at the surgical area and wanted to know if we could refill the antibx. Per Dr. Constance Haw ok to refill. Med sent to pharm. Called pt and informed and he would also like a refill on his pain medication.   Patient is requesting a refill on Hydrocodone. ( I set up rx if you will just send to pharm if ok)

## 2020-12-26 ENCOUNTER — Other Ambulatory Visit: Payer: Self-pay

## 2020-12-26 ENCOUNTER — Encounter: Payer: Self-pay | Admitting: General Surgery

## 2020-12-26 ENCOUNTER — Ambulatory Visit (INDEPENDENT_AMBULATORY_CARE_PROVIDER_SITE_OTHER): Payer: Medicare Other | Admitting: General Surgery

## 2020-12-26 VITALS — BP 135/91 | HR 92 | Temp 98.4°F | Resp 16 | Ht 73.0 in | Wt 213.0 lb

## 2020-12-26 DIAGNOSIS — K610 Anal abscess: Secondary | ICD-10-CM

## 2020-12-26 NOTE — Patient Instructions (Signed)
Sitz baths 3+ times a day and in the evening before bed. Follow up in 2 weeks.

## 2020-12-27 ENCOUNTER — Encounter (INDEPENDENT_AMBULATORY_CARE_PROVIDER_SITE_OTHER): Payer: Medicare Other | Admitting: Ophthalmology

## 2020-12-27 DIAGNOSIS — H31002 Unspecified chorioretinal scars, left eye: Secondary | ICD-10-CM

## 2020-12-27 DIAGNOSIS — I10 Essential (primary) hypertension: Secondary | ICD-10-CM

## 2020-12-27 DIAGNOSIS — H35033 Hypertensive retinopathy, bilateral: Secondary | ICD-10-CM

## 2020-12-27 DIAGNOSIS — H25813 Combined forms of age-related cataract, bilateral: Secondary | ICD-10-CM

## 2020-12-27 DIAGNOSIS — H353131 Nonexudative age-related macular degeneration, bilateral, early dry stage: Secondary | ICD-10-CM

## 2020-12-27 DIAGNOSIS — H3581 Retinal edema: Secondary | ICD-10-CM

## 2020-12-27 DIAGNOSIS — H35363 Drusen (degenerative) of macula, bilateral: Secondary | ICD-10-CM

## 2020-12-27 DIAGNOSIS — Z961 Presence of intraocular lens: Secondary | ICD-10-CM

## 2020-12-27 DIAGNOSIS — E119 Type 2 diabetes mellitus without complications: Secondary | ICD-10-CM

## 2020-12-29 NOTE — Progress Notes (Signed)
[facility] Note   HPI:  75 y.o. Male presents to clinic for follow-up evaluation of his perianal abscess. He had some increased swelling and pain this week and we called in antibiotic. He says he still is not doing the sitz baths but just showering off. He uses pain medication at night.  Review of Systems:  Pain Drainage Swelling worsening  All other review of systems: otherwise negative   Vital Signs:  BP (!) 135/91   Pulse 92   Temp 98.4 F (36.9 C) (Other (Comment))   Resp 16   Ht 6\' 1"  (1.854 m)   Wt 213 lb (96.6 kg)   SpO2 98%   BMI 28.10 kg/m    Physical Exam:  Physical Exam Cardiovascular:     Rate and Rhythm: Normal rate.  Pulmonary:     Effort: Pulmonary effort is normal.  Genitourinary:    Comments: Seton in place, posterior right perianal region, indurated and tender, drainage, lidocaine 1% injected, 11 blade used to open area up to allow for better drainage   Assessment:  75 y.o. yo Male with perianal abscess and seton in place, I do not think the seton actually has any muscle in it but given his incontinence already I did not want to risk cutting anything at the time of OR. Distal part opened for better drainage.  Plan:  Sitz baths 3+ times a day and in the evening before bed. Follow up in 2 weeks. Take antibiotic    Curlene Labrum, MD [facility] [address] [place], [place] [address] [phone] (office)

## 2021-01-01 ENCOUNTER — Telehealth: Payer: Self-pay | Admitting: Family Medicine

## 2021-01-01 NOTE — Telephone Encounter (Signed)
Patient's wife called [facility] requesting a refill on Oxycodone.

## 2021-01-03 ENCOUNTER — Telehealth (INDEPENDENT_AMBULATORY_CARE_PROVIDER_SITE_OTHER): Payer: Medicare Other | Admitting: General Surgery

## 2021-01-03 DIAGNOSIS — K610 Anal abscess: Secondary | ICD-10-CM

## 2021-01-03 MED ORDER — OXYCODONE HCL 5 MG PO TABS: 5.0000 mg | ORAL_TABLET | ORAL | 0 refills | Status: DC | PRN

## 2021-01-03 NOTE — Telephone Encounter (Signed)
[facility]  Pain medication refilled.

## 2021-01-09 ENCOUNTER — Ambulatory Visit (INDEPENDENT_AMBULATORY_CARE_PROVIDER_SITE_OTHER): Payer: Medicare Other | Admitting: General Surgery

## 2021-01-09 ENCOUNTER — Other Ambulatory Visit: Payer: Self-pay

## 2021-01-09 ENCOUNTER — Encounter: Payer: Self-pay | Admitting: General Surgery

## 2021-01-09 VITALS — BP 126/82 | HR 85 | Temp 98.7°F | Resp 12 | Ht 73.0 in | Wt 211.0 lb

## 2021-01-09 DIAGNOSIS — K610 Anal abscess: Secondary | ICD-10-CM

## 2021-01-09 MED ORDER — OXYCODONE HCL 5 MG PO TABS: 5.0000 mg | ORAL_TABLET | ORAL | 0 refills | Status: DC | PRN

## 2021-01-09 NOTE — Patient Instructions (Signed)
Continue sitz baths. Will refill pain medication. Will refer back to Leighton Ruff to see her thoughts.  If you need anything or have issues call us.

## 2021-01-09 NOTE — Progress Notes (Signed)
[facility] Note   HPI:  75 y.o. Male presents to clinic for follow-up evaluation of his fistula in ano. He still having drainage and says he has his normal incontinence. He says there is tenderness in the area and the sitz baths did help some. He takes creon but only on occasion due to the cost.   Review of Systems:  Continued anal tenderness Continued drainage All other review of systems: otherwise negative   Vital Signs:  BP 126/82   Pulse 85   Temp 98.7 F (37.1 C) (Other (Comment))   Resp 12   Ht 6\' 1"  (1.854 m)   Wt 211 lb (95.7 kg)   SpO2 97%   BMI 27.84 kg/m    Physical Exam:  Physical Exam HENT:     Head: Normocephalic.  Cardiovascular:     Rate and Rhythm: Normal rate.  Pulmonary:     Effort: Pulmonary effort is normal.  Genitourinary:    Comments: Improving perianal induration, red seton in place, moves freely, no obvious sphincter involved but patient tender   Assessment:  75 y.o. yo Male with history of sigmoid cancer and prostate cancer, fistula in ano and seton in place and it all looks like it is superficial to me in the subcutaneous tissue but there Is still some induration. He had an I&D and drain placement and seton placement 11/29/2020. He has a history laparoscopic sigmoidectomy in 2016  for cancer and then subsequent mild sigmoid stricture which passed the scope.   He has chronic incontinence of yellows mucus and what he thinks is stool 20+ times a day and had this before any I&D or seton placement. He has not seen Dr. Marcello Moores in some time. He reports having a colonoscopy with Dr. Alessandra Bevels about 1 year after.  He has not had anything since time. I cannot find this documentation in our chart.   Plan:  -Given the incontinence and other issues will refer him back to Dr. Marcello Moores as a fistulotomy could likely be done but healing may be a challenge with his incontinence. She may also want to look at the anastomosis. The CT from may looks like  everything is open but unsure if any of his issues with the drainage/ incontinence could be overflow.    Will refill pain meds. Will refer out and follow up as needed/ when needed with me.  Sent Dr. Marcello Moores a message.   Curlene Labrum, MD [facility] [address] [place], [place] [address] [phone] (office)

## 2021-01-29 ENCOUNTER — Telehealth: Payer: Self-pay | Admitting: General Surgery

## 2021-01-29 NOTE — Telephone Encounter (Signed)
Appointment scheduled with Dr. Leighton Ruff at [facility] for 03-10-2021 at 11:20 am. Patient is to arrive at their office by 10:45 am.

## 2021-01-29 NOTE — Telephone Encounter (Signed)
Pt's wife aware of apt information.

## 2021-01-30 ENCOUNTER — Telehealth (INDEPENDENT_AMBULATORY_CARE_PROVIDER_SITE_OTHER): Payer: Medicare Other | Admitting: General Surgery

## 2021-01-30 DIAGNOSIS — K610 Anal abscess: Secondary | ICD-10-CM

## 2021-01-30 MED ORDER — OXYCODONE HCL 5 MG PO TABS: 5.0000 mg | ORAL_TABLET | ORAL | 0 refills | Status: DC | PRN

## 2021-01-30 NOTE — Telephone Encounter (Signed)
[facility]  Patient asking for more pain medication. He is seeing Dr. Marcello Moores soon. He has a seton in place. Roxicodone 5mg  q4 PRN (10 tablets) sent in.  Curlene Labrum, MD [facility] [address] [place], [place] [address] [phone] (office)

## 2021-01-30 NOTE — Progress Notes (Addendum)
[facility] Note  01/31/2021     CHIEF COMPLAINT Patient presents for Retina Follow Up   HISTORY OF PRESENT ILLNESS: Anthony [name] is a 75 y.o. male who presents to the clinic today for:   HPI     Retina Follow Up   Patient presents with  Dry AMD.  In both eyes.  Duration of 6 months.  Since onset it is stable.  I, the attending physician,  performed the HPI with the patient and updated documentation appropriately.        Comments   Pt here for 6 mo ret f/u CR scarring OS/non EXU ARMD OU. Pt states vision is the same. No changes or complaints. Blood sugar level-102 this am/most recent A1c-7.1.       Last edited by Bernarda Caffey, MD on 02/04/2021 12:30 PM.      pt states vision is stable  Referring physician: Hortencia Pilar, MD [place],  [place] [address]  HISTORICAL INFORMATION:   Selected notes from the MEDICAL RECORD NUMBER Referred by Dr. Kathlen Mody for cat sx clearance   CURRENT MEDICATIONS: Current Outpatient Medications (Ophthalmic Drugs)  Medication Sig   carboxymethylcellulose (REFRESH PLUS) 0.5 % SOLN Apply 1 drop to eye daily as needed (FOR EYE IRRITATION).    No current facility-administered medications for this visit. (Ophthalmic Drugs)   Current Outpatient Medications (Other)  Medication Sig   acetaminophen (TYLENOL) 325 MG tablet Take 2 tablets (650 mg total) by mouth every 6 (six) hours as needed for mild pain (or Fever >/= 101).   amLODipine (NORVASC) 10 MG tablet Take 10 mg by mouth every morning.    aspirin 325 MG tablet Take 325 mg by mouth 2 (two) times a week. And takes as needed for headaches   celecoxib (CELEBREX) 200 MG capsule Take 200 mg by mouth daily.   Evolocumab (REPATHA) 140 MG/ML SOSY Inject 140 mg into the skin every 14 (fourteen) days.   feeding supplement (ENSURE ENLIVE / ENSURE PLUS) LIQD Take 237 mLs by mouth 2 (two) times daily between meals.   fosinopril (MONOPRIL) 10 MG tablet  Take 10 mg by mouth daily.   insulin aspart (NOVOLOG) 100 UNIT/ML injection Inject 30-45 Units into the skin daily as needed for high blood sugar. Via pump   lipase/protease/amylase (CREON) 36000 UNITS CPEP capsule Take 1 capsule (36,000 Units total) by mouth 3 (three) times daily before meals.   Nutritional Supplements (,FEEDING SUPPLEMENT, PROSOURCE PLUS) liquid Take 30 mLs by mouth 2 (two) times daily between meals.   ondansetron (ZOFRAN) 4 MG tablet Take 1 tablet (4 mg total) by mouth every 6 (six) hours as needed for nausea.   oxyCODONE (OXY IR/ROXICODONE) 5 MG immediate release tablet Take 1 tablet (5 mg total) by mouth every 4 (four) hours as needed for severe pain or breakthrough pain.   pantoprazole (PROTONIX) 40 MG tablet Take 40 mg by mouth every morning.    tamsulosin (FLOMAX) 0.4 MG CAPS capsule Take 1 capsule (0.4 mg total) by mouth daily as needed. For urinary urgency after prostate radiation. (Patient taking differently: Take 0.4 mg by mouth daily.)   traZODone (DESYREL) 50 MG tablet Take 50 mg by mouth at bedtime.   No current facility-administered medications for this visit. (Other)      REVIEW OF SYSTEMS: ROS   Positive for: Gastrointestinal, Endocrine, Eyes Negative for: Constitutional, Neurological, Skin, Genitourinary, Musculoskeletal, HENT, Cardiovascular, Respiratory, Psychiatric, Allergic/Imm, Heme/Lymph Last edited by Moshe Cipro,  Makenzie E, COT on 01/31/2021  2:11 PM.        ALLERGIES Allergies  Allergen Reactions   Atorvastatin Other (See Comments)    Reports elevated liver enzymes.   Other Other (See Comments)    Positive allergy test for peanuts and almonds   Shellfish Allergy Other (See Comments)    Positive allergy test   Toradol [Ketorolac Tromethamine] Other (See Comments)    Pt has chronic calcific pancreatitis   Adhesive [Tape] Rash    Tegaderm     PAST MEDICAL HISTORY Past Medical History:  Diagnosis Date   Agent orange exposure    Anxiety     At risk for sleep apnea    STOP BANG SCORE= 5             SENT TO PCP 11-24-2017   Chronic calcific pancreatitis ([place]) 2000   Colon cancer ([place])    09/ 2016  sigmoid cancer  s/p  left hemicolectomy (negative nodes and margins)   Depression    GERD (gastroesophageal reflux disease)    Hemorrhoids    History of adenomatous polyp of colon    Hyperlipidemia    Hyperplasia of prostate with lower urinary tract symptoms (LUTS)    Hypertension    Lipoma    Peripheral neuropathy    Prostate cancer [facility]) urologist-  dr Tresa Moore  oncologist-  dr Tammi Klippel   dx 08-02-2017-- Stage T1c, Gleason, 3+4, PSA 5.07, vol 38cc--  scheduled for radioactive seed implants 11-29-2017   Type 2 diabetes mellitus with insulin therapy ([place])    Pt reports he is Type 1 Diabetic-diagnosed at age 26/  followed by pcp   Wears glasses    Past Surgical History:  Procedure Laterality Date   COLONOSCOPY  last one 03-14-2015   CYSTOSCOPY N/A 11/29/2017   Procedure: CYSTOSCOPY;  Surgeon: Alexis Frock, MD;  Location: [facility];  Service: Urology;  Laterality: N/A;  no seeds in bladder per Dr Tresa Moore   EVALUATION UNDER ANESTHESIA WITH HEMORRHOIDECTOMY N/A 05/29/2015   Procedure: ANAL EXAM UNDER ANESTHESIA WITH  HEMORRHOIDOPEXY; RIGID SIGMOIDOSCOPY;  Surgeon: Leighton Ruff, MD;  Location: [place];  Service: General;  Laterality: N/A;   INCISION AND DRAINAGE PERIRECTAL ABSCESS N/A 11/27/2020   Procedure: IRRIGATION AND DEBRIDEMENT PERIRECTAL ABSCESS with drain placement and seton placement;  Surgeon: Virl Cagey, MD;  Location: [facility];  Service: General;  Laterality: N/A;   LAPAROSCOPIC PARTIAL COLECTOMY N/A 03/29/2015   Procedure: LAPAROSCOPIC SIGMOIDECTOMY;  Surgeon: Leighton Ruff, MD;  Location: [facility];  Service: General;  Laterality: N/A;   NASAL SEPTUM SURGERY  2006   PROSTATE BIOPSY  08-02-2017  dr Tresa Moore office   RADIOACTIVE SEED IMPLANT N/A 11/29/2017   Procedure: RADIOACTIVE SEED  IMPLANT/BRACHYTHERAPY IMPLANT;  Surgeon: Alexis Frock, MD;  Location: [facility];  Service: Urology;  Laterality: N/A;   SPACE OAR INSTILLATION N/A 11/29/2017   Procedure: SPACE OAR INSTILLATION;  Surgeon: Alexis Frock, MD;  Location: [facility];  Service: Urology;  Laterality: N/A;    FAMILY HISTORY Family History  Problem Relation Age of Onset   Hyperlipidemia Father    Stroke Father    Prostate cancer Father    Heart attack Father    Diabetes Maternal Uncle    Cancer Brother        unknown blood cancer   Healthy Mother     SOCIAL HISTORY Social History   Tobacco Use   Smoking status: Former  Years: 30.00    Pack years: 0.00    Types: Cigarettes    Quit date: 06/26/2009    Years since quitting: 11.6   Smokeless tobacco: Never  Vaping Use   Vaping Use: Never used  Substance Use Topics   Alcohol use: Not Currently   Drug use: No         OPHTHALMIC EXAM:  Base Eye Exam     Visual Acuity (Snellen - Linear)       Right Left   Dist [place] 20/20 -1 20/20 -1    Correction: Glasses         Tonometry (Tonopen, 2:21 PM)       Right Left   Pressure 9 8         Pupils       Dark Light Shape React APD   Right 3 2 Round Minimal None   Left 3 2 Round Minimal None         Visual Fields (Counting fingers)       Left Right    Full Full         Extraocular Movement       Right Left    Full, Ortho Full, Ortho         Neuro/Psych     Oriented x3: Yes   Mood/Affect: Normal         Dilation     Both eyes: 2.5% Phenylephrine, 1.0% Mydriacyl @ 2:22 PM           Slit Lamp and Fundus Exam     Slit Lamp Exam       Right Left   Lids/Lashes Dermatochalasis - upper lid, Ptosis Dermatochalasis - upper lid, mild Ptosis   Conjunctiva/Sclera Mild Melanosis, inferior scleral show Melanosis, temporal pinguecula, inferior scleral show   Cornea Arcus, well healed cataract wounds Arcus, well healed cataract wounds    Anterior Chamber deep and clear deep and clear   Iris Round and moderately dilated Round and moderately dilated   Lens PC IOL in excellent position PC IOL in excellent poistion   Vitreous Vitreous syneresis Vitreous syneresis         Fundus Exam       Right Left   Disc Pink and Sharp Pink and Sharp, +cupping and central pallor   C/D Ratio 0.6 0.7   Macula good foveal reflex, Retinal pigment epithelial mottling, scattered Drusen, No heme.  Focal shallow SRF superior macula - improved from prior good foveal reflex, Retinal pigment epithelial mottling and clumping, Drusen, No heme or edema, RPE atrophy and mild ERM   Vessels Vascular attenuation, mild Copper wiring, mild Tortuousity, AV crossing changes Vascular attenuation, mild Copper wiring, Tortuous, mild AV crossing changes   Periphery Attached, scattered RPE changes, peripheral drusen, No heme  Attached, focal patch of CR scarring just outside IT arcades and inferiorly -- stable, scattered peripheral drusen, No heme            Refraction     Wearing Rx     Type: SVL            IMAGING AND PROCEDURES  Imaging and Procedures for '@TODAY' @  OCT, Retina - OU - Both Eyes       Right Eye Quality was good. Central Foveal Thickness: 257. Progression has been stable. Findings include no IRF, retinal drusen , vitreomacular adhesion , normal foveal contour, pigment epithelial detachment, subretinal fluid (Interval improvement in focal SRF superior to fovea, scattered small PED's, Mild  scattered drusen, Focal ellipsoid disruption nasal macula -- stable).   Left Eye Quality was good. Central Foveal Thickness: 249. Progression has been stable. Findings include normal foveal contour, no IRF, no SRF, vitreomacular adhesion , pigment epithelial detachment, outer retinal atrophy, retinal drusen (Focal ellipsoid disruption IN and temporal macula, larger geographic area of inner and outer retinal atrophy IT midzone caught on widefield --  stable from prior, rare drusen).   Notes *Images captured and stored on drive  Diagnosis / Impression:  NFP, no IRF OU +retinal drusen OU -- non-exudative ARMD OD: Interval improvement in focal SRF superior to fovea, scattered small PED's,  Mild scattered drusen, Focal ellipsoid disruption nasal macula -- stable OS: Focal ellipsoid disruption IN and temporal macula, larger geographic area of inner and outer retinal atrophy IT midzone caught on widefield -- stable from prior, rare drusen  Clinical management:  See below  Abbreviations: NFP - Normal foveal profile. CME - cystoid macular edema. PED - pigment epithelial detachment. IRF - intraretinal fluid. SRF - subretinal fluid. EZ - ellipsoid zone. ERM - epiretinal membrane. ORA - outer retinal atrophy. ORT - outer retinal tubulation. SRHM - subretinal hyper-reflective material               ASSESSMENT/PLAN:    ICD-10-CM   1. Chorioretinal scar of left eye  H31.002     2. Early dry stage nonexudative age-related macular degeneration of both eyes  H35.3131     3. Retinal drusen of both eyes  H35.363     4. Retinal edema  H35.81 OCT, Retina - OU - Both Eyes    5. Diabetes mellitus type 2 without retinopathy ([place])  E11.9     6. Essential hypertension  I10     7. Hypertensive retinopathy of both eyes  H35.033     8. Pseudophakia of both eyes  Z96.1       1 Chorioretinal Scarring OS -- stable  - geographic patch of pigmented CR atrophy just outside IT arcades  - scattered patches of outer retinal atrophy  - per records, likely chronic and present on earliest exams in 2014 w/ Dr. Herbert Deaner  - no intervention indicated  - continue monitoring  - f/u 6 months  2-4. Age related macular degeneration, non-exudative, both eyes  - early dry stage  - BCVA improved post cataract surgery to 20/20 OU  - OCT shows OD: Interval improvement in focal SRF superior to fovea, scattered small PED's,  Mild scattered drusen, Focal ellipsoid  disruption nasal macula -- stable; OS: Focal ellipsoid disruption IN and temporal macula, larger geographic area of inner and outer retinal atrophy IT midzone caught on widefield -- stable from prior, rare drusen  - FA 10.19.21 shows CNV superior macula corresponding to focal SRF OD  - discussed findings  - no intervention recommended as CNV remains minimally active, BCVA remains 20/20 and pt asymptomatic  - continue amsler grid monitoring  - f/u 6 months, sooner prn DFE, OCT  5. Diabetes mellitus, type 2 without retinopathy  - The incidence, risk factors for progression, natural history and treatment options for diabetic retinopathy  were discussed with patient.    - The need for close monitoring of blood glucose, blood pressure, and serum lipids, avoiding cigarette or any type of tobacco, and the need for long term follow up was also discussed with patient.  - f/u in 1 year, sooner prn  6,7. Hypertensive retinopathy OU  - discussed importance of tight BP control  - monitor  8. Pseudophakia OU  - s/p CEIOL OD on 12.14.20 and OS on 12.28.20 with Dr. Kathlen Mody.  - beautiful surgeries w/ uncorrected VA 20/20 OU  - pt extremely happy   Ophthalmic Meds Ordered this visit:  No orders of the defined types were placed in this encounter.      Return in about 6 months (around 08/03/2021) for f/u exu ARMD OU, DFE, OCT.  There are no Patient Instructions on file for this visit.   Explained the diagnoses, plan, and follow up with the patient and they expressed understanding.  Patient expressed understanding of the importance of proper follow up care.   This document serves as a record of services personally performed by Gardiner Sleeper, MD, PhD. It was created on their behalf by San Jetty. Owens Shark, OA an ophthalmic technician. The creation of this record is the provider's dictation and/or activities during the visit.    Electronically signed by: San Jetty. Marguerita Merles 07.07.2022 12:34 PM   Gardiner Sleeper, M.D., Ph.D. Diseases & Surgery of the Retina and Vitreous [facility]  I have reviewed the above documentation for accuracy and completeness, and I agree with the above. Gardiner Sleeper, M.D., Ph.D. 02/04/21 12:34 PM    Abbreviations: M myopia (nearsighted); A astigmatism; H hyperopia (farsighted); P presbyopia; Mrx spectacle prescription;  CTL contact lenses; OD right eye; OS left eye; OU both eyes  XT exotropia; ET esotropia; PEK punctate epithelial keratitis; PEE punctate epithelial erosions; DES dry eye syndrome; MGD meibomian gland dysfunction; ATs artificial tears; PFAT's preservative free artificial tears; [place] nuclear sclerotic cataract; PSC posterior subcapsular cataract; ERM epi-retinal membrane; PVD posterior vitreous detachment; RD retinal detachment; DM diabetes mellitus; DR diabetic retinopathy; NPDR non-proliferative diabetic retinopathy; PDR proliferative diabetic retinopathy; CSME clinically significant macular edema; DME diabetic macular edema; dbh dot blot hemorrhages; CWS cotton wool spot; POAG primary open angle glaucoma; C/D cup-to-disc ratio; HVF humphrey visual field; GVF goldmann visual field; OCT optical coherence tomography; IOP intraocular pressure; BRVO Branch retinal vein occlusion; CRVO central retinal vein occlusion; CRAO central retinal artery occlusion; BRAO branch retinal artery occlusion; RT retinal tear; SB scleral buckle; PPV pars plana vitrectomy; VH Vitreous hemorrhage; PRP panretinal laser photocoagulation; IVK intravitreal kenalog; VMT vitreomacular traction; MH Macular hole;  NVD neovascularization of the disc; NVE neovascularization elsewhere; AREDS age related eye disease study; ARMD age related macular degeneration; POAG primary open angle glaucoma; EBMD epithelial/anterior basement membrane dystrophy; ACIOL anterior chamber intraocular lens; IOL intraocular lens; PCIOL posterior chamber intraocular lens; Phaco/IOL  phacoemulsification with intraocular lens placement; [place] photorefractive keratectomy; LASIK laser assisted in situ keratomileusis; HTN hypertension; DM diabetes mellitus; COPD chronic obstructive pulmonary disease

## 2021-01-31 ENCOUNTER — Ambulatory Visit (INDEPENDENT_AMBULATORY_CARE_PROVIDER_SITE_OTHER): Payer: Medicare Other | Admitting: Ophthalmology

## 2021-01-31 ENCOUNTER — Encounter (INDEPENDENT_AMBULATORY_CARE_PROVIDER_SITE_OTHER): Payer: Self-pay | Admitting: Ophthalmology

## 2021-01-31 ENCOUNTER — Other Ambulatory Visit: Payer: Self-pay

## 2021-01-31 DIAGNOSIS — I10 Essential (primary) hypertension: Secondary | ICD-10-CM

## 2021-01-31 DIAGNOSIS — H31002 Unspecified chorioretinal scars, left eye: Secondary | ICD-10-CM | POA: Diagnosis not present

## 2021-01-31 DIAGNOSIS — H3581 Retinal edema: Secondary | ICD-10-CM

## 2021-01-31 DIAGNOSIS — E119 Type 2 diabetes mellitus without complications: Secondary | ICD-10-CM

## 2021-01-31 DIAGNOSIS — H353131 Nonexudative age-related macular degeneration, bilateral, early dry stage: Secondary | ICD-10-CM | POA: Diagnosis not present

## 2021-01-31 DIAGNOSIS — H35033 Hypertensive retinopathy, bilateral: Secondary | ICD-10-CM

## 2021-01-31 DIAGNOSIS — H35363 Drusen (degenerative) of macula, bilateral: Secondary | ICD-10-CM | POA: Diagnosis not present

## 2021-01-31 DIAGNOSIS — Z961 Presence of intraocular lens: Secondary | ICD-10-CM | POA: Diagnosis not present

## 2021-01-31 NOTE — Telephone Encounter (Signed)
Pt's wife aware.

## 2021-02-04 ENCOUNTER — Encounter (INDEPENDENT_AMBULATORY_CARE_PROVIDER_SITE_OTHER): Payer: Self-pay | Admitting: Ophthalmology

## 2021-03-02 ENCOUNTER — Emergency Department (HOSPITAL_COMMUNITY)
Admission: EM | Admit: 2021-03-02 | Discharge: 2021-03-03 | Disposition: A | Discharge status: Home / Self Care | Payer: Medicare Other | Attending: Emergency Medicine | Admitting: Emergency Medicine

## 2021-03-02 ENCOUNTER — Other Ambulatory Visit: Payer: Self-pay

## 2021-03-02 ENCOUNTER — Emergency Department (HOSPITAL_COMMUNITY): Payer: Medicare Other

## 2021-03-02 ENCOUNTER — Encounter (HOSPITAL_COMMUNITY): Payer: Self-pay | Admitting: Emergency Medicine

## 2021-03-02 DIAGNOSIS — E1165 Type 2 diabetes mellitus with hyperglycemia: Secondary | ICD-10-CM | POA: Diagnosis not present

## 2021-03-02 DIAGNOSIS — Z87891 Personal history of nicotine dependence: Secondary | ICD-10-CM | POA: Diagnosis not present

## 2021-03-02 DIAGNOSIS — M25572 Pain in left ankle and joints of left foot: Secondary | ICD-10-CM | POA: Diagnosis not present

## 2021-03-02 DIAGNOSIS — M7989 Other specified soft tissue disorders: Secondary | ICD-10-CM | POA: Diagnosis not present

## 2021-03-02 DIAGNOSIS — Z79899 Other long term (current) drug therapy: Secondary | ICD-10-CM | POA: Diagnosis not present

## 2021-03-02 DIAGNOSIS — I10 Essential (primary) hypertension: Secondary | ICD-10-CM | POA: Diagnosis not present

## 2021-03-02 DIAGNOSIS — X500XXA Overexertion from strenuous movement or load, initial encounter: Secondary | ICD-10-CM | POA: Diagnosis not present

## 2021-03-02 DIAGNOSIS — S82832A Other fracture of upper and lower end of left fibula, initial encounter for closed fracture: Secondary | ICD-10-CM | POA: Insufficient documentation

## 2021-03-02 DIAGNOSIS — S99912A Unspecified injury of left ankle, initial encounter: Secondary | ICD-10-CM | POA: Diagnosis present

## 2021-03-02 IMAGING — DX DG ANKLE COMPLETE 3+V*L*
4 series · 4 of 4 positions shown · non-contrast
Comparison: None.

CLINICAL DATA: Injury, swelling

EXAM:
LEFT ANKLE COMPLETE - 3+ VIEW

[ankle ap]
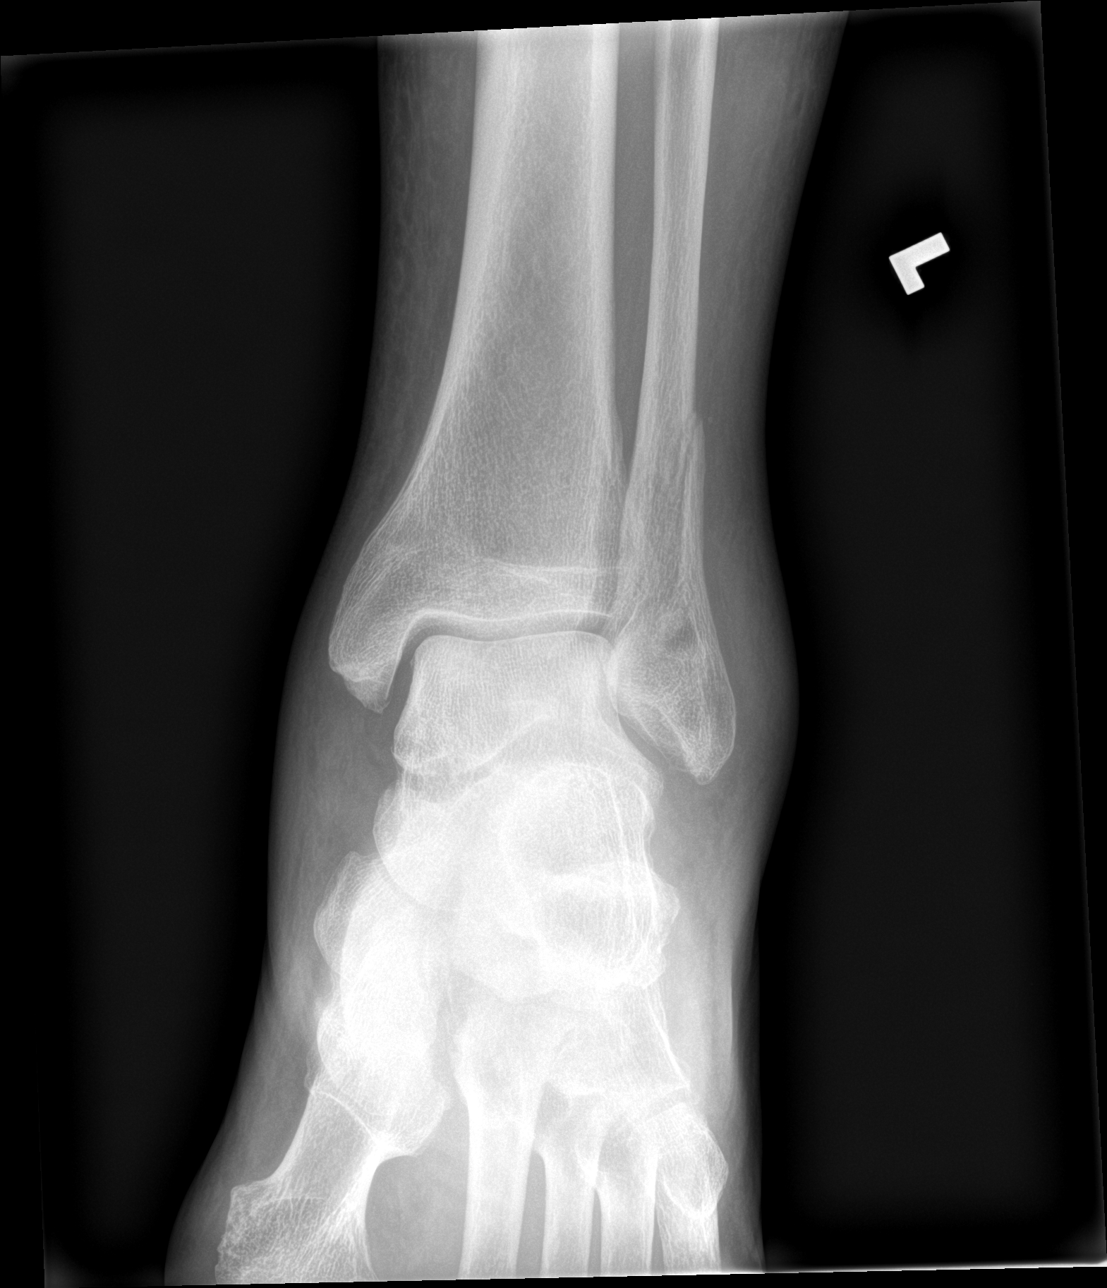

[ankle obl]
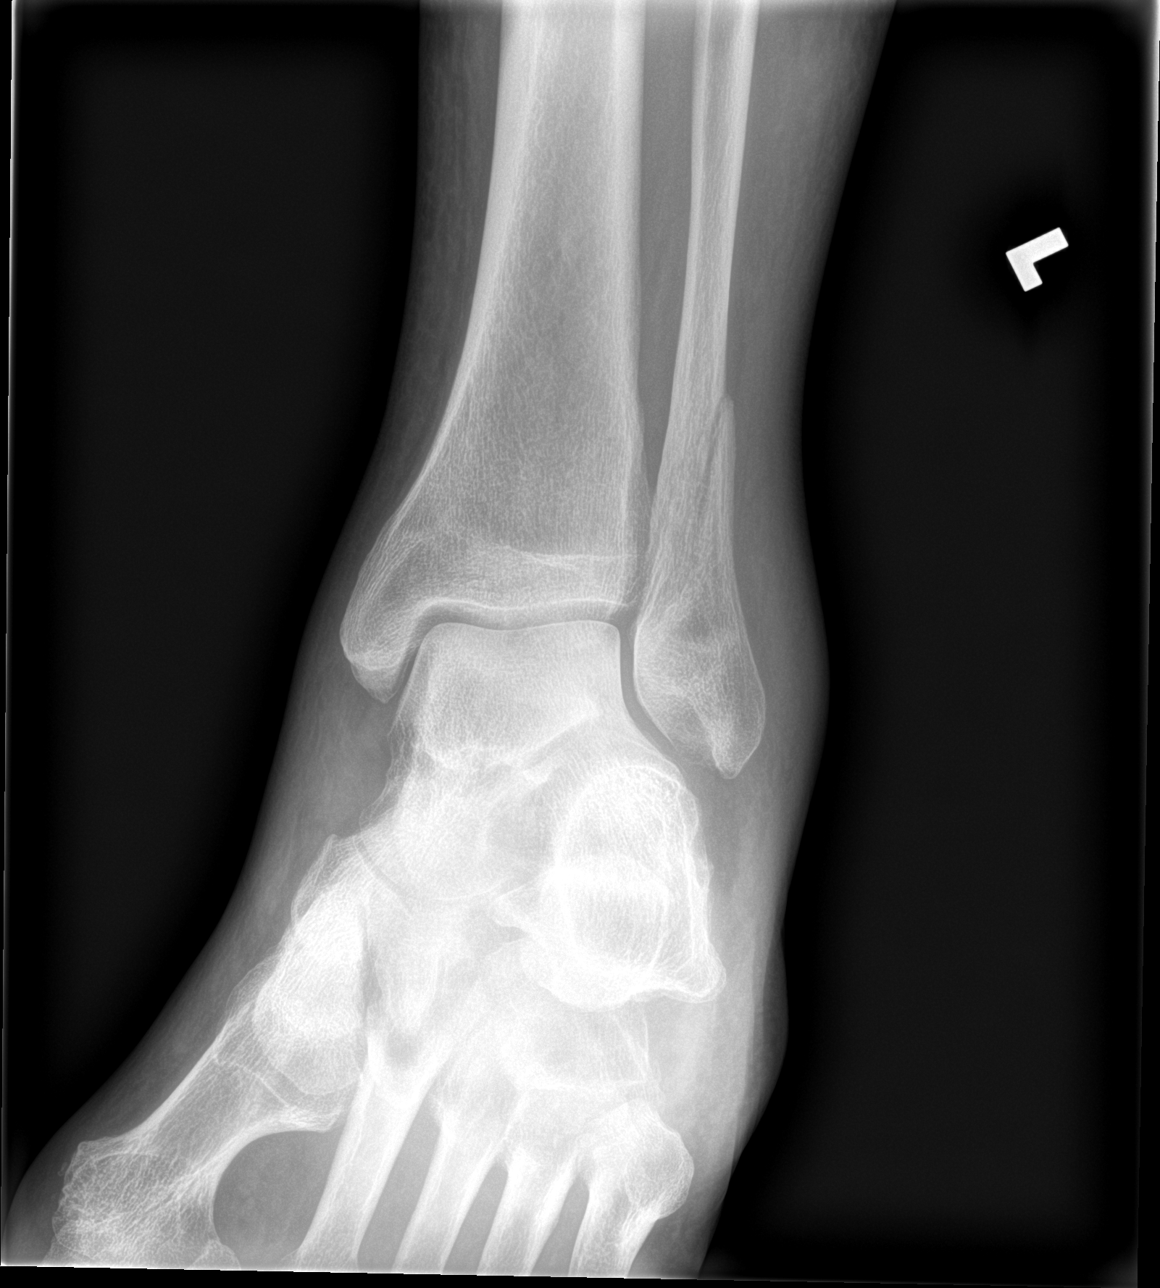

[ankle lat (1 of 2)]
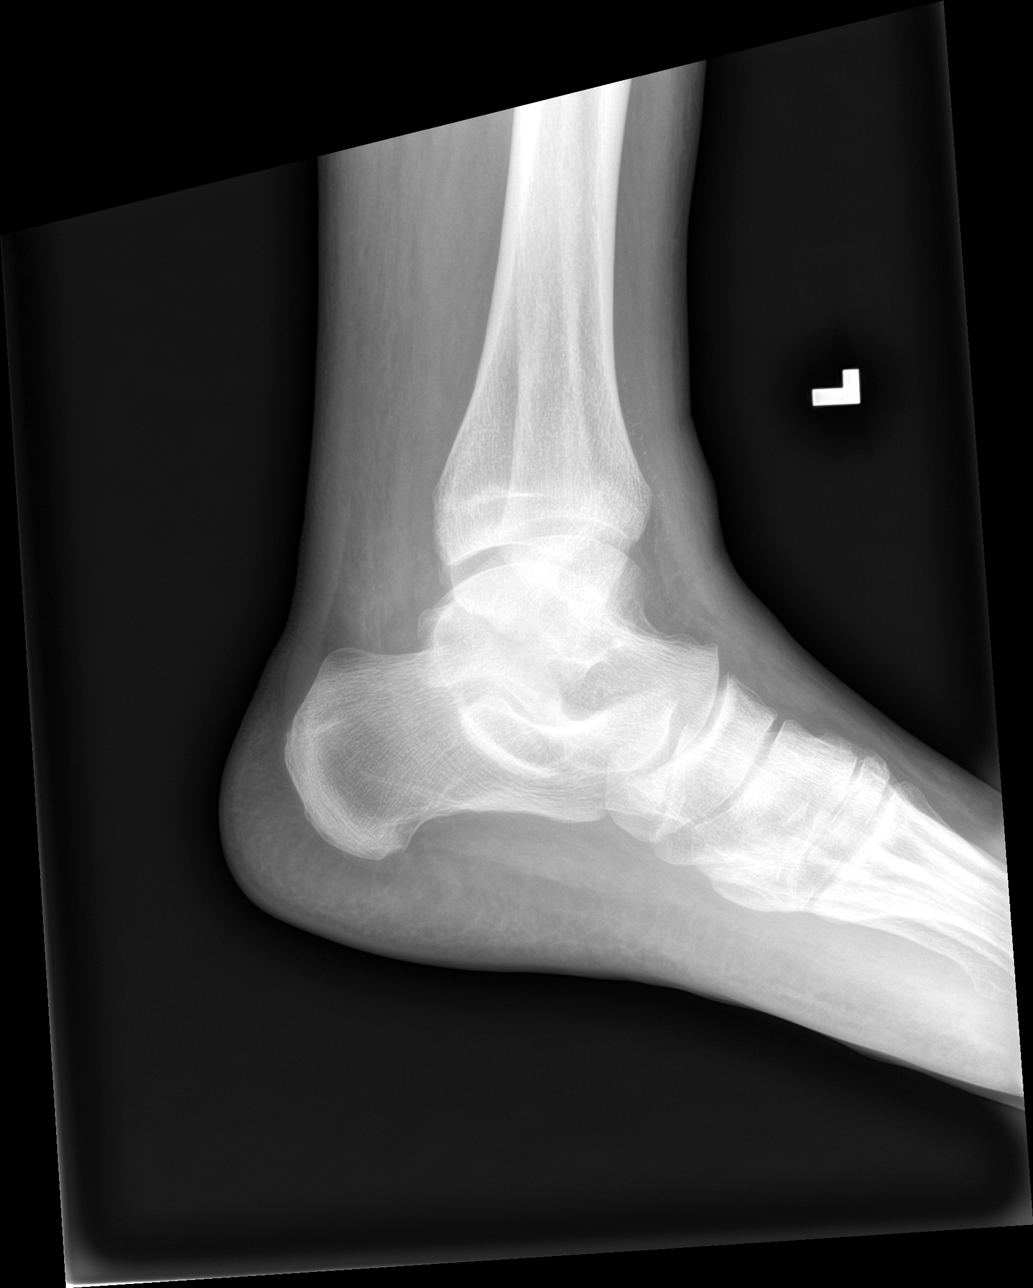

[ankle lat (2 of 2)]
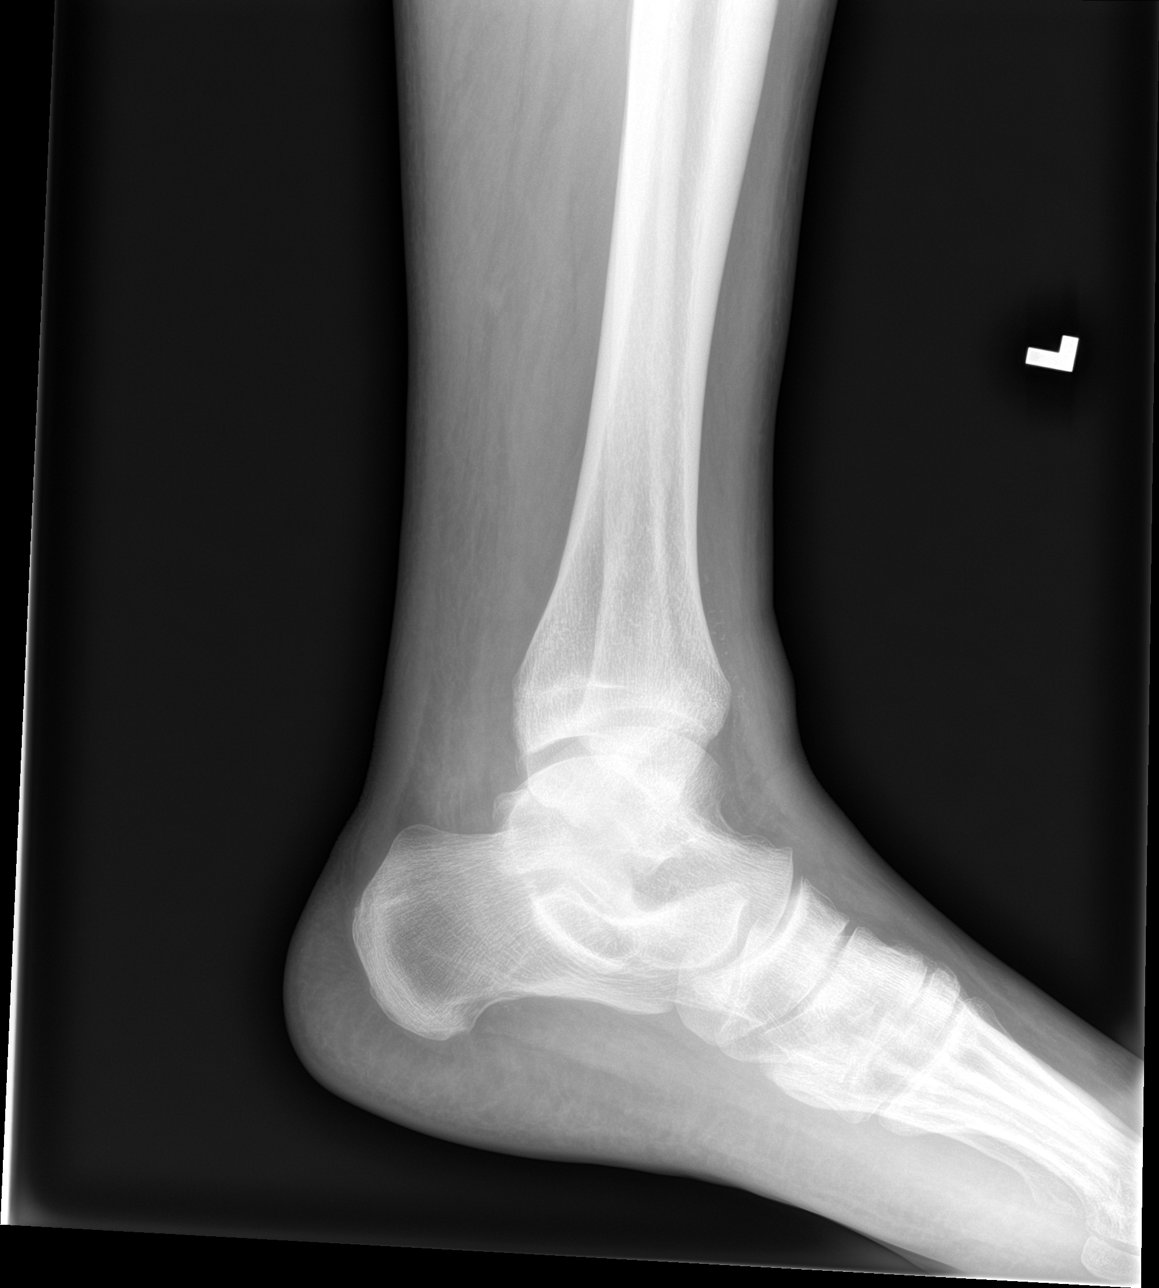

[4 of 4 positions shown; findings below may reference images not displayed]

FINDINGS: There is an oblique fracture through the distal fibula. Minimal
displacement. No tibial abnormality. No subluxation or dislocation.
IMPRESSION: Oblique minimally displaced distal fibular fracture.

## 2021-03-02 NOTE — ED Provider Notes (Signed)
[facility] EMERGENCY DEPARTMENT Provider Note   CSN: [id number] Arrival date & time: 03/02/21  2011     History No chief complaint on file.   Anthony [name] is a 75 y.o. male.  Patient presents to the emergency department for evaluation of ankle injury.  Patient reports that he stood up from a seated position this afternoon and heard a pop in the outside portion of his left ankle.  He has been having pain, swelling ever since this occurred.  He cannot bear weight.  His third fourth and fifth toes on the left foot have gone numb.      Past Medical History:  Diagnosis Date   Agent orange exposure    Anxiety    At risk for sleep apnea    STOP BANG SCORE= 5             SENT TO PCP 11-24-2017   Chronic calcific pancreatitis ([place]) 2000   Colon cancer [facility])    09/ 2016  sigmoid cancer  s/p  left hemicolectomy (negative nodes and margins)   Depression    GERD (gastroesophageal reflux disease)    Hemorrhoids    History of adenomatous polyp of colon    Hyperlipidemia    Hyperplasia of prostate with lower urinary tract symptoms (LUTS)    Hypertension    Lipoma    Peripheral neuropathy    Prostate cancer [facility]) urologist-  dr Tresa Moore  oncologist-  dr Tammi Klippel   dx 08-02-2017-- Stage T1c, Gleason, 3+4, PSA 5.07, vol 38cc--  scheduled for radioactive seed implants 11-29-2017   Type 2 diabetes mellitus with insulin therapy ([place])    Pt reports he is Type 1 Diabetic-diagnosed at age 71/  followed by pcp   Wears glasses     Patient Active Problem List   Diagnosis Date Noted   Hx of insulin dependent diabetes mellitus    Hypokalemia    Perianal abscess    Sepsis with acute renal failure without septic shock ([place])    Severe sepsis ([place])    Peri-rectal abscess 11/26/2020   Cervical spondylolysis 05/01/2020   Bilateral low back pain with right-sided sciatica 04/10/2020   Paresthesia 02/12/2020   Hand muscle weakness 02/12/2020   Malignant neoplasm of prostate ([place]) 08/25/2017   Increased  bowel frequency 08/25/2017   Colon cancer ([place]) 03/29/2015    Past Surgical History:  Procedure Laterality Date   COLONOSCOPY  last one 03-14-2015   CYSTOSCOPY N/A 11/29/2017   Procedure: CYSTOSCOPY;  Surgeon: Alexis Frock, MD;  Location: [facility];  Service: Urology;  Laterality: N/A;  no seeds in bladder per Dr Tresa Moore   EVALUATION UNDER ANESTHESIA WITH HEMORRHOIDECTOMY N/A 05/29/2015   Procedure: ANAL EXAM UNDER ANESTHESIA WITH  HEMORRHOIDOPEXY; RIGID SIGMOIDOSCOPY;  Surgeon: Leighton Ruff, MD;  Location: [place];  Service: General;  Laterality: N/A;   INCISION AND DRAINAGE PERIRECTAL ABSCESS N/A 11/27/2020   Procedure: IRRIGATION AND DEBRIDEMENT PERIRECTAL ABSCESS with drain placement and seton placement;  Surgeon: Virl Cagey, MD;  Location: [facility];  Service: General;  Laterality: N/A;   LAPAROSCOPIC PARTIAL COLECTOMY N/A 03/29/2015   Procedure: LAPAROSCOPIC SIGMOIDECTOMY;  Surgeon: Leighton Ruff, MD;  Location: [facility];  Service: General;  Laterality: N/A;   NASAL SEPTUM SURGERY  2006   PROSTATE BIOPSY  08-02-2017  dr Tresa Moore office   RADIOACTIVE SEED IMPLANT N/A 11/29/2017   Procedure: RADIOACTIVE SEED IMPLANT/BRACHYTHERAPY IMPLANT;  Surgeon: Alexis Frock, MD;  Location: [facility];  Service: Urology;  Laterality: N/A;   SPACE OAR INSTILLATION N/A 11/29/2017   Procedure: SPACE OAR INSTILLATION;  Surgeon: Alexis Frock, MD;  Location: [facility];  Service: Urology;  Laterality: N/A;       Family History  Problem Relation Age of Onset   Hyperlipidemia Father    Stroke Father    Prostate cancer Father    Heart attack Father    Diabetes Maternal Uncle    Cancer Brother        unknown blood cancer   Healthy Mother     Social History   Tobacco Use   Smoking status: Former    Years: 30.00    Types: Cigarettes    Quit date: 06/26/2009    Years since quitting: 11.6   Smokeless tobacco: Never  Vaping Use    Vaping Use: Never used  Substance Use Topics   Alcohol use: Not Currently   Drug use: No    Home Medications Prior to Admission medications   Medication Sig Start Date End Date Taking? Authorizing Provider  acetaminophen (TYLENOL) 325 MG tablet Take 2 tablets (650 mg total) by mouth every 6 (six) hours as needed for mild pain (or Fever >/= 101). 11/30/20   [name], DO  amLODipine (NORVASC) 10 MG tablet Take 10 mg by mouth every morning.     [provider]  aspirin 325 MG tablet Take 325 mg by mouth 2 (two) times a week. And takes as needed for headaches    [provider]  carboxymethylcellulose (REFRESH PLUS) 0.5 % SOLN Apply 1 drop to eye daily as needed (FOR EYE IRRITATION).     [provider]  celecoxib (CELEBREX) 200 MG capsule Take 200 mg by mouth daily. 02/05/20   [provider]  Evolocumab (REPATHA) 140 MG/ML SOSY Inject 140 mg into the skin every 14 (fourteen) days.    [provider]  feeding supplement (ENSURE ENLIVE / ENSURE PLUS) LIQD Take 237 mLs by mouth 2 (two) times daily between meals. 11/30/20   [name], DO  fosinopril (MONOPRIL) 10 MG tablet Take 10 mg by mouth daily.    [provider]  insulin aspart (NOVOLOG) 100 UNIT/ML injection Inject 30-45 Units into the skin daily as needed for high blood sugar. Via pump    [provider]  lipase/protease/amylase (CREON) 36000 UNITS CPEP capsule Take 1 capsule (36,000 Units total) by mouth 3 (three) times daily before meals. 11/30/20   [name], DO  Nutritional Supplements (,FEEDING SUPPLEMENT, PROSOURCE PLUS) liquid Take 30 mLs by mouth 2 (two) times daily between meals. 11/30/20   Sheikh, [name], DO  ondansetron (ZOFRAN) 4 MG tablet Take 1 tablet (4 mg total) by mouth every 6 (six) hours as needed for nausea. 11/30/20   [name], DO  oxyCODONE (OXY IR/ROXICODONE) 5 MG immediate release tablet Take 1 tablet (5 mg total) by mouth  every 4 (four) hours as needed for severe pain or breakthrough pain. 01/30/21   Virl Cagey, MD  pantoprazole (PROTONIX) 40 MG tablet Take 40 mg by mouth every morning.     [provider]  tamsulosin (FLOMAX) 0.4 MG CAPS capsule Take 1 capsule (0.4 mg total) by mouth daily as needed. For urinary urgency after prostate radiation. Patient taking differently: Take 0.4 mg by mouth daily. 11/29/17   Alexis Frock, MD  traZODone (DESYREL) 50 MG tablet Take 50 mg by mouth at bedtime.    [provider]  Allergies    Atorvastatin, Other, Shellfish allergy, Toradol [ketorolac tromethamine], and Adhesive [tape]  Review of Systems   Review of Systems  Musculoskeletal:  Positive for arthralgias.  Neurological:  Positive for numbness (left 3,4, 5 toes).   Physical Exam Updated Vital Signs BP 114/90 (BP Location: Right Arm)   Pulse 89   Temp 98.1 F (36.7 C) (Oral)   Resp 18   Ht '6\' 1"'$  (1.854 m)   Wt 95.3 kg   SpO2 98%   BMI 27.71 kg/m   Physical Exam Constitutional:      Appearance: Normal appearance.  HENT:     Head: Atraumatic.  Eyes:     Pupils: Pupils are equal, round, and reactive to light.  Musculoskeletal:     Right lower leg: Edema present.     Left lower leg: Edema present.     Left ankle: Swelling present. No deformity. Tenderness present over the lateral malleolus.  Skin:    Findings: No bruising or wound.  Neurological:     Mental Status: He is alert.     Sensory: Sensation is intact.     Motor: Motor function is intact.    ED Results / Procedures / Treatments   Labs (all labs ordered are listed, but only abnormal results are displayed) Labs Reviewed - No data to display  EKG None  Radiology DG Ankle Complete Left  Result Date: 03/03/2021 CLINICAL DATA:  Injury, swelling EXAM: LEFT ANKLE COMPLETE - 3+ VIEW COMPARISON:  None. FINDINGS: There is an oblique fracture through the distal fibula. Minimal displacement. No tibial abnormality.  No subluxation or dislocation. IMPRESSION: Oblique minimally displaced distal fibular fracture. Electronically Signed   By: Rolm Baptise M.D.   On: 03/03/2021 00:05    Procedures Procedures   Medications Ordered in ED Medications - No data to display  ED Course  I have reviewed the triage vital signs and the nursing notes.  Pertinent labs & imaging results that were available during my care of the patient were reviewed by me and considered in my medical decision making (see chart for details).    MDM Rules/Calculators/A&P                          X-ray shows an oblique fracture through the distal fibula which explains his pain.  Will immobilize, follow-up with orthopedics.  Final Clinical Impression(s) / ED Diagnoses Final diagnoses:  Closed fracture of distal end of left fibula, unspecified fracture morphology, initial encounter    Rx / DC Orders ED Discharge Orders     None        Rhyan Wolters, Gwenyth Allegra, MD 03/03/21 0013

## 2021-03-02 NOTE — ED Triage Notes (Signed)
Pt stepped off of step stool around 1430 and "thought heard a pop" in left ankle. Pt states he had numbness in 2 toes and decided to get checked. Pain progressively worse. Swelling noted, no obvious deformity

## 2021-03-03 DIAGNOSIS — S82832A Other fracture of upper and lower end of left fibula, initial encounter for closed fracture: Secondary | ICD-10-CM | POA: Diagnosis not present

## 2021-03-03 LAB — HEMOGLOBIN A1C
Hgb A1c MFr Bld: 7.3 % — ABNORMAL HIGH (ref 4.8–5.6)
Mean Plasma Glucose: 162.81 mg/dL

## 2021-03-03 LAB — CBG MONITORING, ED
Glucose-Capillary: 174 mg/dL — ABNORMAL HIGH (ref 70–99)
Glucose-Capillary: 110 mg/dL — ABNORMAL HIGH (ref 70–99)
Glucose-Capillary: 265 mg/dL — ABNORMAL HIGH (ref 70–99)

## 2021-03-03 MED ORDER — LISINOPRIL 10 MG PO TABS
40.0000 mg | ORAL_TABLET | Freq: Every day | ORAL | Status: DC
  Administered 2021-03-03: 40 mg via ORAL
  Filled 2021-03-03: qty 4

## 2021-03-03 MED ORDER — INSULIN ASPART 100 UNIT/ML IJ SOLN
0.0000 [IU] | Freq: Three times a day (TID) | INTRAMUSCULAR | Status: DC
  Administered 2021-03-03: 3 [IU] via SUBCUTANEOUS
  Administered 2021-03-03: 8 [IU] via SUBCUTANEOUS
  Filled 2021-03-03 (×2): qty 1

## 2021-03-03 MED ORDER — TRAZODONE HCL 50 MG PO TABS
50.0000 mg | ORAL_TABLET | ORAL | Status: AC
  Administered 2021-03-03: 50 mg via ORAL
  Filled 2021-03-03: qty 1

## 2021-03-03 MED ORDER — HYDROCHLOROTHIAZIDE 12.5 MG PO CAPS
12.5000 mg | ORAL_CAPSULE | Freq: Every day | ORAL | Status: DC
  Administered 2021-03-03: 12.5 mg via ORAL
  Filled 2021-03-03: qty 1

## 2021-03-03 MED ORDER — PANTOPRAZOLE SODIUM 40 MG PO TBEC
40.0000 mg | DELAYED_RELEASE_TABLET | Freq: Every morning | ORAL | Status: DC
  Administered 2021-03-03: 40 mg via ORAL
  Filled 2021-03-03: qty 1

## 2021-03-03 MED ORDER — HYDROCODONE-ACETAMINOPHEN 5-325 MG PO TABS
1.0000 | ORAL_TABLET | ORAL | Status: DC | PRN
Start: 2021-03-03 — End: 2021-03-03
  Administered 2021-03-03 (×3): 1 via ORAL
  Filled 2021-03-03 (×3): qty 1

## 2021-03-03 MED ORDER — AMLODIPINE BESYLATE 5 MG PO TABS
10.0000 mg | ORAL_TABLET | Freq: Every morning | ORAL | Status: DC
  Administered 2021-03-03: 10 mg via ORAL
  Filled 2021-03-03: qty 2

## 2021-03-03 MED ORDER — PANCRELIPASE (LIP-PROT-AMYL) 36000-114000 UNITS PO CPEP
36000.0000 [IU] | ORAL_CAPSULE | Freq: Three times a day (TID) | ORAL | Status: DC
  Administered 2021-03-03 (×2): 36000 [IU] via ORAL
  Filled 2021-03-03 (×2): qty 1

## 2021-03-03 MED ORDER — TAMSULOSIN HCL 0.4 MG PO CAPS
0.4000 mg | ORAL_CAPSULE | ORAL | Status: AC
  Administered 2021-03-03: 0.4 mg via ORAL
  Filled 2021-03-03: qty 1

## 2021-03-03 MED ORDER — HYDROCODONE-ACETAMINOPHEN 5-325 MG PO TABS: 1.0000 | ORAL_TABLET | ORAL | 0 refills | Status: DC | PRN

## 2021-03-03 NOTE — ED Provider Notes (Signed)
Patient suffers from closed fracture of distal end of left fibula which impairs their ability to perform daily activities like ambulating in the home. A walker will not resolve issue with performing activities of daily living. A wheelchair will allow patient to safely perform daily activities. Patient can safely propel the wheelchair in the home or has a caregiver who can provide assistance.   Luna Fuse, MD 03/03/21 1058

## 2021-03-03 NOTE — ED Notes (Signed)
Patient states he is in pain but updated on pain care plan and Q4H pain medication

## 2021-03-03 NOTE — Progress Notes (Signed)
Wheelchair has been ordered through [place]. It has been delivered to pt in room. TOC signing off.

## 2021-03-03 NOTE — Progress Notes (Signed)
Inpatient Diabetes Program Recommendations  AACE/ADA: New Consensus Statement on Inpatient Glycemic Control (2015)  Target Ranges:  Prepandial:   less than 140 mg/dL      Peak postprandial:   less than 180 mg/dL (1-2 hours)      Critically ill patients:  140 - 180 mg/dL   Lab Results  Component Value Date   GLUCAP 265 (H) 03/03/2021   HGBA1C 7.3 (H) 03/03/2021    Review of Glycemic Control Results for Anthony [name], Anthony [name] (MRN [id number]) as of 03/03/2021 13:02  Ref. Range 03/03/2021 02:59 03/03/2021 08:00 03/03/2021 11:50  Glucose-Capillary Latest Ref Range: 70 - 99 mg/dL 174 (H) 110 (H) 265 (H)   Diabetes history: DM 2, Sees Dr. Chalmers Cater, Endocrinologist Outpatient Diabetes medications: OmniPod insulin pump with Novolog Settings from 11/27/20 Basal: 12A 0.2 units/hour 6A  0.3 units/hour 12P 8.0 units/hour 8P 0.25 units/hour Total basal in 24 hours: 68 units Insulin Sensitivity 1:45-50 mg/dl (1 unit drops 45-50 mg/dl) Insulin to Carb Ratio 1:12 grams (1 unit covers 12 grams of carbs) Current orders for Inpatient glycemic control:  Insulin pump on and infusing Novolog 0-15 units tid  A1c 7.3% on 8/8 Insulin pump malfunctioned 1.5 months ago pt received new pump from [place]. Pt will be seen and d/c'd from ED. Will follow.   Thanks,  Anthony Headings RN, MSN, BC-ADM Inpatient Diabetes Coordinator Team Pager [phone] (8a-5p)

## 2021-03-03 NOTE — Progress Notes (Cosign Needed Addendum)
Patient suffers from closed fracture of distal end of left fibula which impairs their ability to perform daily activities like ambulating in the home. A walker will not resolve issue with performing activities of daily living. A wheelchair will allow patient to safely perform daily activities. Patient can safely propel the wheelchair in the home or has a caregiver who can provide assistance.

## 2021-03-03 NOTE — Progress Notes (Deleted)
Patient suffers from closed fracture of distal end of left fibula which impairs their ability to perform daily activities like ambulating in  the home. A walker will not resolve issue with performing activities of daily living. A wheelchair  will allow patient to safely perform daily activities. Patient can safely propel the wheelchair in the  home or has a caregiver who can provide assistance.

## 2021-03-03 NOTE — ED Notes (Signed)
ED Provider at bedside. 

## 2021-03-03 NOTE — ED Notes (Signed)
Patient assisted in wheelchair for discharge

## 2021-03-06 ENCOUNTER — Encounter: Payer: Self-pay | Admitting: Orthopedic Surgery

## 2021-03-06 ENCOUNTER — Other Ambulatory Visit: Payer: Self-pay

## 2021-03-06 ENCOUNTER — Ambulatory Visit (INDEPENDENT_AMBULATORY_CARE_PROVIDER_SITE_OTHER): Payer: Medicare Other | Admitting: Orthopedic Surgery

## 2021-03-06 VITALS — BP 123/80 | HR 105 | Ht 73.0 in | Wt 210.0 lb

## 2021-03-06 DIAGNOSIS — W08XXXA Fall from other furniture, initial encounter: Secondary | ICD-10-CM | POA: Diagnosis not present

## 2021-03-06 DIAGNOSIS — S8265XA Nondisplaced fracture of lateral malleolus of left fibula, initial encounter for closed fracture: Secondary | ICD-10-CM

## 2021-03-06 MED ORDER — HYDROCODONE-ACETAMINOPHEN 5-325 MG PO TABS: 1.0000 | ORAL_TABLET | ORAL | 0 refills | Status: AC | PRN

## 2021-03-06 NOTE — Progress Notes (Signed)
NEW PATIENT/new problem   Chief Complaint  Patient presents with   Ankle Injury    Left ankle fracture fell on 03/01/21     75 year old male with diabetes presents after left ankle fracture.  Patient fell off a stool at his home  Complains of lateral ankle pain and swelling.  He was evaluated in the emergency room placed in a splint and sent here for follow-up   Body mass index is 27.71 kg/m.  BP 123/80   Pulse (!) 105   Ht '6\' 1"'$  (1.854 m)   Wt 210 lb (95.3 kg)   BMI 27.71 kg/m   Past Medical History:  Diagnosis Date   Agent orange exposure    Anxiety    At risk for sleep apnea    STOP BANG SCORE= 5             SENT TO PCP 11-24-2017   Chronic calcific pancreatitis ([place]) 2000   Colon cancer [facility])    09/ 2016  sigmoid cancer  s/p  left hemicolectomy (negative nodes and margins)   Depression    GERD (gastroesophageal reflux disease)    Hemorrhoids    History of adenomatous polyp of colon    Hyperlipidemia    Hyperplasia of prostate with lower urinary tract symptoms (LUTS)    Hypertension    Lipoma    Peripheral neuropathy    Prostate cancer [facility]) urologist-  dr Tresa Moore  oncologist-  dr Tammi Klippel   dx 08-02-2017-- Stage T1c, Gleason, 3+4, PSA 5.07, vol 38cc--  scheduled for radioactive seed implants 11-29-2017   Type 2 diabetes mellitus with insulin therapy ([place])    Pt reports he is Type 1 Diabetic-diagnosed at age 61/  followed by pcp   Wears glasses     Physical Exam  Constitutional: Development normal, nutrition normal, body habitus Body mass index is 27.71 kg/m.  Mental status oriented x3 mood and affect no depression Cardiovascular pulses and temperature normal   Gait crutches not doing well with those  Musculoskeletal:  Inspection: Left ankle and foot swollen previous burn unrelated tender lateral malleolus Range of motion: Decreased range of motion foot held in plantarflexion I was able to dorsiflex him with a lot of difficulty up to about  neutral Stability: Not tested Muscle strength and tone: Normal  Skin warm dry and intact no erythema ecchymosis no open areas  Neurological sensation normal  Assessment and plan:  We tried a cam walker but his foot just does not fit, I am concerned about him developing a sore from that so we put him in a short leg cast  You will get a walker from [employer] we refilled his medicine  He will follow-up in 2 weeks for cast off x-ray and skin check  Meds ordered this encounter  Medications   HYDROcodone-acetaminophen (NORCO/VICODIN) 5-325 MG tablet    Sig: Take 1 tablet by mouth every 4 (four) hours as needed for up to 7 days for moderate pain.    Dispense:  42 tablet    Refill:  0    Acute uncomplicated, outside film reading, prescription management risk of sedation constipation

## 2021-03-10 DIAGNOSIS — R1909 Other intra-abdominal and pelvic swelling, mass and lump: Secondary | ICD-10-CM | POA: Diagnosis not present

## 2021-03-10 DIAGNOSIS — K603 Anal fistula: Secondary | ICD-10-CM | POA: Diagnosis not present

## 2021-03-18 DIAGNOSIS — S8265XD Nondisplaced fracture of lateral malleolus of left fibula, subsequent encounter for closed fracture with routine healing: Secondary | ICD-10-CM | POA: Insufficient documentation

## 2021-03-20 ENCOUNTER — Other Ambulatory Visit: Payer: Self-pay

## 2021-03-20 ENCOUNTER — Encounter: Payer: Self-pay | Admitting: Orthopedic Surgery

## 2021-03-20 ENCOUNTER — Ambulatory Visit (INDEPENDENT_AMBULATORY_CARE_PROVIDER_SITE_OTHER): Payer: Medicare Other | Admitting: Orthopedic Surgery

## 2021-03-20 ENCOUNTER — Ambulatory Visit: Payer: Medicare Other

## 2021-03-20 VITALS — Ht 73.0 in | Wt 210.0 lb

## 2021-03-20 DIAGNOSIS — K861 Other chronic pancreatitis: Secondary | ICD-10-CM | POA: Insufficient documentation

## 2021-03-20 DIAGNOSIS — Z0000 Encounter for general adult medical examination without abnormal findings: Secondary | ICD-10-CM | POA: Insufficient documentation

## 2021-03-20 DIAGNOSIS — S8265XD Nondisplaced fracture of lateral malleolus of left fibula, subsequent encounter for closed fracture with routine healing: Secondary | ICD-10-CM | POA: Diagnosis not present

## 2021-03-20 DIAGNOSIS — Z85038 Personal history of other malignant neoplasm of large intestine: Secondary | ICD-10-CM | POA: Insufficient documentation

## 2021-03-20 DIAGNOSIS — C187 Malignant neoplasm of sigmoid colon: Secondary | ICD-10-CM | POA: Insufficient documentation

## 2021-03-20 DIAGNOSIS — K759 Inflammatory liver disease, unspecified: Secondary | ICD-10-CM | POA: Insufficient documentation

## 2021-03-20 DIAGNOSIS — E1142 Type 2 diabetes mellitus with diabetic polyneuropathy: Secondary | ICD-10-CM | POA: Insufficient documentation

## 2021-03-20 DIAGNOSIS — Z888 Allergy status to other drugs, medicaments and biological substances status: Secondary | ICD-10-CM | POA: Insufficient documentation

## 2021-03-20 DIAGNOSIS — I10 Essential (primary) hypertension: Secondary | ICD-10-CM | POA: Insufficient documentation

## 2021-03-20 DIAGNOSIS — E1165 Type 2 diabetes mellitus with hyperglycemia: Secondary | ICD-10-CM | POA: Insufficient documentation

## 2021-03-20 DIAGNOSIS — E10319 Type 1 diabetes mellitus with unspecified diabetic retinopathy without macular edema: Secondary | ICD-10-CM | POA: Insufficient documentation

## 2021-03-20 DIAGNOSIS — G609 Hereditary and idiopathic neuropathy, unspecified: Secondary | ICD-10-CM | POA: Insufficient documentation

## 2021-03-20 DIAGNOSIS — E782 Mixed hyperlipidemia: Secondary | ICD-10-CM | POA: Insufficient documentation

## 2021-03-20 DIAGNOSIS — K649 Unspecified hemorrhoids: Secondary | ICD-10-CM | POA: Insufficient documentation

## 2021-03-20 DIAGNOSIS — R972 Elevated prostate specific antigen [PSA]: Secondary | ICD-10-CM | POA: Insufficient documentation

## 2021-03-20 DIAGNOSIS — R749 Abnormal serum enzyme level, unspecified: Secondary | ICD-10-CM | POA: Insufficient documentation

## 2021-03-20 MED ORDER — OXYCODONE HCL 5 MG PO TABS: 5.0000 mg | ORAL_TABLET | ORAL | 0 refills | Status: DC | PRN

## 2021-03-20 NOTE — Progress Notes (Addendum)
Chief Complaint  Patient presents with   Fracture    Lt ankle DOS 03/01/21   Medication Refill    Oxycodone     75 year old male with diabetes has a left ankle fracture after falling off a stool at his home we put him on hydrocodone we put him in a cast for 2 weeks and had him come back to get his skin checked he got a new walker from [employer] he is here to get a skin check today  The patient's skin is intact  His x-ray shows the fracture is stable and healing  We tried several cam walkers and settled on a short cam walking boot to allow for swelling  He will come back in 3 to 4 weeks for repeat x-ray and he got a medication refill today  Meds ordered this encounter  Medications   oxyCODONE (OXY IR/ROXICODONE) 5 MG immediate release tablet    Sig: Take 1 tablet (5 mg total) by mouth every 4 (four) hours as needed for severe pain or breakthrough pain.    Dispense:  10 tablet    Refill:  0    Please call if prescription not available. Dr. Constance Haw- [phone]    Note to self need to address oxycodone usage  Addendum: ambulatory patient

## 2021-04-02 DIAGNOSIS — E1165 Type 2 diabetes mellitus with hyperglycemia: Secondary | ICD-10-CM | POA: Diagnosis not present

## 2021-04-02 DIAGNOSIS — E7801 Familial hypercholesterolemia: Secondary | ICD-10-CM | POA: Diagnosis not present

## 2021-04-07 DIAGNOSIS — K603 Anal fistula: Secondary | ICD-10-CM | POA: Diagnosis not present

## 2021-04-09 DIAGNOSIS — I10 Essential (primary) hypertension: Secondary | ICD-10-CM | POA: Diagnosis not present

## 2021-04-09 DIAGNOSIS — K760 Fatty (change of) liver, not elsewhere classified: Secondary | ICD-10-CM | POA: Diagnosis not present

## 2021-04-09 DIAGNOSIS — Z23 Encounter for immunization: Secondary | ICD-10-CM | POA: Diagnosis not present

## 2021-04-09 DIAGNOSIS — E10319 Type 1 diabetes mellitus with unspecified diabetic retinopathy without macular edema: Secondary | ICD-10-CM | POA: Diagnosis not present

## 2021-04-09 DIAGNOSIS — G720 Drug-induced myopathy: Secondary | ICD-10-CM | POA: Diagnosis not present

## 2021-04-09 DIAGNOSIS — E782 Mixed hyperlipidemia: Secondary | ICD-10-CM | POA: Diagnosis not present

## 2021-04-09 DIAGNOSIS — T466X5A Adverse effect of antihyperlipidemic and antiarteriosclerotic drugs, initial encounter: Secondary | ICD-10-CM | POA: Diagnosis not present

## 2021-04-09 DIAGNOSIS — N182 Chronic kidney disease, stage 2 (mild): Secondary | ICD-10-CM | POA: Diagnosis not present

## 2021-04-09 DIAGNOSIS — G609 Hereditary and idiopathic neuropathy, unspecified: Secondary | ICD-10-CM | POA: Diagnosis not present

## 2021-04-10 ENCOUNTER — Other Ambulatory Visit: Payer: Self-pay | Admitting: Orthopedic Surgery

## 2021-04-10 DIAGNOSIS — S8265XD Nondisplaced fracture of lateral malleolus of left fibula, subsequent encounter for closed fracture with routine healing: Secondary | ICD-10-CM

## 2021-04-10 NOTE — Telephone Encounter (Signed)
Patient requests  prescription for Oxycodone 5 mgs.  Sig: Take 1 tablet (5 mg total) by mouth every 4 (four) hours as needed for severe pain or breakthrough pain.  Patient states he uses [facility] in [place]

## 2021-04-10 NOTE — Telephone Encounter (Signed)
Fracture ankle 03/01/21

## 2021-04-14 ENCOUNTER — Ambulatory Visit (INDEPENDENT_AMBULATORY_CARE_PROVIDER_SITE_OTHER): Payer: Medicare Other | Admitting: Orthopedic Surgery

## 2021-04-14 ENCOUNTER — Other Ambulatory Visit: Payer: Self-pay

## 2021-04-14 ENCOUNTER — Ambulatory Visit: Payer: Medicare Other

## 2021-04-14 ENCOUNTER — Encounter: Payer: Self-pay | Admitting: Orthopedic Surgery

## 2021-04-14 DIAGNOSIS — S8265XD Nondisplaced fracture of lateral malleolus of left fibula, subsequent encounter for closed fracture with routine healing: Secondary | ICD-10-CM

## 2021-04-14 DIAGNOSIS — N182 Chronic kidney disease, stage 2 (mild): Secondary | ICD-10-CM | POA: Insufficient documentation

## 2021-04-14 DIAGNOSIS — E7800 Pure hypercholesterolemia, unspecified: Secondary | ICD-10-CM | POA: Insufficient documentation

## 2021-04-14 MED ORDER — OXYCODONE-ACETAMINOPHEN 5-325 MG PO TABS
1.0000 | ORAL_TABLET | Freq: Four times a day (QID) | ORAL | 0 refills | Status: AC | PRN
Start: 2021-04-14 — End: 2021-04-19

## 2021-04-14 NOTE — Progress Notes (Signed)
Encounter Diagnosis  Name Primary?   Nondisplaced fracture of lateral malleolus of left fibula, subsequent encounter for closed fracture with routine healing Yes   Chief Complaint  Patient presents with   Ankle Injury    Left ankle fracture 03/02/21   Week 6   C/o pain at night   Amb w crutches and cam walker   Xr in 4 weeks   Meds ordered this encounter  Medications   oxyCODONE-acetaminophen (PERCOCET) 5-325 MG tablet    Sig: Take 1 tablet by mouth every 6 (six) hours as needed for up to 5 days for severe pain. Must last 20 days    Dispense:  20 tablet    Refill:  0

## 2021-04-28 DIAGNOSIS — H353131 Nonexudative age-related macular degeneration, bilateral, early dry stage: Secondary | ICD-10-CM | POA: Diagnosis not present

## 2021-04-28 DIAGNOSIS — H40023 Open angle with borderline findings, high risk, bilateral: Secondary | ICD-10-CM | POA: Diagnosis not present

## 2021-04-28 DIAGNOSIS — H31092 Other chorioretinal scars, left eye: Secondary | ICD-10-CM | POA: Diagnosis not present

## 2021-04-28 DIAGNOSIS — E113293 Type 2 diabetes mellitus with mild nonproliferative diabetic retinopathy without macular edema, bilateral: Secondary | ICD-10-CM | POA: Diagnosis not present

## 2021-04-30 DIAGNOSIS — R1909 Other intra-abdominal and pelvic swelling, mass and lump: Secondary | ICD-10-CM | POA: Diagnosis not present

## 2021-05-02 ENCOUNTER — Other Ambulatory Visit: Payer: Self-pay | Admitting: Surgery

## 2021-05-02 DIAGNOSIS — R1909 Other intra-abdominal and pelvic swelling, mass and lump: Secondary | ICD-10-CM

## 2021-05-05 DIAGNOSIS — E1165 Type 2 diabetes mellitus with hyperglycemia: Secondary | ICD-10-CM | POA: Diagnosis not present

## 2021-05-05 DIAGNOSIS — I10 Essential (primary) hypertension: Secondary | ICD-10-CM | POA: Diagnosis not present

## 2021-05-05 DIAGNOSIS — G609 Hereditary and idiopathic neuropathy, unspecified: Secondary | ICD-10-CM | POA: Diagnosis not present

## 2021-05-05 DIAGNOSIS — Z9641 Presence of insulin pump (external) (internal): Secondary | ICD-10-CM | POA: Diagnosis not present

## 2021-05-05 DIAGNOSIS — E782 Mixed hyperlipidemia: Secondary | ICD-10-CM | POA: Diagnosis not present

## 2021-05-12 ENCOUNTER — Encounter: Payer: Medicare Other | Admitting: Orthopedic Surgery

## 2021-05-15 ENCOUNTER — Encounter: Payer: Medicare Other | Admitting: Orthopedic Surgery

## 2021-05-19 ENCOUNTER — Ambulatory Visit: Payer: Medicare Other

## 2021-05-19 ENCOUNTER — Other Ambulatory Visit: Payer: Self-pay

## 2021-05-19 ENCOUNTER — Ambulatory Visit (INDEPENDENT_AMBULATORY_CARE_PROVIDER_SITE_OTHER): Payer: Medicare Other | Admitting: Orthopedic Surgery

## 2021-05-19 ENCOUNTER — Encounter: Payer: Self-pay | Admitting: Orthopedic Surgery

## 2021-05-19 DIAGNOSIS — S8265XD Nondisplaced fracture of lateral malleolus of left fibula, subsequent encounter for closed fracture with routine healing: Secondary | ICD-10-CM

## 2021-05-19 NOTE — Progress Notes (Signed)
Chief Complaint  Patient presents with   Ankle Injury    03/02/21 left ankle fracture    Encounter Diagnosis  Name Primary?   Nondisplaced fracture of lateral malleolus of left fibula, subsequent encounter for closed fracture with routine healing Yes   75 year old male left ankle fracture treated nonsurgically this is day #78.  He has good pain control now.  His ankle looks good except for some swelling  His x-ray shows fracture healing intact mortise  Cam walker for treatment  Recommend he start weaning process and see me in 3 months

## 2021-05-20 DIAGNOSIS — M4712 Other spondylosis with myelopathy, cervical region: Secondary | ICD-10-CM | POA: Diagnosis not present

## 2021-05-20 DIAGNOSIS — M4313 Spondylolisthesis, cervicothoracic region: Secondary | ICD-10-CM | POA: Diagnosis not present

## 2021-05-22 ENCOUNTER — Ambulatory Visit
Admission: RE | Admit: 2021-05-22 | Discharge: 2021-05-22 | Disposition: A | Discharge status: Home / Self Care | Payer: Medicare Other | Source: Ambulatory Visit | Attending: Surgery | Admitting: Surgery

## 2021-05-22 DIAGNOSIS — Z85038 Personal history of other malignant neoplasm of large intestine: Secondary | ICD-10-CM | POA: Diagnosis not present

## 2021-05-22 DIAGNOSIS — K610 Anal abscess: Secondary | ICD-10-CM | POA: Diagnosis not present

## 2021-05-22 DIAGNOSIS — R1909 Other intra-abdominal and pelvic swelling, mass and lump: Secondary | ICD-10-CM

## 2021-05-22 DIAGNOSIS — N433 Hydrocele, unspecified: Secondary | ICD-10-CM | POA: Diagnosis not present

## 2021-05-22 DIAGNOSIS — Z8546 Personal history of malignant neoplasm of prostate: Secondary | ICD-10-CM | POA: Diagnosis not present

## 2021-05-22 IMAGING — MR MR PELVIS WO/W CM
16 series · 48 of 48 positions shown · IV contrast (MULTIHANCE)
Comparison: Multiple exams, including [DATE]

CLINICAL DATA: Right groin mass, history of prostate and colon
cancer.

EXAM:
MRI PELVIS WITHOUT AND WITH CONTRAST
TECHNIQUE: Multiplanar multisequence MR imaging of the pelvis was performed
both before and after administration of intravenous contrast.
CONTRAST:  20mL MULTIHANCE GADOBENATE DIMEGLUMINE 529 MG/ML IV SOLN

[Series 2: T2 · coronal · 5.0mm · 1.56mm/px · 2 of 44 slices shown (1 of 2)]
[im 1/44]
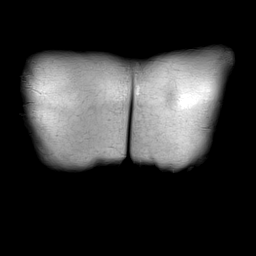
[im 44/44]
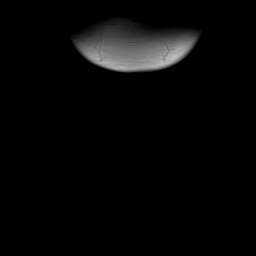

[Series 3: T2 · axial · 6.0mm · 1.25mm/px · 1 of 50 slices shown (2 of 2)]
[im 1/50]
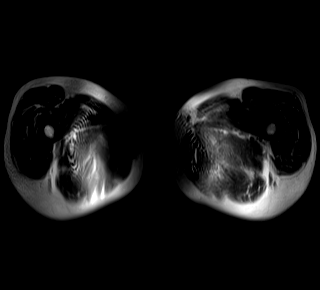

[Series 4: T1 · axial · 3.0mm · 1.31mm/px · z∈[-157,+176]mm · 7 of 224 slices shown]
[im 1/224]
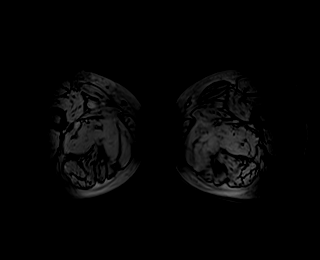
[im 38/224]
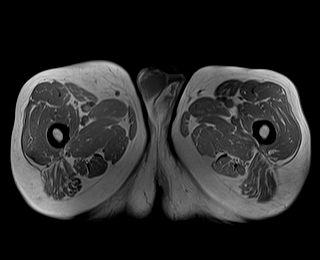
[im 75/224]
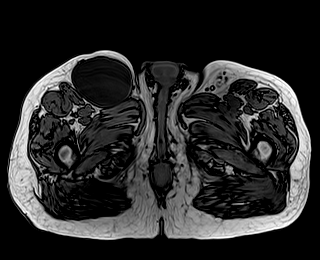
[im 112/224]
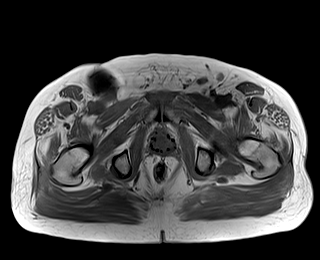
[im 149/224]
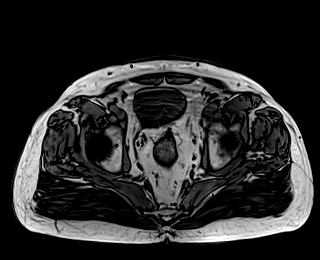
[im 186/224]
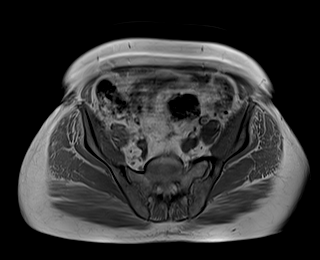
[im 224/224]
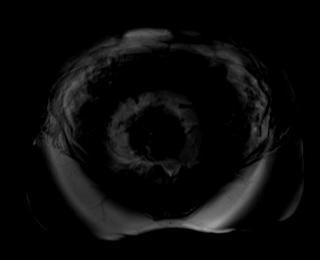

[Series 5: T2 fat-sat · axial · 5.0mm · 1.64mm/px · z∈[-168,+186]mm · 2 of 60 slices shown]
[im 1/60]
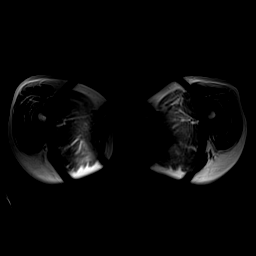
[im 60/60]
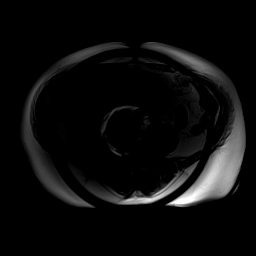

[Series 6: DWI · axial · 5.0mm · 1.49mm/px · z∈[-140,+152]mm · 3 of 120 slices shown (1 of 2)]
[im 1/120]
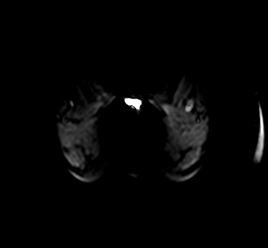
[im 60/120]
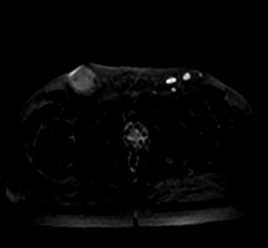
[im 120/120]
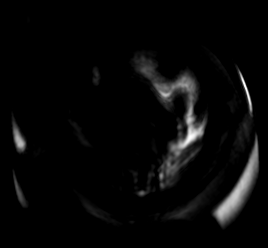

[Series 7: DWI · axial · 5.0mm · 1.49mm/px · 1 of 40 slices shown (2 of 2)]
[im 1/40]
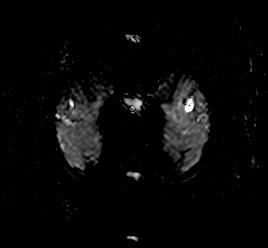

[Series 8: T1 dynamic · axial · non-contrast · 3.5mm · 1.31mm/px · z∈[-148,+212]mm · 3 of 104 slices shown]
[im 1/104]
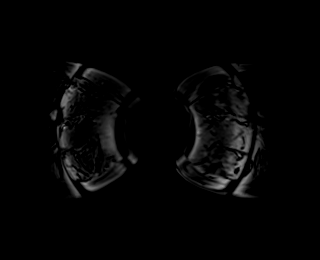
[im 52/104]
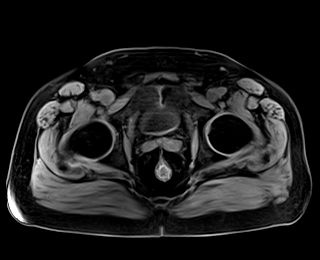
[im 104/104]
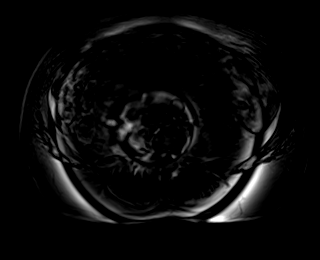

[Series 10: T1 dynamic post-contrast · axial · 3.5mm · 1.31mm/px · z∈[-148,+212]mm · 3 of 104 slices shown (1 of 6)]
[im 1/104]
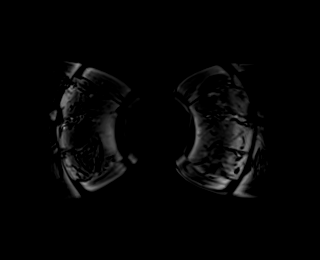
[im 52/104]
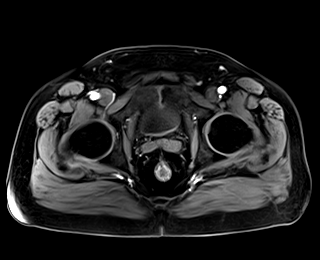
[im 104/104]
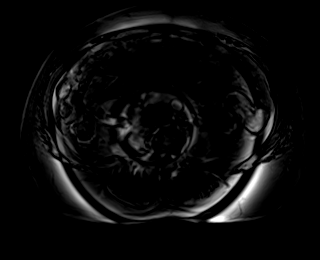

[Series 11: T1 dynamic post-contrast · axial · 3.5mm · 1.31mm/px · z∈[-148,+212]mm · 3 of 104 slices shown (2 of 6)]
[im 1/104]
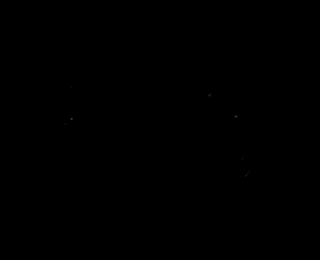
[im 52/104]
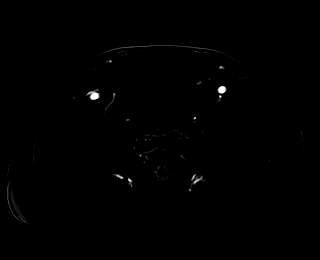
[im 104/104]
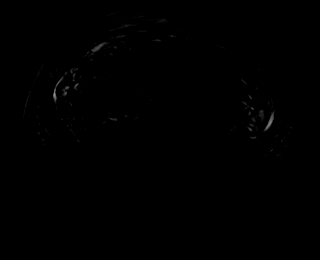

[Series 12: T1 dynamic post-contrast · axial · 3.5mm · 1.31mm/px · z∈[-148,+212]mm · 3 of 104 slices shown (3 of 6)]
[im 1/104]
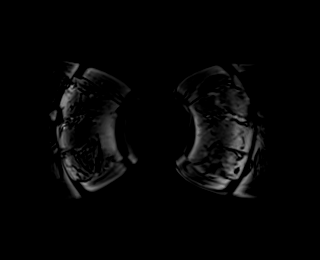
[im 52/104]
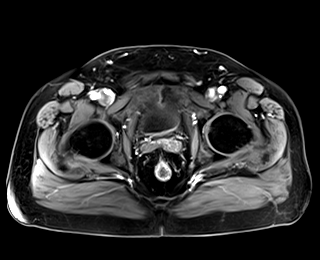
[im 104/104]
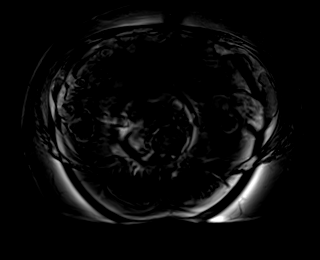

[Series 13: T1 dynamic post-contrast · axial · 3.5mm · 1.31mm/px · z∈[-148,+212]mm · 3 of 104 slices shown (4 of 6)]
[im 1/104]
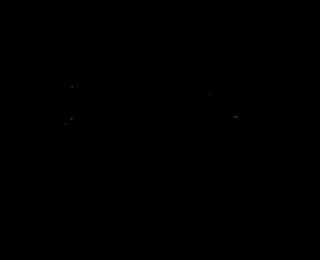
[im 52/104]
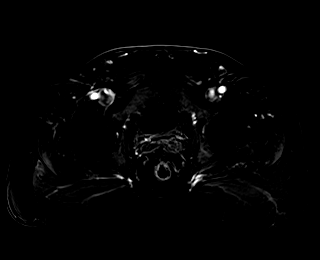
[im 104/104]
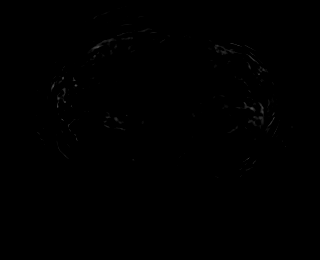

[Series 14: T1 fat-sat post-contrast · axial · 2.4mm · 1.25mm/px · z∈[-163,+181]mm · 4 of 144 slices shown (1 of 2)]
[im 1/144]
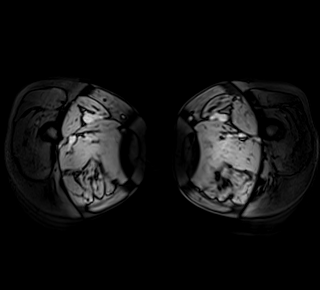
[im 48/144]
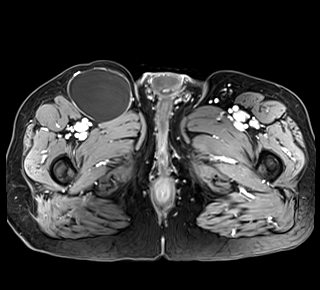
[im 96/144]
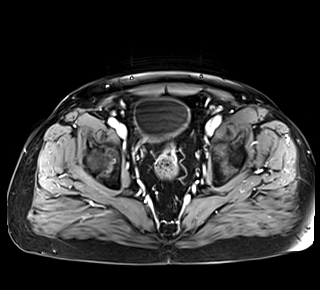
[im 144/144]
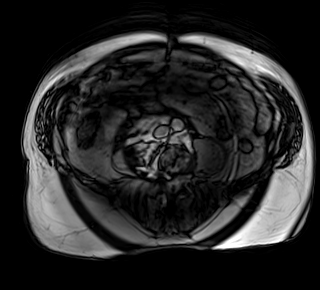

[Series 15: T1 fat-sat · axial · 2.4mm · 1.25mm/px · z∈[-163,+181]mm · 4 of 144 slices shown]
[im 1/144]
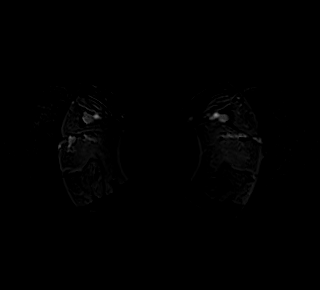
[im 48/144]
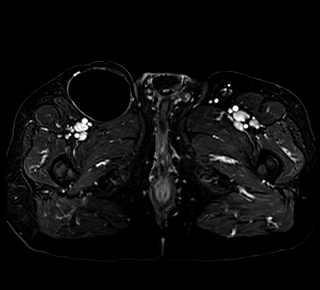
[im 96/144]
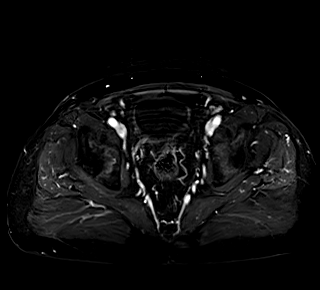
[im 144/144]
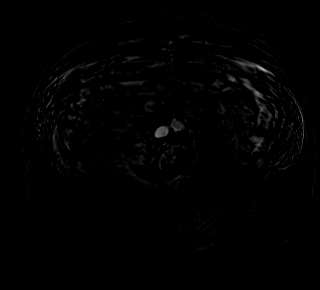

[Series 16: T1 dynamic post-contrast · axial · 3.5mm · 1.31mm/px · z∈[-148,+212]mm · 3 of 104 slices shown (5 of 6)]
[im 1/104]
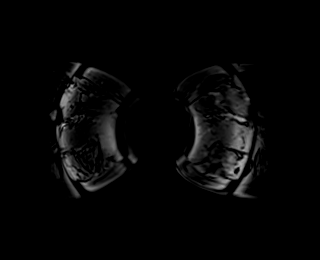
[im 52/104]
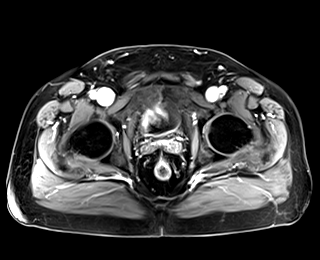
[im 104/104]
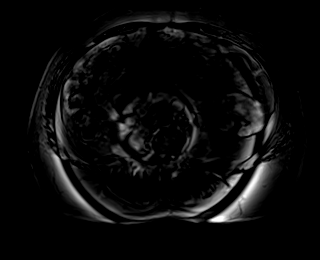

[Series 17: T1 dynamic post-contrast · axial · 3.5mm · 1.31mm/px · z∈[-148,+212]mm · 3 of 104 slices shown (6 of 6)]
[im 1/104]
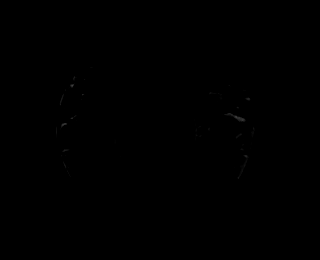
[im 52/104]
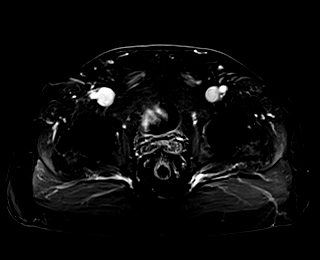
[im 104/104]
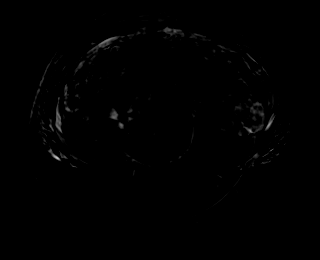

[Series 18: T1 fat-sat post-contrast · coronal · 3.0mm · 1.25mm/px · 3 of 96 slices shown (2 of 2)]
[im 1/96]
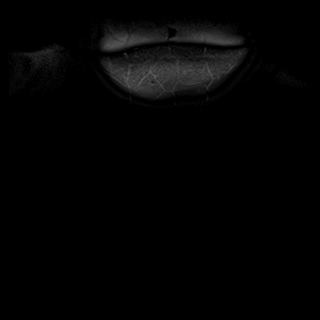
[im 48/96]
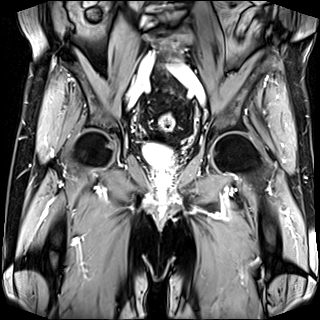
[im 96/96]
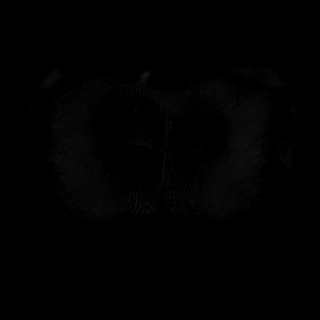

[48 of 48 positions shown; findings below may reference images not displayed]

FINDINGS: Urinary Tract: The distal ureters in urinary bladder appear
unremarkable.

Bowel: No complicating feature along the sigmoid colon anastomosis
is currently identified.

Today's exam was not performed using the perianal fistula protocol.
However, on images 104 through 120 of series 14 there is thought to
be a residual left distal perianal fistula extending to the
cutaneous surface of the left gluteal cleft. The abscess shown on
[DATE] has resolved and there is substantially less
inflammation.

Vascular/Lymphatic: Again observed is a cystic right groin lesion
with a thin margin of enhancement. Small confluent superficial
venous structures are present along the anterior inferior margin of
this process, and the cystic lesion mildly effaces the greater
saphenous vein which extends along the medial margin of the lesion.
This lesion currently measures 8.0 by 6.6 by 9.7 cm (volume = 270
cm^3), and on [DATE] this measured 200 cc. The lesion measured
200 cc on [DATE] and back on [DATE] this lesion measured 12
cc.

Top differential diagnostic considerations include lymphangioma,
seroma, ganglion cyst, or cystic degeneration of a lymph node.

On axial image 11 of series 6, a 1.0 by 0.5 by 0.9 cm (volume =
cm^3) cystic lesion is present adjacent to the right common iliac
bifurcation, and likewise could reflect a small lymphangioma,
ganglion cyst, or cystic degeneration of a lymph node.

No pathologic solid adenopathy is identified in the pelvis.

Reproductive: Brachytherapy seed implants noted in the prostate
gland, which is not enlarged. Small bilateral scrotal hydroceles.

Other:  No supplemental non-categorized findings.

Musculoskeletal: There is some nonspecific reduction in fat signal
in the left iliac crest compared to the right, corresponding to the
sclerosis seen on prior CT scan. This remains nonspecific. If PSA
levels indicate the possibility of prostate metastatic disease, this
could be further worked up with a whole-body bone scan.
IMPRESSION: 1. Slowly enlarging cystic lesion of the right groin. This mildly
effaces the greater saphenous vein which runs along the medial
margin of the lesion. Lesion is currently about 270 cc, and measured
200 cc on [DATE] and on [DATE]. Top differential diagnostic
considerations include lymphangioma, seroma, cystic degeneration of
a lymph node, or less likely ganglion cyst. Sampling or excision
would likely be feasible. There are some small superficial venous
structures along the anterior inferior margin of this lesion.
2. There is also a small (0.2 cm) cystic lesion adjacent to the
right common iliac bifurcation, likely incidental.
3. Small left eccentric perianal abscess. This is substantially
improved compared to the prior CT scan of [DATE], and no abscess
is identified.
4. Subtle asymmetry in the adipose tissue along the left iliac crest
corresponding to the faint sclerosis seen on recent CT, this remains
nonspecific.
5. Brachytherapy seed implants in the prostate gland.
6. Small bilateral scrotal hydroceles.

## 2021-05-22 MED ORDER — GADOBENATE DIMEGLUMINE 529 MG/ML IV SOLN
20.0000 mL | Freq: Once | INTRAVENOUS | Status: AC | PRN
  Administered 2021-05-22: 20 mL via INTRAVENOUS

## 2021-06-03 DIAGNOSIS — Z9641 Presence of insulin pump (external) (internal): Secondary | ICD-10-CM | POA: Diagnosis not present

## 2021-06-03 DIAGNOSIS — E1165 Type 2 diabetes mellitus with hyperglycemia: Secondary | ICD-10-CM | POA: Diagnosis not present

## 2021-06-09 DIAGNOSIS — K603 Anal fistula: Secondary | ICD-10-CM | POA: Diagnosis not present

## 2021-06-16 DIAGNOSIS — K603 Anal fistula: Secondary | ICD-10-CM | POA: Diagnosis not present

## 2021-06-16 DIAGNOSIS — R1909 Other intra-abdominal and pelvic swelling, mass and lump: Secondary | ICD-10-CM | POA: Diagnosis not present

## 2021-07-02 DIAGNOSIS — Z23 Encounter for immunization: Secondary | ICD-10-CM | POA: Diagnosis not present

## 2021-07-25 NOTE — Progress Notes (Signed)
[facility] Note  08/01/2021     CHIEF COMPLAINT Patient presents for Retina Follow Up  HISTORY OF PRESENT ILLNESS: Anthony [name] is a 75 y.o. male who presents to the clinic today for:   HPI     Retina Follow Up   Patient presents with  Dry AMD.  In both eyes.  This started years ago.  Severity is mild.  Duration of 6 months.  Since onset it is stable.  I, the attending physician,  performed the HPI with the patient and updated documentation appropriately.        Comments   75 y/o male pt here for 6 mo f/u for CR scarring OS/dry ARMD OU.  No change in [place] OU.  Denies pain, FOL, floaters.  AT prn OU.  BS 115 today.  A1C 8.0.      Last edited by Bernarda Caffey, MD on 08/03/2021  2:04 PM.    Pt feels his vision is slowly deteriorating due to age.  Referring physician: Merrilee Seashore, [address] [place],  [place] [address]  HISTORICAL INFORMATION:   Selected notes from the MEDICAL RECORD NUMBER Referred by Dr. Kathlen Mody for cat sx clearance   CURRENT MEDICATIONS: No current outpatient medications on file. (Ophthalmic Drugs)   No current facility-administered medications for this visit. (Ophthalmic Drugs)   Current Outpatient Medications (Other)  Medication Sig   amLODipine (NORVASC) 10 MG tablet Take 10 mg by mouth every morning.    cholecalciferol (VITAMIN D3) 25 MCG (1000 UNIT) tablet Take 1,000 Units by mouth daily.   Evolocumab (REPATHA) 140 MG/ML SOSY Inject 140 mg into the skin every 14 (fourteen) days.   fosinopril (MONOPRIL) 10 MG tablet Take 10 mg by mouth daily.   furosemide (LASIX) 40 MG tablet Take 1 tablet by mouth once daily for 7 days   hydrochlorothiazide (HYDRODIURIL) 25 MG tablet 0.5 tablet   insulin aspart (NOVOLOG) 100 UNIT/ML injection Inject 30-45 Units into the skin daily as needed for high blood sugar. Via pump   LAGEVRIO 200 MG CAPS    lipase/protease/amylase (CREON) 36000 UNITS CPEP capsule Take 1  capsule (36,000 Units total) by mouth 3 (three) times daily before meals.   lisinopril (ZESTRIL) 10 MG tablet Take 10 mg by mouth daily.   oxyCODONE (OXY IR/ROXICODONE) 5 MG immediate release tablet Take 1 tablet (5 mg total) by mouth every 4 (four) hours as needed for severe pain or breakthrough pain.   pantoprazole (PROTONIX) 40 MG tablet Take 40 mg by mouth every morning.    tamsulosin (FLOMAX) 0.4 MG CAPS capsule Take 1 capsule (0.4 mg total) by mouth daily as needed. For urinary urgency after prostate radiation. (Patient taking differently: Take 0.4 mg by mouth daily.)   traZODone (DESYREL) 50 MG tablet Take 50 mg by mouth at bedtime.   No current facility-administered medications for this visit. (Other)   REVIEW OF SYSTEMS: ROS   Positive for: Gastrointestinal, Genitourinary, Endocrine, Eyes Negative for: Constitutional, Neurological, Skin, Musculoskeletal, HENT, Cardiovascular, Respiratory, Psychiatric, Allergic/Imm, Heme/Lymph Last edited by Matthew Folks, COA on 08/01/2021 12:28 PM.     ALLERGIES Allergies  Allergen Reactions   Peanut Oil Anaphylaxis    Other reaction(s): anaphylaxis   Almond Oil Other (See Comments)    Positive allergy test   Shellfish Allergy Other (See Comments)    Positive allergy test Positive allergy test Positive allergy test   Atorvastatin Other (See Comments)    Reports elevated liver  enzymes.   Other Other (See Comments)    Positive allergy test for peanuts and almonds   Silver Other (See Comments)   Toradol [Ketorolac Tromethamine] Other (See Comments)    Pt has chronic calcific pancreatitis   Tape Rash    Tegaderm  Tegaderm     PAST MEDICAL HISTORY Past Medical History:  Diagnosis Date   Agent orange exposure    Anxiety    At risk for sleep apnea    STOP BANG SCORE= 5             SENT TO PCP 11-24-2017   Chronic calcific pancreatitis ([place]) 2000   Colon cancer ([place])    09/ 2016  sigmoid cancer  s/p  left hemicolectomy (negative  nodes and margins)   Depression    GERD (gastroesophageal reflux disease)    Hemorrhoids    History of adenomatous polyp of colon    Hyperlipidemia    Hyperplasia of prostate with lower urinary tract symptoms (LUTS)    Hypertension    Hypertensive retinopathy    Lipoma    Macular degeneration    Peripheral neuropathy    Prostate cancer [facility]) urologist-  dr Tresa Moore  oncologist-  dr Tammi Klippel   dx 08-02-2017-- Stage T1c, Gleason, 3+4, PSA 5.07, vol 38cc--  scheduled for radioactive seed implants 11-29-2017   Type 2 diabetes mellitus with insulin therapy ([place])    Pt reports he is Type 1 Diabetic-diagnosed at age 5/  followed by pcp   Wears glasses    Past Surgical History:  Procedure Laterality Date   CATARACT EXTRACTION     COLONOSCOPY  last one 03-14-2015   CYSTOSCOPY N/A 11/29/2017   Procedure: CYSTOSCOPY;  Surgeon: Alexis Frock, MD;  Location: [facility];  Service: Urology;  Laterality: N/A;  no seeds in bladder per Dr Tresa Moore   EVALUATION UNDER ANESTHESIA WITH HEMORRHOIDECTOMY N/A 05/29/2015   Procedure: ANAL EXAM UNDER ANESTHESIA WITH  HEMORRHOIDOPEXY; RIGID SIGMOIDOSCOPY;  Surgeon: Leighton Ruff, MD;  Location: [facility];  Service: General;  Laterality: N/A;   EYE SURGERY     INCISION AND DRAINAGE PERIRECTAL ABSCESS N/A 11/27/2020   Procedure: IRRIGATION AND DEBRIDEMENT PERIRECTAL ABSCESS with drain placement and seton placement;  Surgeon: Virl Cagey, MD;  Location: [facility];  Service: General;  Laterality: N/A;   LAPAROSCOPIC PARTIAL COLECTOMY N/A 03/29/2015   Procedure: LAPAROSCOPIC SIGMOIDECTOMY;  Surgeon: Leighton Ruff, MD;  Location: [facility];  Service: General;  Laterality: N/A;   NASAL SEPTUM SURGERY  2006   PROSTATE BIOPSY  08-02-2017  dr Tresa Moore office   RADIOACTIVE SEED IMPLANT N/A 11/29/2017   Procedure: RADIOACTIVE SEED IMPLANT/BRACHYTHERAPY IMPLANT;  Surgeon: Alexis Frock, MD;  Location: [facility];  Service:  Urology;  Laterality: N/A;   SPACE OAR INSTILLATION N/A 11/29/2017   Procedure: SPACE OAR INSTILLATION;  Surgeon: Alexis Frock, MD;  Location: [facility];  Service: Urology;  Laterality: N/A;   FAMILY HISTORY Family History  Problem Relation Age of Onset   Hyperlipidemia Father    Stroke Father    Prostate cancer Father    Heart attack Father    Diabetes Maternal Uncle    Cancer Brother        unknown blood cancer   Healthy Mother    SOCIAL HISTORY Social History   Tobacco Use   Smoking status: Former    Years: 30.00    Types: Cigarettes    Quit date: 06/26/2009    Years since  quitting: 12.1   Smokeless tobacco: Never  Vaping Use   Vaping Use: Never used  Substance Use Topics   Alcohol use: Not Currently   Drug use: No       OPHTHALMIC EXAM:  Base Eye Exam     Visual Acuity (Snellen - Linear)       Right Left   Dist [place] 20/20 - 20/20 -2         Tonometry (Tonopen, 12:35 PM)       Right Left   Pressure 13 12         Pupils       Dark Light Shape React APD   Right 3 2 Round Minimal None   Left 3 2 Round Minimal None         Visual Fields (Counting fingers)       Left Right    Full Full         Extraocular Movement       Right Left    Full, Ortho Full, Ortho         Neuro/Psych     Oriented x3: Yes   Mood/Affect: Normal         Dilation     Both eyes: 1.0% Mydriacyl, 2.5% Phenylephrine @ 12:35 PM           Slit Lamp and Fundus Exam     Slit Lamp Exam       Right Left   Lids/Lashes Dermatochalasis - upper lid, Ptosis Dermatochalasis - upper lid, mild Ptosis   Conjunctiva/Sclera Mild Melanosis, inferior scleral show Melanosis, temporal pinguecula, inferior scleral show   Cornea Arcus, well healed cataract wounds Arcus, well healed cataract wounds   Anterior Chamber deep and clear deep and clear   Iris Round and moderately dilated, TIDS @ 0800 and 0830--?LPI Round and moderately dilated   Lens PC IOL  in excellent position PC IOL in excellent poistion   Anterior Vitreous Vitreous syneresis Vitreous syneresis         Fundus Exam       Right Left   Disc Trace pallor, sharp rim Mild central pallor, Sharp, +cupping   C/D Ratio 0.6 0.7   Macula good foveal reflex, Retinal pigment epithelial mottling, scattered Drusen, No heme.  Focal shallow SRF superior macula - slightly increased from prior. good foveal reflex, Retinal pigment epithelial mottling and clumping, Drusen, No heme or edema, RPE atrophy and mild ERM   Vessels Vascular attenuation, mild Copper wiring, mild Tortuousity, AV crossing changes Vascular attenuation, mild Copper wiring, Tortuous, mild AV crossing changes   Periphery Attached, scattered RPE changes, peripheral drusen, No heme Attached, focal patch of CR scarring just outside IT arcades and inferiorly -- stable, scattered peripheral drusen, No heme            IMAGING AND PROCEDURES  Imaging and Procedures for @TODAY @  OCT, Retina - OU - Both Eyes       Right Eye Quality was good. Central Foveal Thickness: 254. Progression has worsened. Findings include no IRF, retinal drusen , vitreomacular adhesion , normal foveal contour, pigment epithelial detachment, subretinal fluid (Interval increase in focal SRF superior to fovea, scattered small PED's, Mild scattered drusen, Focal ellipsoid disruption nasal macula -- stable).   Left Eye Quality was good. Central Foveal Thickness: 246. Progression has been stable. Findings include normal foveal contour, no IRF, no SRF, vitreomacular adhesion , pigment epithelial detachment, outer retinal atrophy, retinal drusen (Focal ellipsoid disruption IN and temporal macula,  larger geographic area of inner and outer retinal atrophy IT midzone caught on widefield -- stable from prior, rare drusen).   Notes *Images captured and stored on drive  Diagnosis / Impression:  NFP, no IRF OU +retinal drusen OU -- non-exudative ARMD OD:  Interval increase in focal SRF superior to fovea, scattered small PED's,  Mild scattered drusen, Focal ellipsoid disruption nasal macula -- stable OS: Focal ellipsoid disruption IN and temporal macula, larger geographic area of inner and outer retinal atrophy IT midzone caught on widefield -- stable from prior, rare drusen  Clinical management:  See below  Abbreviations: NFP - Normal foveal profile. CME - cystoid macular edema. PED - pigment epithelial detachment. IRF - intraretinal fluid. SRF - subretinal fluid. EZ - ellipsoid zone. ERM - epiretinal membrane. ORA - outer retinal atrophy. ORT - outer retinal tubulation. SRHM - subretinal hyper-reflective material            ASSESSMENT/PLAN:    ICD-10-CM   1. Chorioretinal scar of left eye  H31.002 OCT, Retina - OU - Both Eyes    2. Early dry stage nonexudative age-related macular degeneration of both eyes  H35.3131 OCT, Retina - OU - Both Eyes    3. Retinal drusen of both eyes  H35.363     4. Retinal edema  H35.81     5. Diabetes mellitus type 2 without retinopathy ([place])  E11.9     6. Essential hypertension  I10     7. Hypertensive retinopathy of both eyes  H35.033     8. Pseudophakia of both eyes  Z96.1      1 Chorioretinal Scarring OS -- stable  - geographic patch of pigmented CR atrophy just outside IT arcades  - scattered patches of outer retinal atrophy  - per records, likely chronic and present on earliest exams in 2014 w/ Dr. Herbert Deaner  - no intervention indicated  - continue monitoring  - f/u 6 months   2,3. Age related macular degeneration, non-exudative, both eyes  - early dry stage  - BCVA improved post cataract surgery to 20/20 OU -- stable  - OCT shows OD: Interval increase in focal SRF superior to fovea, scattered small PED's,  Mild scattered drusen, Focal ellipsoid disruption nasal macula -- stable; OS: Focal ellipsoid disruption IN and temporal macula, larger geographic area of inner and outer retinal atrophy  IT midzone caught on widefield -- stable from prior, rare drusen   - FA 10.19.21 shows CNV superior macula corresponding to focal SRF OD  - discussed findings  - no intervention recommended as CNV remains minimally active, BCVA remains 20/20 and patient asymptomatic  - continue amsler grid monitoring  - f/u 6 months, sooner prn DFE, OCT  4. Diabetes mellitus, type 2 without retinopathy  - The incidence, risk factors for progression, natural history and treatment options for diabetic retinopathy  were discussed with patient.    - The need for close monitoring of blood glucose, blood pressure, and serum lipids, avoiding cigarette or any type of tobacco, and the need for long term follow up was also discussed with patient.   - f/u in 1 year, sooner prn  5,6. Hypertensive retinopathy OU  - discussed importance of tight BP control  - monitor   7. Pseudophakia OU  - s/p CEIOL OD on 12.14.20 and OS on 12.28.20 with Dr. Kathlen Mody.  - beautiful surgeries w/ uncorrected VA 20/20 OU  - pt extremely happy   Ophthalmic Meds Ordered this visit:  No orders of  the defined types were placed in this encounter.    Return in about 6 months (around 01/29/2022) for CR scarring OS/dry ARMD OU w/DFE&OCT.  There are no Patient Instructions on file for this visit.   Explained the diagnoses, plan, and follow up with the patient and they expressed understanding.  Patient expressed understanding of the importance of proper follow up care.   This document serves as a record of services personally performed by Gardiner Sleeper, MD, PhD. It was created on their behalf by Estill Bakes, COT an ophthalmic technician. The creation of this record is the provider's dictation and/or activities during the visit.    Electronically signed by: Estill Bakes, COT 1.6.23 @ 2:11 PM   Gardiner Sleeper, M.D., Ph.D. Diseases & Surgery of the Retina and [place] 08/01/2021  I have reviewed the  above documentation for accuracy and completeness, and I agree with the above. Gardiner Sleeper, M.D., Ph.D. 08/03/21 2:13 PM   Abbreviations: M myopia (nearsighted); A astigmatism; H hyperopia (farsighted); P presbyopia; Mrx spectacle prescription;  CTL contact lenses; OD right eye; OS left eye; OU both eyes  XT exotropia; ET esotropia; PEK punctate epithelial keratitis; PEE punctate epithelial erosions; DES dry eye syndrome; MGD meibomian gland dysfunction; ATs artificial tears; PFAT's preservative free artificial tears; [place] nuclear sclerotic cataract; PSC posterior subcapsular cataract; ERM epi-retinal membrane; PVD posterior vitreous detachment; RD retinal detachment; DM diabetes mellitus; DR diabetic retinopathy; NPDR non-proliferative diabetic retinopathy; PDR proliferative diabetic retinopathy; CSME clinically significant macular edema; DME diabetic macular edema; dbh dot blot hemorrhages; CWS cotton wool spot; POAG primary open angle glaucoma; C/D cup-to-disc ratio; HVF humphrey visual field; GVF goldmann visual field; OCT optical coherence tomography; IOP intraocular pressure; BRVO Branch retinal vein occlusion; CRVO central retinal vein occlusion; CRAO central retinal artery occlusion; BRAO branch retinal artery occlusion; RT retinal tear; SB scleral buckle; PPV pars plana vitrectomy; VH Vitreous hemorrhage; PRP panretinal laser photocoagulation; IVK intravitreal kenalog; VMT vitreomacular traction; MH Macular hole;  NVD neovascularization of the disc; NVE neovascularization elsewhere; AREDS age related eye disease study; ARMD age related macular degeneration; POAG primary open angle glaucoma; EBMD epithelial/anterior basement membrane dystrophy; ACIOL anterior chamber intraocular lens; IOL intraocular lens; PCIOL posterior chamber intraocular lens; Phaco/IOL phacoemulsification with intraocular lens placement; [place] photorefractive keratectomy; LASIK laser assisted in situ keratomileusis; HTN  hypertension; DM diabetes mellitus; COPD chronic obstructive pulmonary disease

## 2021-08-01 ENCOUNTER — Other Ambulatory Visit: Payer: Self-pay

## 2021-08-01 ENCOUNTER — Ambulatory Visit (INDEPENDENT_AMBULATORY_CARE_PROVIDER_SITE_OTHER): Payer: Medicare Other | Admitting: Ophthalmology

## 2021-08-01 ENCOUNTER — Encounter (INDEPENDENT_AMBULATORY_CARE_PROVIDER_SITE_OTHER): Payer: Self-pay | Admitting: Ophthalmology

## 2021-08-01 DIAGNOSIS — Z961 Presence of intraocular lens: Secondary | ICD-10-CM | POA: Diagnosis not present

## 2021-08-01 DIAGNOSIS — H31002 Unspecified chorioretinal scars, left eye: Secondary | ICD-10-CM

## 2021-08-01 DIAGNOSIS — H35033 Hypertensive retinopathy, bilateral: Secondary | ICD-10-CM

## 2021-08-01 DIAGNOSIS — H353131 Nonexudative age-related macular degeneration, bilateral, early dry stage: Secondary | ICD-10-CM

## 2021-08-01 DIAGNOSIS — E119 Type 2 diabetes mellitus without complications: Secondary | ICD-10-CM

## 2021-08-01 DIAGNOSIS — I10 Essential (primary) hypertension: Secondary | ICD-10-CM

## 2021-08-01 DIAGNOSIS — H35363 Drusen (degenerative) of macula, bilateral: Secondary | ICD-10-CM

## 2021-08-03 ENCOUNTER — Encounter (INDEPENDENT_AMBULATORY_CARE_PROVIDER_SITE_OTHER): Payer: Self-pay | Admitting: Ophthalmology

## 2021-08-06 DIAGNOSIS — E10319 Type 1 diabetes mellitus with unspecified diabetic retinopathy without macular edema: Secondary | ICD-10-CM | POA: Diagnosis not present

## 2021-08-06 DIAGNOSIS — E782 Mixed hyperlipidemia: Secondary | ICD-10-CM | POA: Diagnosis not present

## 2021-08-06 DIAGNOSIS — K603 Anal fistula: Secondary | ICD-10-CM | POA: Diagnosis not present

## 2021-08-06 DIAGNOSIS — N182 Chronic kidney disease, stage 2 (mild): Secondary | ICD-10-CM | POA: Diagnosis not present

## 2021-08-06 DIAGNOSIS — I10 Essential (primary) hypertension: Secondary | ICD-10-CM | POA: Diagnosis not present

## 2021-08-21 ENCOUNTER — Other Ambulatory Visit: Payer: Self-pay

## 2021-08-21 ENCOUNTER — Ambulatory Visit (INDEPENDENT_AMBULATORY_CARE_PROVIDER_SITE_OTHER): Payer: Medicare Other | Admitting: Orthopedic Surgery

## 2021-08-21 ENCOUNTER — Encounter: Payer: Self-pay | Admitting: Orthopedic Surgery

## 2021-08-21 DIAGNOSIS — S8265XD Nondisplaced fracture of lateral malleolus of left fibula, subsequent encounter for closed fracture with routine healing: Secondary | ICD-10-CM

## 2021-08-21 NOTE — Progress Notes (Signed)
Chief Complaint  Patient presents with   Ankle Injury    Left ankle fracture 03/02/21 improving     Encounter Diagnosis  Name Primary?   Nondisplaced fracture of lateral malleolus of left fibula, subsequent encounter for closed fracture with routine healing 03/02/21 Yes    76 year old male treated with a cast for Weber B type fracture came in for range of motion check 6 months after injury  Does complain of some chronic swelling  Seems to have chronic venous insufficiency with pigmentation changes in both lower extremities  More swelling left and right  Otherwise ankle motion is intact he does have some pes planus but that is chronic  Recommend routine activities follow-up as needed

## 2021-09-04 DIAGNOSIS — E782 Mixed hyperlipidemia: Secondary | ICD-10-CM | POA: Diagnosis not present

## 2021-09-04 DIAGNOSIS — I10 Essential (primary) hypertension: Secondary | ICD-10-CM | POA: Diagnosis not present

## 2021-09-04 DIAGNOSIS — E1165 Type 2 diabetes mellitus with hyperglycemia: Secondary | ICD-10-CM | POA: Diagnosis not present

## 2021-09-04 DIAGNOSIS — Z9641 Presence of insulin pump (external) (internal): Secondary | ICD-10-CM | POA: Diagnosis not present

## 2021-10-06 DIAGNOSIS — E10319 Type 1 diabetes mellitus with unspecified diabetic retinopathy without macular edema: Secondary | ICD-10-CM | POA: Diagnosis not present

## 2021-10-06 DIAGNOSIS — E782 Mixed hyperlipidemia: Secondary | ICD-10-CM | POA: Diagnosis not present

## 2021-10-06 DIAGNOSIS — R5383 Other fatigue: Secondary | ICD-10-CM | POA: Diagnosis not present

## 2021-10-06 DIAGNOSIS — N182 Chronic kidney disease, stage 2 (mild): Secondary | ICD-10-CM | POA: Diagnosis not present

## 2021-10-06 DIAGNOSIS — I10 Essential (primary) hypertension: Secondary | ICD-10-CM | POA: Diagnosis not present

## 2021-10-07 DIAGNOSIS — Z20822 Contact with and (suspected) exposure to covid-19: Secondary | ICD-10-CM | POA: Diagnosis not present

## 2021-10-13 DIAGNOSIS — K760 Fatty (change of) liver, not elsewhere classified: Secondary | ICD-10-CM | POA: Diagnosis not present

## 2021-10-13 DIAGNOSIS — E782 Mixed hyperlipidemia: Secondary | ICD-10-CM | POA: Diagnosis not present

## 2021-10-13 DIAGNOSIS — I10 Essential (primary) hypertension: Secondary | ICD-10-CM | POA: Diagnosis not present

## 2021-10-13 DIAGNOSIS — E10319 Type 1 diabetes mellitus with unspecified diabetic retinopathy without macular edema: Secondary | ICD-10-CM | POA: Diagnosis not present

## 2021-10-13 DIAGNOSIS — I700 Atherosclerosis of aorta: Secondary | ICD-10-CM | POA: Diagnosis not present

## 2021-10-13 DIAGNOSIS — Z0000 Encounter for general adult medical examination without abnormal findings: Secondary | ICD-10-CM | POA: Diagnosis not present

## 2021-11-21 DIAGNOSIS — Z6830 Body mass index (BMI) 30.0-30.9, adult: Secondary | ICD-10-CM | POA: Diagnosis not present

## 2021-11-21 DIAGNOSIS — M4712 Other spondylosis with myelopathy, cervical region: Secondary | ICD-10-CM | POA: Diagnosis not present

## 2021-11-22 DIAGNOSIS — Z20822 Contact with and (suspected) exposure to covid-19: Secondary | ICD-10-CM | POA: Diagnosis not present

## 2021-12-03 DIAGNOSIS — Z20822 Contact with and (suspected) exposure to covid-19: Secondary | ICD-10-CM | POA: Diagnosis not present

## 2022-01-28 NOTE — Progress Notes (Signed)
[facility] Note  01/30/2022     CHIEF COMPLAINT Patient presents for No chief complaint on file.  HISTORY OF PRESENT ILLNESS: Anthony [name] is a 76 y.o. male who presents to the clinic today for:   HPI   6 month follow up ARMD OU-  Eyes are excellent states pt.  BS 150 this morning and now A1C 7.3 Last edited by Leonie Douglas, [place] on 01/30/2022 12:45 PM.     Pt states no change in vision  Referring physician: Merrilee Seashore, [address],  [place] [address]  HISTORICAL INFORMATION:   Selected notes from the MEDICAL RECORD NUMBER Referred by Dr. Kathlen Mody for cat sx clearance   CURRENT MEDICATIONS: No current outpatient medications on file. (Ophthalmic Drugs)   No current facility-administered medications for this visit. (Ophthalmic Drugs)   Current Outpatient Medications (Other)  Medication Sig   amLODipine (NORVASC) 10 MG tablet Take 10 mg by mouth every morning.    cholecalciferol (VITAMIN D3) 25 MCG (1000 UNIT) tablet Take 1,000 Units by mouth daily.   colestipol (COLESTID) 1 g tablet 1 tablet   Evolocumab (REPATHA) 140 MG/ML SOSY Inject 140 mg into the skin every 14 (fourteen) days.   fosinopril (MONOPRIL) 10 MG tablet Take 10 mg by mouth daily.   furosemide (LASIX) 40 MG tablet Take 1 tablet by mouth once daily for 7 days   hydrochlorothiazide (HYDRODIURIL) 25 MG tablet 0.5 tablet   insulin aspart (NOVOLOG) 100 UNIT/ML injection Inject 30-45 Units into the skin daily as needed for high blood sugar. Via pump   LAGEVRIO 200 MG CAPS    lipase/protease/amylase (CREON) 36000 UNITS CPEP capsule Take 1 capsule (36,000 Units total) by mouth 3 (three) times daily before meals.   pantoprazole (PROTONIX) 40 MG tablet Take 40 mg by mouth every morning.    tamsulosin (FLOMAX) 0.4 MG CAPS capsule Take 1 capsule (0.4 mg total) by mouth daily as needed. For urinary urgency after prostate radiation. (Patient taking differently:  Take 0.4 mg by mouth daily.)   traZODone (DESYREL) 50 MG tablet Take 50 mg by mouth at bedtime.   No current facility-administered medications for this visit. (Other)   REVIEW OF SYSTEMS: ROS   Positive for: Gastrointestinal, Genitourinary, Endocrine, Eyes Negative for: Constitutional, Neurological, Skin, Musculoskeletal, HENT, Cardiovascular, Respiratory, Psychiatric, Allergic/Imm, Heme/Lymph Last edited by Leonie Douglas, COA on 01/30/2022 12:45 PM.      ALLERGIES Allergies  Allergen Reactions   Peanut Oil Anaphylaxis    Other reaction(s): anaphylaxis   Almond Oil Other (See Comments)    Positive allergy test   Shellfish Allergy Other (See Comments)    Positive allergy test Positive allergy test Positive allergy test   Atorvastatin Other (See Comments)    Reports elevated liver enzymes.   Other Other (See Comments)    Positive allergy test for peanuts and almonds   Silver Other (See Comments)   Toradol [Ketorolac Tromethamine] Other (See Comments)    Pt has chronic calcific pancreatitis   Zetia [Ezetimibe]    Tape Rash    Tegaderm  Tegaderm     PAST MEDICAL HISTORY Past Medical History:  Diagnosis Date   Agent orange exposure    Anxiety    At risk for sleep apnea    STOP BANG SCORE= 5             SENT TO PCP 11-24-2017   Chronic calcific pancreatitis ([place]) 2000   Colon cancer ([place])  09/ 2016  sigmoid cancer  s/p  left hemicolectomy (negative nodes and margins)   Depression    GERD (gastroesophageal reflux disease)    Hemorrhoids    History of adenomatous polyp of colon    Hyperlipidemia    Hyperplasia of prostate with lower urinary tract symptoms (LUTS)    Hypertension    Hypertensive retinopathy    Lipoma    Macular degeneration    Peripheral neuropathy    Prostate cancer [facility]) urologist-  dr Tresa Moore  oncologist-  dr Tammi Klippel   dx 08-02-2017-- Stage T1c, Gleason, 3+4, PSA 5.07, vol 38cc--  scheduled for radioactive seed implants 11-29-2017   Type 2 diabetes  mellitus with insulin therapy ([place])    Pt reports he is Type 1 Diabetic-diagnosed at age 55/  followed by pcp   Wears glasses    Past Surgical History:  Procedure Laterality Date   CATARACT EXTRACTION     COLONOSCOPY  last one 03-14-2015   CYSTOSCOPY N/A 11/29/2017   Procedure: CYSTOSCOPY;  Surgeon: Alexis Frock, MD;  Location: [facility];  Service: Urology;  Laterality: N/A;  no seeds in bladder per Dr Tresa Moore   EVALUATION UNDER ANESTHESIA WITH HEMORRHOIDECTOMY N/A 05/29/2015   Procedure: ANAL EXAM UNDER ANESTHESIA WITH  HEMORRHOIDOPEXY; RIGID SIGMOIDOSCOPY;  Surgeon: Leighton Ruff, MD;  Location: [facility];  Service: General;  Laterality: N/A;   EYE SURGERY     INCISION AND DRAINAGE PERIRECTAL ABSCESS N/A 11/27/2020   Procedure: IRRIGATION AND DEBRIDEMENT PERIRECTAL ABSCESS with drain placement and seton placement;  Surgeon: Virl Cagey, MD;  Location: [facility];  Service: General;  Laterality: N/A;   LAPAROSCOPIC PARTIAL COLECTOMY N/A 03/29/2015   Procedure: LAPAROSCOPIC SIGMOIDECTOMY;  Surgeon: Leighton Ruff, MD;  Location: [facility];  Service: General;  Laterality: N/A;   NASAL SEPTUM SURGERY  2006   PROSTATE BIOPSY  08-02-2017  dr Tresa Moore office   RADIOACTIVE SEED IMPLANT N/A 11/29/2017   Procedure: RADIOACTIVE SEED IMPLANT/BRACHYTHERAPY IMPLANT;  Surgeon: Alexis Frock, MD;  Location: [facility];  Service: Urology;  Laterality: N/A;   SPACE OAR INSTILLATION N/A 11/29/2017   Procedure: SPACE OAR INSTILLATION;  Surgeon: Alexis Frock, MD;  Location: [facility];  Service: Urology;  Laterality: N/A;   FAMILY HISTORY Family History  Problem Relation Age of Onset   Hyperlipidemia Father    Stroke Father    Prostate cancer Father    Heart attack Father    Diabetes Maternal Uncle    Cancer Brother        unknown blood cancer   Healthy Mother    SOCIAL HISTORY Social History   Tobacco Use   Smoking status:  Former    Years: 30.00    Types: Cigarettes    Quit date: 06/26/2009    Years since quitting: 12.6   Smokeless tobacco: Never  Vaping Use   Vaping Use: Never used  Substance Use Topics   Alcohol use: Not Currently   Drug use: No       OPHTHALMIC EXAM:  Base Eye Exam     Visual Acuity (Snellen - Linear)       Right Left   Dist [place] 20/20 20/20         Tonometry (Tonopen, 12:50 PM)       Right Left   Pressure 14 12         Pupils       Dark Light Shape React APD   Right 3 2 Round  Minimal None   Left 3 2 Round Minimal None         Visual Fields (Counting fingers)       Left Right    Full Full         Extraocular Movement       Right Left    Full Full         Neuro/Psych     Oriented x3: Yes   Mood/Affect: Normal         Dilation     Both eyes: 1.0% Mydriacyl, 2.5% Phenylephrine @ 12:50 PM           Slit Lamp and Fundus Exam     Slit Lamp Exam       Right Left   Lids/Lashes Dermatochalasis - upper lid, Ptosis Dermatochalasis - upper lid, mild Ptosis   Conjunctiva/Sclera Mild Melanosis, inferior scleral show Melanosis, temporal pinguecula, inferior scleral show   Cornea Arcus, well healed cataract wounds Arcus, well healed cataract wounds   Anterior Chamber deep and clear deep and clear   Iris Round and moderately dilated, TIDS @ 0800 and 0830--?LPI Round and moderately dilated   Lens PC IOL in excellent position PC IOL in excellent poistion   Vitreous Vitreous syneresis Vitreous syneresis         Fundus Exam       Right Left   Disc Trace pallor, sharp rim Mild central pallor, Sharp, +cupping   C/D Ratio 0.6 0.7   Macula good foveal reflex, Retinal pigment epithelial mottling, scattered Drusen, No heme.  Focal shallow SRF superior macula - slightly increased from prior. good foveal reflex, Retinal pigment epithelial mottling and clumping, Drusen, No heme or edema, RPE atrophy and mild ERM   Vessels Vascular attenuation, mild  Copper wiring, mild Tortuousity, AV crossing changes Vascular attenuation, mild Copper wiring, Tortuous, mild AV crossing changes   Periphery Attached, scattered RPE changes, peripheral drusen, No heme Attached, focal patch of CR scarring just outside IT arcades and inferiorly -- stable, scattered peripheral drusen, No heme            IMAGING AND PROCEDURES  Imaging and Procedures for '@TODAY'$ @          ASSESSMENT/PLAN:    ICD-10-CM   1. Chorioretinal scar of left eye  H31.002     2. Early dry stage nonexudative age-related macular degeneration of both eyes  H35.3131 OCT, Retina - OU - Both Eyes    3. Retinal drusen of both eyes  H35.363     4. Diabetes mellitus type 2 without retinopathy ([place])  E11.9     5. Essential hypertension  I10     6. Hypertensive retinopathy of both eyes  H35.033     7. Pseudophakia of both eyes  Z96.1       1 Chorioretinal Scarring OS -- stable  - geographic patch of pigmented CR atrophy just outside IT arcades  - scattered patches of outer retinal atrophy  - per records, likely chronic and present on earliest exams in 2014 w/ Dr. Herbert Deaner  - no intervention indicated  - continue monitoring   - f/u 6-9 months   2,3. Age related macular degeneration, non-exudative, both eyes  - early dry stage  - BCVA improved post cataract surgery to 20/20 OU -- stable  - OCT shows Interval improvement in focal SRF superior to fovea, scattered small PED's -- improved,  Mild scattered drusen, Focal ellipsoid disruption nasal macula -- stable; OS: Focal ellipsoid disruption IN and  temporal macula, larger geographic area of inner and outer retinal atrophy IT midzone caught on widefield -- stable from prior, rare drusen   - FA 10.19.21 shows CNV superior macula corresponding to focal SRF OD  - discussed findings  - no intervention recommended as CNV remains minimally active, BCVA remains 20/20 and patient asymptomatic  - continue amsler grid monitoring   - f/u  6-9 months, sooner prn DFE, OCT  4. Diabetes mellitus, type 2 without retinopathy  - The incidence, risk factors for progression, natural history and treatment options for diabetic retinopathy  were discussed with patient.    - The need for close monitoring of blood glucose, blood pressure, and serum lipids, avoiding cigarette or any type of tobacco, and the need for long term follow up was also discussed with patient.   - f/u in 1 year, sooner prn  5,6. Hypertensive retinopathy OU  - discussed importance of tight BP control  - monitor   7. Pseudophakia OU  - s/p CEIOL OD on 12.14.20 and OS on 12.28.20 with Dr. Kathlen Mody   - beautiful surgeries w/ uncorrected VA 20/20 OU  - pt extremely happy   Ophthalmic Meds Ordered this visit:  No orders of the defined types were placed in this encounter.    No follow-ups on file.  There are no Patient Instructions on file for this visit.   Explained the diagnoses, plan, and follow up with the patient and they expressed understanding.  Patient expressed understanding of the importance of proper follow up care.   This document serves as a record of services personally performed by Gardiner Sleeper, MD, PhD. It was created on their behalf by Leonie Douglas, an ophthalmic technician. The creation of this record is the provider's dictation and/or activities during the visit.    Electronically signed by: Leonie Douglas COA, 01/30/22  1:02 PM  This document serves as a record of services personally performed by Gardiner Sleeper, MD, PhD. It was created on their behalf by San Jetty. Owens Shark, OA an ophthalmic technician. The creation of this record is the provider's dictation and/or activities during the visit.    Electronically signed by: San Jetty. Owens Shark, [place] 07.07.2023 1:02 PM   Gardiner Sleeper, M.D., Ph.D. Diseases & Surgery of the Retina and Vitreous [facility]: M myopia (nearsighted); A astigmatism; H hyperopia  (farsighted); P presbyopia; Mrx spectacle prescription;  CTL contact lenses; OD right eye; OS left eye; OU both eyes  XT exotropia; ET esotropia; PEK punctate epithelial keratitis; PEE punctate epithelial erosions; DES dry eye syndrome; MGD meibomian gland dysfunction; ATs artificial tears; PFAT's preservative free artificial tears; [place] nuclear sclerotic cataract; PSC posterior subcapsular cataract; ERM epi-retinal membrane; PVD posterior vitreous detachment; RD retinal detachment; DM diabetes mellitus; DR diabetic retinopathy; NPDR non-proliferative diabetic retinopathy; PDR proliferative diabetic retinopathy; CSME clinically significant macular edema; DME diabetic macular edema; dbh dot blot hemorrhages; CWS cotton wool spot; POAG primary open angle glaucoma; C/D cup-to-disc ratio; HVF humphrey visual field; GVF goldmann visual field; OCT optical coherence tomography; IOP intraocular pressure; BRVO Branch retinal vein occlusion; CRVO central retinal vein occlusion; CRAO central retinal artery occlusion; BRAO branch retinal artery occlusion; RT retinal tear; SB scleral buckle; PPV pars plana vitrectomy; VH Vitreous hemorrhage; PRP panretinal laser photocoagulation; IVK intravitreal kenalog; VMT vitreomacular traction; MH Macular hole;  NVD neovascularization of the disc; NVE neovascularization elsewhere; AREDS age related eye disease study; ARMD age related macular degeneration; POAG primary open  angle glaucoma; EBMD epithelial/anterior basement membrane dystrophy; ACIOL anterior chamber intraocular lens; IOL intraocular lens; PCIOL posterior chamber intraocular lens; Phaco/IOL phacoemulsification with intraocular lens placement; [place] photorefractive keratectomy; LASIK laser assisted in situ keratomileusis; HTN hypertension; DM diabetes mellitus; COPD chronic obstructive pulmonary disease

## 2022-01-30 ENCOUNTER — Ambulatory Visit (INDEPENDENT_AMBULATORY_CARE_PROVIDER_SITE_OTHER): Payer: Medicare Other | Admitting: Ophthalmology

## 2022-01-30 ENCOUNTER — Encounter (INDEPENDENT_AMBULATORY_CARE_PROVIDER_SITE_OTHER): Payer: Self-pay | Admitting: Ophthalmology

## 2022-01-30 DIAGNOSIS — I10 Essential (primary) hypertension: Secondary | ICD-10-CM | POA: Diagnosis not present

## 2022-01-30 DIAGNOSIS — H31002 Unspecified chorioretinal scars, left eye: Secondary | ICD-10-CM | POA: Diagnosis not present

## 2022-01-30 DIAGNOSIS — Z961 Presence of intraocular lens: Secondary | ICD-10-CM | POA: Diagnosis not present

## 2022-01-30 DIAGNOSIS — H35033 Hypertensive retinopathy, bilateral: Secondary | ICD-10-CM

## 2022-01-30 DIAGNOSIS — E119 Type 2 diabetes mellitus without complications: Secondary | ICD-10-CM

## 2022-01-30 DIAGNOSIS — H35363 Drusen (degenerative) of macula, bilateral: Secondary | ICD-10-CM | POA: Diagnosis not present

## 2022-01-30 DIAGNOSIS — H353131 Nonexudative age-related macular degeneration, bilateral, early dry stage: Secondary | ICD-10-CM | POA: Diagnosis not present

## 2022-02-01 ENCOUNTER — Encounter (INDEPENDENT_AMBULATORY_CARE_PROVIDER_SITE_OTHER): Payer: Self-pay | Admitting: Ophthalmology

## 2022-03-04 DIAGNOSIS — E1165 Type 2 diabetes mellitus with hyperglycemia: Secondary | ICD-10-CM | POA: Diagnosis not present

## 2022-03-04 DIAGNOSIS — Z9641 Presence of insulin pump (external) (internal): Secondary | ICD-10-CM | POA: Diagnosis not present

## 2022-03-04 DIAGNOSIS — E782 Mixed hyperlipidemia: Secondary | ICD-10-CM | POA: Diagnosis not present

## 2022-03-04 DIAGNOSIS — I10 Essential (primary) hypertension: Secondary | ICD-10-CM | POA: Diagnosis not present

## 2022-03-25 ENCOUNTER — Telehealth: Payer: Self-pay | Admitting: *Deleted

## 2022-03-25 NOTE — Chronic Care Management (AMB) (Signed)
  Care Coordination  Outreach Note  03/25/2022 Name: Dakoda Laventure MRN: [id number] DOB: [birth year]   Care Coordination Outreach Attempts  An unsuccessful telephone outreach was attempted today to offer the patient information about available care coordination services as a benefit of their health plan.   Follow Up Plan:  Additional outreach attempts will be made to offer the patient care coordination information and services.   Encounter Outcome:  No Answer  [place]  Direct Dial: [phone]

## 2022-03-31 NOTE — Chronic Care Management (AMB) (Signed)
  Care Coordination  Outreach Note  03/31/2022 Name: Anthony [name] MRN: [id number] DOB: [birth year]   Care Coordination Outreach Attempts  A second unsuccessful outreach was attempted today to offer the patient with information about available care coordination services as a benefit of their health plan.     Follow Up Plan:  Additional outreach attempts will be made to offer the patient care coordination information and services.   Encounter Outcome:  No Answer  [place]  Direct Dial: [phone]

## 2022-04-06 NOTE — Chronic Care Management (AMB) (Signed)
  Care Coordination  Outreach Note  04/06/2022 Name: Anthony [name] MRN: [id number] DOB: [birth year]   Care Coordination Outreach Attempts: A third unsuccessful outreach was attempted today to offer the patient with information about available care coordination services as a benefit of their health plan.   Follow Up Plan:  No further outreach attempts will be made at this time. We have been unable to contact the patient to offer or enroll patient in care coordination services  Encounter Outcome:  No Answer  [place]: [phone]

## 2022-05-05 DIAGNOSIS — Z23 Encounter for immunization: Secondary | ICD-10-CM | POA: Diagnosis not present

## 2022-05-15 DIAGNOSIS — Z23 Encounter for immunization: Secondary | ICD-10-CM | POA: Diagnosis not present

## 2022-06-02 DIAGNOSIS — M542 Cervicalgia: Secondary | ICD-10-CM | POA: Diagnosis not present

## 2022-06-02 DIAGNOSIS — M4802 Spinal stenosis, cervical region: Secondary | ICD-10-CM | POA: Diagnosis not present

## 2022-09-24 ENCOUNTER — Encounter: Payer: Self-pay | Admitting: Radiology

## 2022-10-01 ENCOUNTER — Telehealth: Payer: Self-pay

## 2022-10-01 NOTE — Patient Outreach (Signed)
  Care Coordination   10/01/2022 Name: Anthony [name] MRN: [id number] DOB: 1946/03/09   Care Coordination Outreach Attempts:  An unsuccessful telephone outreach was attempted today to offer the patient information about available care coordination services as a benefit of their health plan.   Follow Up Plan:  Additional outreach attempts will be made to offer the patient care coordination information and services.   Encounter Outcome:  No Answer   Care Coordination Interventions:  No, not indicated    Jone Baseman, RN, MSN [place] Management Care Management Coordinator Direct Line [phone]

## 2022-11-11 ENCOUNTER — Encounter (INDEPENDENT_AMBULATORY_CARE_PROVIDER_SITE_OTHER): Payer: Medicare Other | Admitting: Ophthalmology

## 2022-11-11 DIAGNOSIS — H35033 Hypertensive retinopathy, bilateral: Secondary | ICD-10-CM

## 2022-11-11 DIAGNOSIS — E119 Type 2 diabetes mellitus without complications: Secondary | ICD-10-CM

## 2022-11-11 DIAGNOSIS — I10 Essential (primary) hypertension: Secondary | ICD-10-CM

## 2022-11-11 DIAGNOSIS — H35363 Drusen (degenerative) of macula, bilateral: Secondary | ICD-10-CM

## 2022-11-11 DIAGNOSIS — Z961 Presence of intraocular lens: Secondary | ICD-10-CM

## 2022-11-11 DIAGNOSIS — H353131 Nonexudative age-related macular degeneration, bilateral, early dry stage: Secondary | ICD-10-CM

## 2022-11-11 DIAGNOSIS — H31002 Unspecified chorioretinal scars, left eye: Secondary | ICD-10-CM

## 2023-01-08 NOTE — Progress Notes (Signed)
[facility] - Clinic Note  01/18/2023     CHIEF COMPLAINT Patient presents for Retina Follow Up  HISTORY OF PRESENT ILLNESS: Anthony [name] is a 77 y.o. male who presents to the clinic today for:   HPI     Retina Follow Up   Patient presents with  Dry AMD.  In both eyes.  This started 11 months ago.  Duration of 11 months.  Since onset it is stable.  I, the attending physician,  performed the HPI with the patient and updated documentation appropriately.        Comments   11 month retina follow up ARMD pt is reporting no vision changes noticed pt is reporting floaters but denies any flashes pt is using his amsler grid and reports he has not noticed any changes noticed        Last edited by Rennis Chris, MD on 01/18/2023  4:13 PM.    Pt states no change in vision, but he has noticed a new floater in his right eye, no fol, pt states he has started eating better and is using less insulin  Referring physician: Georgianne Fick, MD [address] [place],  [place] [address]  HISTORICAL INFORMATION:   Selected notes from the MEDICAL RECORD NUMBER Referred by Dr. Alben Spittle for cat sx clearance   CURRENT MEDICATIONS: No current outpatient medications on file. (Ophthalmic Drugs)   No current facility-administered medications for this visit. (Ophthalmic Drugs)   Current Outpatient Medications (Other)  Medication Sig   amLODipine (NORVASC) 10 MG tablet Take 10 mg by mouth every morning.    cholecalciferol (VITAMIN D3) 25 MCG (1000 UNIT) tablet Take 1,000 Units by mouth daily.   colestipol (COLESTID) 1 g tablet 1 tablet   Evolocumab (REPATHA) 140 MG/ML SOSY Inject 140 mg into the skin every 14 (fourteen) days.   fosinopril (MONOPRIL) 10 MG tablet Take 10 mg by mouth daily.   furosemide (LASIX) 40 MG tablet Take 1 tablet by mouth once daily for 7 days   hydrochlorothiazide (HYDRODIURIL) 25 MG tablet 0.5 tablet   insulin aspart (NOVOLOG) 100 UNIT/ML  injection Inject 30-45 Units into the skin daily as needed for high blood sugar. Via pump   LAGEVRIO 200 MG CAPS    lipase/protease/amylase (CREON) 36000 UNITS CPEP capsule Take 1 capsule (36,000 Units total) by mouth 3 (three) times daily before meals.   pantoprazole (PROTONIX) 40 MG tablet Take 40 mg by mouth every morning.    tamsulosin (FLOMAX) 0.4 MG CAPS capsule Take 1 capsule (0.4 mg total) by mouth daily as needed. For urinary urgency after prostate radiation. (Patient taking differently: Take 0.4 mg by mouth daily.)   traZODone (DESYREL) 50 MG tablet Take 50 mg by mouth at bedtime.   No current facility-administered medications for this visit. (Other)   REVIEW OF SYSTEMS: ROS   Positive for: Gastrointestinal, Genitourinary, Endocrine, Eyes Negative for: Constitutional, Neurological, Skin, Musculoskeletal, HENT, Cardiovascular, Respiratory, Psychiatric, Allergic/Imm, Heme/Lymph Last edited by Etheleen Mayhew, COT on 01/18/2023  1:14 PM.       ALLERGIES Allergies  Allergen Reactions   Peanut Oil Anaphylaxis    Other reaction(s): anaphylaxis   Almond Oil Other (See Comments)    Positive allergy test   Shellfish Allergy Other (See Comments)    Positive allergy test Positive allergy test Positive allergy test   Atorvastatin Other (See Comments)    Reports elevated liver enzymes.   Other Other (See Comments)    Positive allergy  test for peanuts and almonds   Silver Other (See Comments)   Toradol [Ketorolac Tromethamine] Other (See Comments)    Pt has chronic calcific pancreatitis   Zetia [Ezetimibe]    Tape Rash    Tegaderm  Tegaderm     PAST MEDICAL HISTORY Past Medical History:  Diagnosis Date   Agent orange exposure    Anxiety    At risk for sleep apnea    STOP BANG SCORE= 5             SENT TO PCP 11-24-2017   Chronic calcific pancreatitis (HCC) 2000   Colon cancer (HCC)    09/ 2016  sigmoid cancer  s/p  left hemicolectomy (negative nodes and margins)    Depression    GERD (gastroesophageal reflux disease)    Hemorrhoids    History of adenomatous polyp of colon    Hyperlipidemia    Hyperplasia of prostate with lower urinary tract symptoms (LUTS)    Hypertension    Hypertensive retinopathy    Lipoma    Macular degeneration    Peripheral neuropathy    Prostate cancer [facility]) urologist-  dr Berneice Heinrich  oncologist-  dr Kathrynn Running   dx 08-02-2017-- Stage T1c, Gleason, 3+4, PSA 5.07, vol 38cc--  scheduled for radioactive seed implants 11-29-2017   Type 2 diabetes mellitus with insulin therapy (HCC)    Pt reports he is Type 1 Diabetic-diagnosed at age 66/  followed by pcp   Wears glasses    Past Surgical History:  Procedure Laterality Date   CATARACT EXTRACTION     COLONOSCOPY  last one 03-14-2015   CYSTOSCOPY N/A 11/29/2017   Procedure: CYSTOSCOPY;  Surgeon: Sebastian Ache, MD;  Location: [facility];  Service: Urology;  Laterality: N/A;  no seeds in bladder per Dr Berneice Heinrich   EVALUATION UNDER ANESTHESIA WITH HEMORRHOIDECTOMY N/A 05/29/2015   Procedure: ANAL EXAM UNDER ANESTHESIA WITH  HEMORRHOIDOPEXY; RIGID SIGMOIDOSCOPY;  Surgeon: Romie Levee, MD;  Location: [facility];  Service: General;  Laterality: N/A;   EYE SURGERY     INCISION AND DRAINAGE PERIRECTAL ABSCESS N/A 11/27/2020   Procedure: IRRIGATION AND DEBRIDEMENT PERIRECTAL ABSCESS with drain placement and seton placement;  Surgeon: Lucretia Roers, MD;  Location: [facility];  Service: General;  Laterality: N/A;   LAPAROSCOPIC PARTIAL COLECTOMY N/A 03/29/2015   Procedure: LAPAROSCOPIC SIGMOIDECTOMY;  Surgeon: Romie Levee, MD;  Location: [facility];  Service: General;  Laterality: N/A;   NASAL SEPTUM SURGERY  2006   PROSTATE BIOPSY  08-02-2017  dr Berneice Heinrich office   RADIOACTIVE SEED IMPLANT N/A 11/29/2017   Procedure: RADIOACTIVE SEED IMPLANT/BRACHYTHERAPY IMPLANT;  Surgeon: Sebastian Ache, MD;  Location: [facility];  Service: Urology;  Laterality:  N/A;   SPACE OAR INSTILLATION N/A 11/29/2017   Procedure: SPACE OAR INSTILLATION;  Surgeon: Sebastian Ache, MD;  Location: [facility];  Service: Urology;  Laterality: N/A;   FAMILY HISTORY Family History  Problem Relation Age of Onset   Hyperlipidemia Father    Stroke Father    Prostate cancer Father    Heart attack Father    Diabetes Maternal Uncle    Cancer Brother        unknown blood cancer   Healthy Mother    SOCIAL HISTORY Social History   Tobacco Use   Smoking status: Former    Years: 30    Types: Cigarettes    Quit date: 06/26/2009    Years since quitting: 13.5   Smokeless tobacco: Never  Vaping Use   Vaping Use: Never used  Substance Use Topics   Alcohol use: Not Currently   Drug use: No       OPHTHALMIC EXAM:  Base Eye Exam     Visual Acuity (Snellen - Linear)       Right Left   Dist [place] 20/20 20/20 -1         Tonometry (Tonopen, 1:20 PM)       Right Left   Pressure 10 11         Pupils       Pupils Dark Light Shape React APD   Right PERRL 3 2 Round Brisk None   Left PERRL 3 2 Round Brisk None         Visual Fields       Left Right    Full Full         Extraocular Movement       Right Left    Full, Ortho Full, Ortho         Neuro/Psych     Oriented x3: Yes   Mood/Affect: Normal         Dilation     Both eyes: 2.5% Phenylephrine @ 1:20 PM           Slit Lamp and Fundus Exam     Slit Lamp Exam       Right Left   Lids/Lashes Dermatochalasis - upper lid, Ptosis Dermatochalasis - upper lid, mild Ptosis   Conjunctiva/Sclera Mild Melanosis, inferior scleral show Melanosis, temporal pinguecula, inferior scleral show   Cornea Arcus, well healed cataract wounds Arcus, well healed cataract wounds   Anterior Chamber deep and clear deep and clear   Iris Round and dilated, TIDs at 0800 and 0830--LPI Round and dilated, closed PI at 0400   Lens PC IOL in excellent position PC IOL in excellent poistion    Anterior Vitreous mild syneresis, Posterior vitreous detachment, Weiss ring Vitreous syneresis         Fundus Exam       Right Left   Disc Trace pallor, sharp rim Mild central pallor, Sharp, +cupping, PPA   C/D Ratio 0.6 0.7   Macula good foveal reflex, Retinal pigment epithelial mottling, scattered Drusen, No heme, focal shallow SRF superior macula -- improved good foveal reflex, Retinal pigment epithelial mottling and clumping, Drusen, No heme or edema, RPE atrophy and mild ERM   Vessels attenuated, Tortuous attenuated, mild Copper wiring, Tortuous, mild AV crossing changes   Periphery Attached, scattered RPE changes, peripheral drusen, No heme Attached, focal patch of CR scarring just outside IT arcades and inferiorly -- stable, scattered peripheral drusen, No heme           Refraction     Wearing Rx     Type: SVL            IMAGING AND PROCEDURES  Imaging and Procedures for @TODAY @  OCT, Retina - OU - Both Eyes       Right Eye Quality was good. Central Foveal Thickness: 249. Progression has improved. Findings include normal foveal contour, no IRF, no SRF, retinal drusen , pigment epithelial detachment, outer retinal atrophy (Stable improvement in focal SRF superior to fovea -- left with focal ORA, scattered small PED's -- stably improved, Mild scattered drusen, Focal ORA nasal macula, interval release of partial PVD).   Left Eye Quality was good. Central Foveal Thickness: 250. Progression has been stable. Findings include normal foveal contour, no IRF, no SRF,  retinal drusen , pigment epithelial detachment, outer retinal atrophy, vitreomacular adhesion (Focal ellipsoid disruption IN and temporal macula, larger geographic area of inner and outer retinal atrophy IT midzone caught on widefield -- stable from prior, rare drusen).   Notes *Images captured and stored on drive  Diagnosis / Impression:  NFP, no IRF OU +retinal drusen OU -- non-exudative ARMD OD: Stable  improvement in focal SRF superior to fovea -- left with focal ORA, scattered small PED's -- stably improved,  Mild scattered drusen, Focal ORA nasal macula, interval release of partial PVD OS: Focal ellipsoid disruption IN and temporal macula, larger geographic area of inner and outer retinal atrophy IT midzone caught on widefield -- stable from prior, rare drusen  Clinical management:  See below  Abbreviations: NFP - Normal foveal profile. CME - cystoid macular edema. PED - pigment epithelial detachment. IRF - intraretinal fluid. SRF - subretinal fluid. EZ - ellipsoid zone. ERM - epiretinal membrane. ORA - outer retinal atrophy. ORT - outer retinal tubulation. SRHM - subretinal hyper-reflective material            ASSESSMENT/PLAN:    ICD-10-CM   1. Chorioretinal scar of left eye  H31.002     2. Early dry stage nonexudative age-related macular degeneration of both eyes  H35.3131 OCT, Retina - OU - Both Eyes    3. Retinal drusen of both eyes  H35.363     4. Diabetes mellitus type 2 without retinopathy (HCC)  E11.9     5. Current use of insulin (HCC)  Z79.4     6. Essential hypertension  I10     7. Hypertensive retinopathy of both eyes  H35.033     8. Pseudophakia of both eyes  Z96.1      1 Chorioretinal Scarring OS -- stable  - geographic patch of pigmented CR atrophy just outside IT arcades  - scattered patches of outer retinal atrophy  - per records, likely chronic and present on earliest exams in 2014 w/ Dr. Elmer Picker  - no intervention indicated  - continue monitoring   - f/u 1 yr  2,3. Age related macular degeneration, non-exudative, both eyes  - early dry stage  - BCVA improved post cataract surgery to 20/20 OU -- stable  - OCT shows OD: Stable improvement in focal SRF superior to fovea -- left with focal ORA, scattered small PED's -- stably improved,  Mild scattered drusen, Focal ORA nasal macula, interval release of partial PVD; OS: Focal ellipsoid disruption IN and  temporal macula, larger geographic area of inner and outer retinal atrophy IT midzone caught on widefield -- stable from prior, rare drusen  - FA 10.19.21 shows CNV superior macula corresponding to focal SRF OD  - discussed findings  - no intervention recommended BCVA remains 20/20 and patient asymptomatic; no fluid on OCT  - continue amsler grid monitoring   - f/u 1 yr, sooner prn DFE, OCT  4,5. Diabetes mellitus, type 2 without retinopathy  - The incidence, risk factors for progression, natural history and treatment options for diabetic retinopathy  were discussed with patient.    - The need for close monitoring of blood glucose, blood pressure, and serum lipids, avoiding cigarette or any type of tobacco, and the need for long term follow up was also discussed with patient.   - f/u in 1 year, sooner prn  6,7. Hypertensive retinopathy OU  - discussed importance of tight BP control  - monitor   8. Pseudophakia OU  - s/p CEIOL OD  on 12.14.20 and OS on 12.28.20 with Dr. Alben Spittle   - beautiful surgeries w/ uncorrected VA 20/20 OU  - pt extremely happy   Ophthalmic Meds Ordered this visit:  No orders of the defined types were placed in this encounter.    Return in about 1 year (around 01/18/2024) for f/u non-exu ARMD OU, DFE, OCT.  There are no Patient Instructions on file for this visit.   Explained the diagnoses, plan, and follow up with the patient and they expressed understanding.  Patient expressed understanding of the importance of proper follow up care.   This document serves as a record of services personally performed by Karie Chimera, MD, PhD. It was created on their behalf by De Blanch, an ophthalmic technician. The creation of this record is the provider's dictation and/or activities during the visit.    Electronically signed by: De Blanch, OA, 01/18/23  4:13 PM  Karie Chimera, M.D., Ph.D. Diseases & Surgery of the Retina and Vitreous [facility]  I have reviewed the above documentation for accuracy and completeness, and I agree with the above. Karie Chimera, M.D., Ph.D. 01/18/23 4:16 PM   Abbreviations: M myopia (nearsighted); A astigmatism; H hyperopia (farsighted); P presbyopia; Mrx spectacle prescription;  CTL contact lenses; OD right eye; OS left eye; OU both eyes  XT exotropia; ET esotropia; PEK punctate epithelial keratitis; PEE punctate epithelial erosions; DES dry eye syndrome; MGD meibomian gland dysfunction; ATs artificial tears; PFAT's preservative free artificial tears; NSC nuclear sclerotic cataract; PSC posterior subcapsular cataract; ERM epi-retinal membrane; PVD posterior vitreous detachment; RD retinal detachment; DM diabetes mellitus; DR diabetic retinopathy; NPDR non-proliferative diabetic retinopathy; PDR proliferative diabetic retinopathy; CSME clinically significant macular edema; DME diabetic macular edema; dbh dot blot hemorrhages; CWS cotton wool spot; POAG primary open angle glaucoma; C/D cup-to-disc ratio; HVF humphrey visual field; GVF goldmann visual field; OCT optical coherence tomography; IOP intraocular pressure; BRVO Branch retinal vein occlusion; CRVO central retinal vein occlusion; CRAO central retinal artery occlusion; BRAO branch retinal artery occlusion; RT retinal tear; SB scleral buckle; PPV pars plana vitrectomy; VH Vitreous hemorrhage; PRP panretinal laser photocoagulation; IVK intravitreal kenalog; VMT vitreomacular traction; MH Macular hole;  NVD neovascularization of the disc; NVE neovascularization elsewhere; AREDS age related eye disease study; ARMD age related macular degeneration; POAG primary open angle glaucoma; EBMD epithelial/anterior basement membrane dystrophy; ACIOL anterior chamber intraocular lens; IOL intraocular lens; PCIOL posterior chamber intraocular lens; Phaco/IOL phacoemulsification with intraocular lens placement; PRK photorefractive keratectomy; LASIK laser  assisted in situ keratomileusis; HTN hypertension; DM diabetes mellitus; COPD chronic obstructive pulmonary disease

## 2023-01-18 ENCOUNTER — Encounter (INDEPENDENT_AMBULATORY_CARE_PROVIDER_SITE_OTHER): Payer: Self-pay | Admitting: Ophthalmology

## 2023-01-18 ENCOUNTER — Ambulatory Visit (INDEPENDENT_AMBULATORY_CARE_PROVIDER_SITE_OTHER): Payer: Medicare Other | Admitting: Ophthalmology

## 2023-01-18 DIAGNOSIS — H35363 Drusen (degenerative) of macula, bilateral: Secondary | ICD-10-CM

## 2023-01-18 DIAGNOSIS — Z794 Long term (current) use of insulin: Secondary | ICD-10-CM

## 2023-01-18 DIAGNOSIS — H353131 Nonexudative age-related macular degeneration, bilateral, early dry stage: Secondary | ICD-10-CM

## 2023-01-18 DIAGNOSIS — H31002 Unspecified chorioretinal scars, left eye: Secondary | ICD-10-CM | POA: Diagnosis not present

## 2023-01-18 DIAGNOSIS — Z961 Presence of intraocular lens: Secondary | ICD-10-CM | POA: Diagnosis not present

## 2023-01-18 DIAGNOSIS — I10 Essential (primary) hypertension: Secondary | ICD-10-CM

## 2023-01-18 DIAGNOSIS — E119 Type 2 diabetes mellitus without complications: Secondary | ICD-10-CM

## 2023-01-18 DIAGNOSIS — H35033 Hypertensive retinopathy, bilateral: Secondary | ICD-10-CM | POA: Diagnosis not present

## 2023-03-15 DIAGNOSIS — G894 Chronic pain syndrome: Secondary | ICD-10-CM | POA: Diagnosis not present

## 2023-03-15 DIAGNOSIS — M545 Low back pain, unspecified: Secondary | ICD-10-CM | POA: Diagnosis not present

## 2023-03-15 DIAGNOSIS — N390 Urinary tract infection, site not specified: Secondary | ICD-10-CM | POA: Diagnosis not present

## 2023-04-10 DIAGNOSIS — Z23 Encounter for immunization: Secondary | ICD-10-CM | POA: Diagnosis not present

## 2023-10-01 DIAGNOSIS — E119 Type 2 diabetes mellitus without complications: Secondary | ICD-10-CM | POA: Diagnosis not present

## 2023-10-01 DIAGNOSIS — Z794 Long term (current) use of insulin: Secondary | ICD-10-CM | POA: Diagnosis not present

## 2023-10-01 DIAGNOSIS — Z6825 Body mass index (BMI) 25.0-25.9, adult: Secondary | ICD-10-CM | POA: Diagnosis not present

## 2023-10-01 DIAGNOSIS — K317 Polyp of stomach and duodenum: Secondary | ICD-10-CM | POA: Diagnosis not present

## 2023-10-01 DIAGNOSIS — Z8546 Personal history of malignant neoplasm of prostate: Secondary | ICD-10-CM | POA: Diagnosis not present

## 2023-10-01 DIAGNOSIS — K862 Cyst of pancreas: Secondary | ICD-10-CM | POA: Diagnosis not present

## 2023-10-01 DIAGNOSIS — I2699 Other pulmonary embolism without acute cor pulmonale: Secondary | ICD-10-CM | POA: Diagnosis not present

## 2023-10-01 DIAGNOSIS — Z87891 Personal history of nicotine dependence: Secondary | ICD-10-CM | POA: Diagnosis not present

## 2023-10-01 DIAGNOSIS — K219 Gastro-esophageal reflux disease without esophagitis: Secondary | ICD-10-CM | POA: Diagnosis not present

## 2023-10-01 DIAGNOSIS — Z79899 Other long term (current) drug therapy: Secondary | ICD-10-CM | POA: Diagnosis not present

## 2023-10-01 DIAGNOSIS — Z8719 Personal history of other diseases of the digestive system: Secondary | ICD-10-CM | POA: Diagnosis not present

## 2023-10-01 DIAGNOSIS — K8689 Other specified diseases of pancreas: Secondary | ICD-10-CM | POA: Diagnosis not present

## 2023-10-01 DIAGNOSIS — R933 Abnormal findings on diagnostic imaging of other parts of digestive tract: Secondary | ICD-10-CM | POA: Diagnosis not present

## 2023-10-01 DIAGNOSIS — R634 Abnormal weight loss: Secondary | ICD-10-CM | POA: Diagnosis not present

## 2023-10-01 DIAGNOSIS — K3189 Other diseases of stomach and duodenum: Secondary | ICD-10-CM | POA: Diagnosis not present

## 2023-10-01 DIAGNOSIS — E1169 Type 2 diabetes mellitus with other specified complication: Secondary | ICD-10-CM | POA: Diagnosis not present

## 2023-10-01 DIAGNOSIS — Z85038 Personal history of other malignant neoplasm of large intestine: Secondary | ICD-10-CM | POA: Diagnosis not present

## 2023-10-01 DIAGNOSIS — I10 Essential (primary) hypertension: Secondary | ICD-10-CM | POA: Diagnosis not present

## 2023-10-01 DIAGNOSIS — Z86711 Personal history of pulmonary embolism: Secondary | ICD-10-CM | POA: Diagnosis not present

## 2023-10-01 DIAGNOSIS — Z7901 Long term (current) use of anticoagulants: Secondary | ICD-10-CM | POA: Diagnosis not present

## 2023-10-01 DIAGNOSIS — K861 Other chronic pancreatitis: Secondary | ICD-10-CM | POA: Diagnosis not present

## 2023-10-01 DIAGNOSIS — K2940 Chronic atrophic gastritis without bleeding: Secondary | ICD-10-CM | POA: Diagnosis not present

## 2024-01-18 ENCOUNTER — Encounter (INDEPENDENT_AMBULATORY_CARE_PROVIDER_SITE_OTHER): Payer: Medicare Other | Admitting: Ophthalmology

## 2024-04-10 DIAGNOSIS — R918 Other nonspecific abnormal finding of lung field: Secondary | ICD-10-CM | POA: Diagnosis not present

## 2024-04-10 DIAGNOSIS — E162 Hypoglycemia, unspecified: Secondary | ICD-10-CM | POA: Diagnosis not present

## 2024-04-10 DIAGNOSIS — R079 Chest pain, unspecified: Secondary | ICD-10-CM | POA: Diagnosis not present
